# Patient Record
Sex: Female | Born: 1945
Health system: Southern US, Community
[De-identification: ages and names within clinical notes are randomized; demographics above are authoritative.]

## PROBLEM LIST (undated history)

## (undated) DIAGNOSIS — I4819 Other persistent atrial fibrillation: Secondary | ICD-10-CM

## (undated) DIAGNOSIS — E785 Hyperlipidemia, unspecified: Secondary | ICD-10-CM

## (undated) DIAGNOSIS — R51 Headache: Secondary | ICD-10-CM

## (undated) DIAGNOSIS — K5900 Constipation, unspecified: Secondary | ICD-10-CM

## (undated) DIAGNOSIS — J301 Allergic rhinitis due to pollen: Secondary | ICD-10-CM

## (undated) DIAGNOSIS — R519 Headache, unspecified: Secondary | ICD-10-CM

## (undated) DIAGNOSIS — M1990 Unspecified osteoarthritis, unspecified site: Secondary | ICD-10-CM

## (undated) DIAGNOSIS — M7730 Calcaneal spur, unspecified foot: Secondary | ICD-10-CM

## (undated) DIAGNOSIS — I10 Essential (primary) hypertension: Secondary | ICD-10-CM

## (undated) HISTORY — PX: ABDOMINAL HYSTERECTOMY: SHX81

## (undated) HISTORY — DX: Headache: R51

## (undated) HISTORY — PX: COLONOSCOPY: SHX174

## (undated) HISTORY — DX: Essential (primary) hypertension: I10

## (undated) HISTORY — DX: Allergic rhinitis due to pollen: J30.1

## (undated) HISTORY — DX: Other persistent atrial fibrillation: I48.19

## (undated) HISTORY — PX: HEMORRHOID SURGERY: SHX153

## (undated) HISTORY — DX: Headache, unspecified: R51.9

## (undated) HISTORY — DX: Hyperlipidemia, unspecified: E78.5

## (undated) HISTORY — PX: TOOTH EXTRACTION: SUR596

---

## 1981-02-02 HISTORY — PX: BACK SURGERY: SHX140

## 2005-05-06 ENCOUNTER — Ambulatory Visit: Payer: Self-pay | Admitting: Nurse Practitioner

## 2007-11-03 ENCOUNTER — Ambulatory Visit: Payer: Self-pay | Admitting: *Deleted

## 2009-05-21 LAB — HM COLONOSCOPY: HM Colonoscopy: NEGATIVE

## 2009-07-10 ENCOUNTER — Ambulatory Visit: Payer: Self-pay | Admitting: Gastroenterology

## 2009-07-10 LAB — HM COLONOSCOPY: HM COLON: NEGATIVE

## 2009-07-17 ENCOUNTER — Ambulatory Visit: Payer: Self-pay | Admitting: *Deleted

## 2010-05-21 ENCOUNTER — Encounter: Payer: Self-pay | Admitting: *Deleted

## 2010-06-03 ENCOUNTER — Encounter: Payer: Self-pay | Admitting: *Deleted

## 2010-07-30 ENCOUNTER — Ambulatory Visit: Payer: Self-pay | Admitting: *Deleted

## 2011-10-05 ENCOUNTER — Emergency Department: Payer: Self-pay | Admitting: Emergency Medicine

## 2011-12-22 ENCOUNTER — Ambulatory Visit: Payer: Self-pay | Admitting: *Deleted

## 2012-02-24 ENCOUNTER — Encounter: Payer: Self-pay | Admitting: *Deleted

## 2012-03-05 ENCOUNTER — Encounter: Payer: Self-pay | Admitting: *Deleted

## 2012-04-02 ENCOUNTER — Encounter: Payer: Self-pay | Admitting: *Deleted

## 2013-03-23 ENCOUNTER — Encounter: Payer: Self-pay | Admitting: Family Medicine

## 2013-03-23 ENCOUNTER — Ambulatory Visit (INDEPENDENT_AMBULATORY_CARE_PROVIDER_SITE_OTHER): Payer: Medicare HMO | Admitting: Family Medicine

## 2013-03-23 VITALS — BP 124/78 | HR 75 | Temp 97.7°F | Ht 63.0 in | Wt 159.0 lb

## 2013-03-23 DIAGNOSIS — Z1239 Encounter for other screening for malignant neoplasm of breast: Secondary | ICD-10-CM

## 2013-03-23 DIAGNOSIS — G5600 Carpal tunnel syndrome, unspecified upper limb: Secondary | ICD-10-CM | POA: Insufficient documentation

## 2013-03-23 DIAGNOSIS — I10 Essential (primary) hypertension: Secondary | ICD-10-CM | POA: Insufficient documentation

## 2013-03-23 DIAGNOSIS — E785 Hyperlipidemia, unspecified: Secondary | ICD-10-CM | POA: Insufficient documentation

## 2013-03-23 DIAGNOSIS — M1990 Unspecified osteoarthritis, unspecified site: Secondary | ICD-10-CM

## 2013-03-23 LAB — COMPREHENSIVE METABOLIC PANEL
ALT: 24 U/L (ref 0–35)
AST: 25 U/L (ref 0–37)
Albumin: 4.5 g/dL (ref 3.5–5.2)
Alkaline Phosphatase: 63 U/L (ref 39–117)
BUN: 31 mg/dL — ABNORMAL HIGH (ref 6–23)
CALCIUM: 10.1 mg/dL (ref 8.4–10.5)
CHLORIDE: 99 meq/L (ref 96–112)
CO2: 28 meq/L (ref 19–32)
CREATININE: 1.1 mg/dL (ref 0.4–1.2)
GFR: 53.99 mL/min — AB (ref >60.00)
Glucose, Bld: 95 mg/dL (ref 70–99)
Potassium: 3.7 mEq/L (ref 3.5–5.1)
Sodium: 137 mEq/L (ref 135–145)
TOTAL PROTEIN: 7.7 g/dL (ref 6.0–8.3)
Total Bilirubin: 0.5 mg/dL (ref 0.3–1.2)

## 2013-03-23 LAB — LIPID PANEL
Cholesterol: 205 mg/dL — ABNORMAL HIGH (ref 0–200)
HDL: 55.8 mg/dL (ref >39.00)
Total CHOL/HDL Ratio: 4
Triglycerides: 177 mg/dL — ABNORMAL HIGH (ref 0.0–149.0)
VLDL: 35.4 mg/dL (ref 0.0–40.0)

## 2013-03-23 NOTE — Assessment & Plan Note (Signed)
Followed by Dr. Sabra Heck.

## 2013-03-23 NOTE — Progress Notes (Signed)
   Subjective:   Patient ID: Monica Lindsey, female    DOB: [birth year], 68 y.o.   MRN: [id number]  Monica Lindsey is a pleasant 68 y.o. year old female who presentsyear old female who presents to clinic today with [place]  on 03/23/2013  HPI: HTN- on HCTZ 25 mg and amlodipine 5 mg daily- not taking any potassium supplementation. Eats a lot of bananas.  HLD- on pravastatin 40 mg daily.  Denies myalgias.  Tries to eat right- eats a lot of fruits and vegetables.  Widowed.  Still working 3 days per week.  Very close to her children and grand children.  Patient Active Problem List   Diagnosis Date Noted  . HTN (hypertension) 03/23/2013  . HLD (hyperlipidemia) 03/23/2013  . OA (osteoarthritis) 03/23/2013  . Carpal tunnel syndrome 03/23/2013   Past Medical History  Diagnosis Date  . Frequent headaches   . Hay fever   . Hypertension   . Hyperlipidemia    Past Surgical History  Procedure Laterality Date  . Back surgery  1983  . Hemorrhoid surgery    . Abdominal hysterectomy    . Tooth extraction     History  Substance Use Topics  . Smoking status: Never Smoker   . Smokeless tobacco: Never Used  . Alcohol Use: No   Family History  Problem Relation Age of Onset  . Heart disease Mother   . Alcohol abuse Father   . Arthritis Father   . Heart disease Father   . Stroke Father   . Hypertension Father    Allergies  Allergen Reactions  . Demerol [Meperidine] Other (See Comments)    Makes her "looney"   No current outpatient prescriptions on file prior to visit.   No current facility-administered medications on file prior to visit.   The PMH, PSH, Social History, Family History, Medications, and allergies have been reviewed in [facility], and have been updated if relevant.    Review of Systems See HPI No CP or SOB No LE edema No HA    Objective:    BP 124/78  Pulse 75  Temp(Src) 97.7 F (36.5 C) (Oral)  Ht 5\' 3"  (1.6 m)  Wt 159 lb (72.122 kg)  BMI 28.17 kg/m2  SpO2 97%   Physical  Exam  General:  Well-developed,well-nourished,in no acute distress; alert,appropriate and cooperative throughout examination Head:  normocephalic and atraumatic.   Eyes:  vision grossly intact, pupils equal, pupils round, and pupils reactive to light.   Ears:  R ear normal and L ear normal.   Lungs:  Normal respiratory effort, chest expands symmetrically. Lungs are clear to auscultation, no crackles or wheezes. Heart:  Normal rate and regular rhythm. S1 and S2 normal without gallop, murmur, click, rub or other extra sounds. Abdomen:  Bowel sounds positive,abdomen soft and non-tender without masses, organomegaly or hernias noted. Msk:  No deformity or scoliosis noted of thoracic or lumbar spine.   Extremities:  No clubbing, cyanosis, edema, or deformity noted with normal full range of motion of all joints.   Neurologic:  alert & oriented X3 and gait normal.   Skin:  Intact without suspicious lesions or rashes Psych:  Cognition and judgment appear intact. Alert and cooperative with normal attention span and concentration. No apparent delusions, illusions, hallucinations        Assessment & Plan:   HTN (hypertension)  HLD (hyperlipidemia) No Follow-up on file.

## 2013-03-23 NOTE — Progress Notes (Signed)
Pre visit review using our clinic review tool, if applicable. No additional management support is needed unless otherwise documented below in the visit note. 

## 2013-03-23 NOTE — Assessment & Plan Note (Signed)
On pravastatin 40 mg daily. Check lipid panel today. If controlled, will decrease pravastatin to 20 mg daily. The patient indicates understanding of these issues and agrees with the plan.

## 2013-03-23 NOTE — Patient Instructions (Addendum)
It was nice to meet you. We will call you with your lab results- we can likely back down on your cholesterol medication.   You can also view your results online.  Please call [facility] to set up your mammogram.  Please schedule your annual wellness visit at your convenience.

## 2013-03-23 NOTE — Assessment & Plan Note (Signed)
Well controlled on current rx. No changes. 

## 2013-03-24 ENCOUNTER — Telehealth: Payer: Self-pay | Admitting: Family Medicine

## 2013-03-24 ENCOUNTER — Encounter: Payer: Self-pay | Admitting: *Deleted

## 2013-03-24 LAB — LDL CHOLESTEROL, DIRECT: LDL DIRECT: 127.3 mg/dL

## 2013-03-24 NOTE — Telephone Encounter (Signed)
Relevant patient education assigned to patient using Emmi. ° °

## 2013-03-31 ENCOUNTER — Encounter: Payer: Self-pay | Admitting: Family Medicine

## 2013-04-10 ENCOUNTER — Telehealth: Payer: Self-pay

## 2013-04-10 MED ORDER — PRAVASTATIN SODIUM 40 MG PO TABS: 40.0000 mg | ORAL_TABLET | Freq: Every evening | ORAL | Status: DC

## 2013-04-10 MED ORDER — AMLODIPINE BESYLATE 5 MG PO TABS: 5.0000 mg | ORAL_TABLET | Freq: Every day | ORAL | Status: DC

## 2013-04-10 MED ORDER — HYDROCHLOROTHIAZIDE 25 MG PO TABS: 25.0000 mg | ORAL_TABLET | Freq: Every day | ORAL | Status: DC

## 2013-04-10 NOTE — Telephone Encounter (Addendum)
Pt left v/m requesting refill; left v/m for pt to cb and leave name of med and pharmacy.pt called back and request refill all meds until seen 05/17/13 for CPX/

## 2013-05-10 NOTE — Telephone Encounter (Signed)
Pt request status of refill on pts 3 meds; advised pt she should have refill at [facility]; pt looked at med and she does have available refill; pt will contact pharmacy.

## 2013-05-17 ENCOUNTER — Encounter: Payer: Self-pay | Admitting: Family Medicine

## 2013-05-17 ENCOUNTER — Ambulatory Visit (INDEPENDENT_AMBULATORY_CARE_PROVIDER_SITE_OTHER): Payer: Commercial Managed Care - HMO | Admitting: Family Medicine

## 2013-05-17 VITALS — BP 122/70 | HR 64 | Temp 97.7°F | Ht 63.0 in | Wt 197.0 lb

## 2013-05-17 DIAGNOSIS — Z0000 Encounter for general adult medical examination without abnormal findings: Secondary | ICD-10-CM

## 2013-05-17 DIAGNOSIS — E669 Obesity, unspecified: Secondary | ICD-10-CM

## 2013-05-17 DIAGNOSIS — R635 Abnormal weight gain: Secondary | ICD-10-CM

## 2013-05-17 DIAGNOSIS — I10 Essential (primary) hypertension: Secondary | ICD-10-CM

## 2013-05-17 DIAGNOSIS — Z23 Encounter for immunization: Secondary | ICD-10-CM

## 2013-05-17 DIAGNOSIS — E785 Hyperlipidemia, unspecified: Secondary | ICD-10-CM

## 2013-05-17 DIAGNOSIS — M1990 Unspecified osteoarthritis, unspecified site: Secondary | ICD-10-CM

## 2013-05-17 NOTE — Assessment & Plan Note (Signed)
No changes

## 2013-05-17 NOTE — Assessment & Plan Note (Signed)
Discussed options.  She feels she eat rights.  Discussed ways she can increase physical activity.

## 2013-05-17 NOTE — Addendum Note (Signed)
Addended by: Modena Nunnery on: 05/17/2013 12:21 PM   Modules accepted: Orders

## 2013-05-17 NOTE — Progress Notes (Signed)
Pre visit review using our clinic review tool, if applicable. No additional management support is needed unless otherwise documented below in the visit note. 

## 2013-05-17 NOTE — Patient Instructions (Signed)
Good to see you. Please tell [name] to make an appointment with me.

## 2013-05-17 NOTE — Assessment & Plan Note (Signed)
The patients weight, height, BMI and visual acuity have been recorded in the chart I have made referrals, counseling and provided education to the patient based review of the above and I have provided the pt with a written personalized care plan for preventive services.  Prevnar today.

## 2013-05-17 NOTE — Assessment & Plan Note (Signed)
Well controlled. No changes. 

## 2013-05-17 NOTE — Progress Notes (Signed)
Subjective:   Patient ID: Monica Lindsey, female    DOB: [birth year], 68 y.o.   MRN: [id number]  Tiasha Helvie is a pleasant 68 y.o. year old female who presents to clinic today with Annual Exam  on 05/17/2013  HPI: Established care with me in 03/2013.  I have personally reviewed the Medicare Annual Wellness questionnaire and have noted 1. The patient's medical and social history 2. Their use of alcohol, tobacco or illicit drugs 3. Their current medications and supplements 4. The patient's functional ability including ADL's, fall risks, home safety risks and hearing or visual             impairment. 5. Diet and physical activities 6. Evidence for depression or mood disorders  End of life wishes discussed and updated in Social History.  Remote h/o hysterectomy Colonoscopy 07/10/09- Dr. Candace Cruise- 10 year recall Zostavax 10/27/07 Pneumovax 03/06/09 mammogram 07/30/10- has appt for mammogram on 05/30/2013.  HTN- on HCTZ 25 mg and amlodipine 5 mg daily- not taking any potassium supplementation. Eats a lot of bananas. Lab Results  Component Value Date   NA 137 03/23/2013   K 3.7 03/23/2013   CL 99 03/23/2013   CO2 28 03/23/2013   Lab Results  Component Value Date   CREATININE 1.1 03/23/2013     HLD- on pravastatin 40 mg daily.  Denies myalgias. Lab Results  Component Value Date   CHOL 205* 03/23/2013   HDL 55.80 03/23/2013   LDLDIRECT 127.3 03/23/2013   TRIG 177.0* 03/23/2013   CHOLHDL 4 03/23/2013     Tries to eat right- eats a lot of fruits and vegetables.    Patient Active Problem List   Diagnosis Date Noted  . Medicare annual wellness visit, subsequent 05/17/2013  . HTN (hypertension) 03/23/2013  . HLD (hyperlipidemia) 03/23/2013  . OA (osteoarthritis) 03/23/2013  . Carpal tunnel syndrome 03/23/2013   Past Medical History  Diagnosis Date  . Frequent headaches   . Hay fever   . Hypertension   . Hyperlipidemia    Past Surgical History  Procedure Laterality Date  . Back  surgery  1983  . Hemorrhoid surgery    . Abdominal hysterectomy    . Tooth extraction     History  Substance Use Topics  . Smoking status: Never Smoker   . Smokeless tobacco: Never Used  . Alcohol Use: No   Family History  Problem Relation Age of Onset  . Heart disease Mother   . Alcohol abuse Father   . Arthritis Father   . Heart disease Father   . Stroke Father   . Hypertension Father    Allergies  Allergen Reactions  . Demerol [Meperidine] Other (See Comments)    Makes her "looney"   Current Outpatient Prescriptions on File Prior to Visit  Medication Sig Dispense Refill  . amLODipine (NORVASC) 5 MG tablet Take 1 tablet (5 mg total) by mouth daily.  30 tablet  1  . hydrochlorothiazide (HYDRODIURIL) 25 MG tablet Take 1 tablet (25 mg total) by mouth daily.  30 tablet  1  . pravastatin (PRAVACHOL) 40 MG tablet Take 1 tablet (40 mg total) by mouth every evening.  30 tablet  1   No current facility-administered medications on file prior to visit.   The PMH, PSH, Social History, Family History, Medications, and allergies have been reviewed in [facility], and have been updated if relevant.    Review of Systems See HPI No CP or SOB No LE edema No HA No blood in  stool No changes in her bowel habits Denies anxiety or depression    She is concerned about her weight. Wt Readings from Last 3 Encounters:  05/17/13 197 lb (89.359 kg)  03/23/13 159 lb (72.122 kg)   Not exercising enough and tends to stress eat. Objective:    BP 122/70  Pulse 64  Temp(Src) 97.7 F (36.5 C) (Oral)  Ht 5\' 3"  (1.6 m)  Wt 197 lb (89.359 kg)  BMI 34.91 kg/m2  SpO2 98%   Physical Exam  General:  Well-developed,well-nourished,in no acute distress; alert,appropriate and cooperative throughout examination Head:  normocephalic and atraumatic.   Eyes:  vision grossly intact, pupils equal, pupils round, and pupils reactive to light.   Ears:  R ear normal and L ear normal.   Lungs:  Normal  respiratory effort, chest expands symmetrically. Lungs are clear to auscultation, no crackles or wheezes. Heart:  Normal rate and regular rhythm. S1 and S2 normal without gallop, murmur, click, rub or other extra sounds. Abdomen:  Bowel sounds positive,abdomen soft and non-tender without masses, organomegaly or hernias noted. Msk:  No deformity or scoliosis noted of thoracic or lumbar spine.   Extremities:  No clubbing, cyanosis, edema, or deformity noted with normal full range of motion of all joints.   Neurologic:  alert & oriented X3 and gait normal.   Skin:  Intact without suspicious lesions or rashes Psych:  Cognition and judgment appear intact. Alert and cooperative with normal attention span and concentration. No apparent delusions, illusions, hallucinations        Assessment & Plan:   Medicare annual wellness visit, subsequent  OA (osteoarthritis)  HTN (hypertension)  HLD (hyperlipidemia) No Follow-up on file.

## 2013-05-29 ENCOUNTER — Telehealth: Payer: Self-pay

## 2013-05-29 MED ORDER — HYDROCHLOROTHIAZIDE 25 MG PO TABS
25.0000 mg | ORAL_TABLET | Freq: Every day | ORAL | Status: DC
Start: 2013-05-29 — End: 2013-12-04

## 2013-05-29 MED ORDER — AMLODIPINE BESYLATE 5 MG PO TABS: 5.0000 mg | ORAL_TABLET | Freq: Every day | ORAL | Status: DC

## 2013-05-29 MED ORDER — PRAVASTATIN SODIUM 40 MG PO TABS: 40.0000 mg | ORAL_TABLET | Freq: Every evening | ORAL | Status: DC

## 2013-05-29 NOTE — Telephone Encounter (Signed)
Spoke to pt and advised per Dr Aron. Rx sent to requested pharmacy 

## 2013-05-29 NOTE — Telephone Encounter (Signed)
Yes she should continue taking pravastatin.  Ok to refill.

## 2013-05-29 NOTE — Telephone Encounter (Signed)
Pt request 90 day refills for amlodipine and HCTZ to [facility]; advised pt done. Pt has not taken pravastatin for 2 weeks since ran out of medication; pt wants to know if she should continue taking pravastatin; if pt is to continue pravastatin pt request refill to [facility].pt request cb.

## 2013-05-30 ENCOUNTER — Ambulatory Visit: Payer: Self-pay | Admitting: Family Medicine

## 2013-05-31 ENCOUNTER — Encounter: Payer: Self-pay | Admitting: Family Medicine

## 2013-06-23 ENCOUNTER — Ambulatory Visit (INDEPENDENT_AMBULATORY_CARE_PROVIDER_SITE_OTHER): Payer: Commercial Managed Care - HMO | Admitting: Family Medicine

## 2013-06-23 ENCOUNTER — Ambulatory Visit (INDEPENDENT_AMBULATORY_CARE_PROVIDER_SITE_OTHER): Payer: Medicare HMO | Admitting: Family Medicine

## 2013-06-23 ENCOUNTER — Encounter: Payer: Self-pay | Admitting: Family Medicine

## 2013-06-23 VITALS — BP 126/80 | HR 86 | Temp 98.1°F | Wt 193.0 lb

## 2013-06-23 DIAGNOSIS — H6980 Other specified disorders of Eustachian tube, unspecified ear: Secondary | ICD-10-CM

## 2013-06-23 DIAGNOSIS — M79646 Pain in unspecified finger(s): Secondary | ICD-10-CM

## 2013-06-23 DIAGNOSIS — M79609 Pain in unspecified limb: Secondary | ICD-10-CM

## 2013-06-23 DIAGNOSIS — H6990 Unspecified Eustachian tube disorder, unspecified ear: Secondary | ICD-10-CM

## 2013-06-23 MED ORDER — FLUTICASONE PROPIONATE 50 MCG/ACT NA SUSP: 2.0000 | Freq: Every day | NASAL | Status: DC

## 2013-06-23 NOTE — Patient Instructions (Signed)
Use the flonase for now.  Drink plenty of fluids, take tylenol as needed, and gargle with warm salt water for your throat.  This should gradually improve.  Take care.  Let us know if you have other concerns.

## 2013-06-23 NOTE — Progress Notes (Signed)
Pre visit review using our clinic review tool, if applicable. No additional management support is needed unless otherwise documented below in the visit note.   Worked in the yard Tuesday.  Wednesday sx started.  Cough.  Out of work today.  No FCNAV. Cough and sneeze.  Rhinorrhea.  Throat clearing.  Some discolored sputum.  Tried nasal saline, with a little relief.   [place] on L thumb Tuesday.  Braced in the meantime (also prev injected by ortho) and some relief now.  No bruising or swelling.    Meds, vitals, and allergies reviewed.   ROS: See HPI.  Otherwise, noncontributory.  GEN: nad, alert and oriented HEENT: mucous membranes moist, tm w/o erythema, nasal exam w/o erythema but B SOM noted, clear discharge noted,  OP with cobblestoning, B ETD on exam noted.  Sinuses not ttp x4.   NECK: supple w/o LA CV: rrr.   PULM: ctab, no inc wob EXT: no edema SKIN: no acute rash L thumb not ttp but mild pain on extensor tendon testing w/o failure.  Snuffbox not ttp. Not ttp at the wrist or bony prominences.  NV intact

## 2013-06-27 DIAGNOSIS — H6980 Other specified disorders of Eustachian tube, unspecified ear: Secondary | ICD-10-CM | POA: Insufficient documentation

## 2013-06-27 DIAGNOSIS — M79646 Pain in unspecified finger(s): Secondary | ICD-10-CM | POA: Insufficient documentation

## 2013-06-27 NOTE — Assessment & Plan Note (Signed)
Already improved with splinting, this was a mild flare of a prev problem that doesn't need additional intervention, other than continued splinting.

## 2013-06-27 NOTE — Assessment & Plan Note (Signed)
D/w pt. Nontoxic.  Use flonase and f/u prn.

## 2013-12-04 ENCOUNTER — Other Ambulatory Visit: Payer: Self-pay | Admitting: *Deleted

## 2013-12-04 MED ORDER — HYDROCHLOROTHIAZIDE 25 MG PO TABS: 25.0000 mg | ORAL_TABLET | Freq: Every day | ORAL | Status: DC

## 2013-12-05 ENCOUNTER — Other Ambulatory Visit: Payer: Self-pay | Admitting: *Deleted

## 2013-12-05 NOTE — Telephone Encounter (Signed)
Fax received requesting medication refill for amlodipine and pravastatin. Lm on pts vm informing her an OV is required with labs for additional refills.

## 2013-12-06 ENCOUNTER — Ambulatory Visit (INDEPENDENT_AMBULATORY_CARE_PROVIDER_SITE_OTHER): Payer: Commercial Managed Care - HMO

## 2013-12-06 ENCOUNTER — Telehealth: Payer: Self-pay | Admitting: Family Medicine

## 2013-12-06 DIAGNOSIS — Z23 Encounter for immunization: Secondary | ICD-10-CM

## 2013-12-06 MED ORDER — HYDROCHLOROTHIAZIDE 25 MG PO TABS: 25.0000 mg | ORAL_TABLET | Freq: Every day | ORAL | Status: DC

## 2013-12-06 NOTE — Telephone Encounter (Signed)
Pt requesting #10 sent to pharmacy. Pt advised she will have to pay out of pocket. Pt advised to confirm that the mail order Rx is on its way before picking up at local pharmacy to ensure insurance will cover larger order.

## 2013-12-06 NOTE — Telephone Encounter (Signed)
Pt came in and needs a 10 day supply of hydrodiuril to a local pharmacy [facility] [address]   Pt is out of her meds

## 2013-12-18 ENCOUNTER — Other Ambulatory Visit: Payer: Self-pay | Admitting: *Deleted

## 2013-12-18 MED ORDER — AMLODIPINE BESYLATE 5 MG PO TABS: 5.0000 mg | ORAL_TABLET | Freq: Every day | ORAL | Status: DC

## 2013-12-18 MED ORDER — PRAVASTATIN SODIUM 40 MG PO TABS: 40.0000 mg | ORAL_TABLET | Freq: Every evening | ORAL | Status: DC

## 2013-12-19 ENCOUNTER — Encounter: Payer: Self-pay | Admitting: Family Medicine

## 2013-12-19 ENCOUNTER — Ambulatory Visit (INDEPENDENT_AMBULATORY_CARE_PROVIDER_SITE_OTHER): Payer: Medicare HMO | Admitting: Family Medicine

## 2013-12-19 VITALS — BP 116/78 | HR 65 | Temp 97.6°F | Wt 188.8 lb

## 2013-12-19 DIAGNOSIS — E785 Hyperlipidemia, unspecified: Secondary | ICD-10-CM

## 2013-12-19 DIAGNOSIS — I10 Essential (primary) hypertension: Secondary | ICD-10-CM

## 2013-12-19 NOTE — Progress Notes (Signed)
Subjective:   Patient ID: Monica Lindsey, female    DOB: 1945-08-11, 68 y.o.   MRN: [id number]  Monica Lindsey is a pleasant 68 y.o. year old female who presents to clinic today with Follow-up  on 12/19/2013  HPI: HTN- on HCTZ 25 mg and amlodipine 5 mg daily- not taking any potassium supplementation. Eats a lot of bananas.  Lab Results  Component Value Date   CREATININE 1.1 03/23/2013   Lab Results  Component Value Date   NA 137 03/23/2013   K 3.7 03/23/2013   CL 99 03/23/2013   CO2 28 03/23/2013     HLD- on pravastatin 40 mg daily.  Denies myalgias.  Lab Results  Component Value Date   CHOL 205* 03/23/2013   HDL 55.80 03/23/2013   LDLDIRECT 127.3 03/23/2013   TRIG 177.0* 03/23/2013   CHOLHDL 4 03/23/2013      Patient Active Problem List   Diagnosis Date Noted  . Thumb pain 06/27/2013  . ETD (eustachian tube dysfunction) 06/27/2013  . Medicare annual wellness visit, subsequent 05/17/2013  . Obesity, unspecified 05/17/2013  . HTN (hypertension) 03/23/2013  . HLD (hyperlipidemia) 03/23/2013  . OA (osteoarthritis) 03/23/2013  . Carpal tunnel syndrome 03/23/2013   Past Medical History  Diagnosis Date  . Frequent headaches   . Hay fever   . Hypertension   . Hyperlipidemia    Past Surgical History  Procedure Laterality Date  . Back surgery  1983  . Hemorrhoid surgery    . Abdominal hysterectomy    . Tooth extraction     History  Substance Use Topics  . Smoking status: Never Smoker   . Smokeless tobacco: Never Used  . Alcohol Use: No   Family History  Problem Relation Age of Onset  . Heart disease Mother   . Alcohol abuse Father   . Arthritis Father   . Heart disease Father   . Stroke Father   . Hypertension Father    Allergies  Allergen Reactions  . Demerol [Meperidine] Other (See Comments)    Makes her "looney"   Current Outpatient Prescriptions on File Prior to Visit  Medication Sig Dispense Refill  . amLODipine (NORVASC) 5 MG tablet  Take 1 tablet (5 mg total) by mouth daily. 90 tablet 1  . fluticasone (FLONASE) 50 MCG/ACT nasal spray Place 2 sprays into both nostrils daily. 16 g 1  . hydrochlorothiazide (HYDRODIURIL) 25 MG tablet Take 1 tablet (25 mg total) by mouth daily. 10 tablet 0  . pravastatin (PRAVACHOL) 40 MG tablet Take 1 tablet (40 mg total) by mouth every evening. 90 tablet 1   No current facility-administered medications on file prior to visit.   The PMH, PSH, Social History, Family History, Medications, and allergies have been reviewed in [facility], and have been updated if relevant.    Review of Systems See HPI No CP or SOB No LE edema No HA    Objective:    BP 116/78 mmHg  Pulse 65  Temp(Src) 97.6 F (36.4 C) (Oral)  Wt 188 lb 12 oz (85.616 kg)  SpO2 98%   Physical Exam  General:  Well-developed,well-nourished,in no acute distress; alert,appropriate and cooperative throughout examination Head:  normocephalic and atraumatic.   Eyes:  vision grossly intact, pupils equal, pupils round, and pupils reactive to light.   Ears:  R ear normal and L ear normal.   Lungs:  Normal respiratory effort, chest expands symmetrically. Lungs are clear to auscultation, no crackles or wheezes. Heart:  Normal  rate and regular rhythm. S1 and S2 normal without gallop, murmur, click, rub or other extra sounds. Abdomen:  Bowel sounds positive,abdomen soft and non-tender without masses, organomegaly or hernias noted. Msk:  No deformity or scoliosis noted of thoracic or lumbar spine.   Extremities:  No clubbing, cyanosis, edema, or deformity noted with normal full range of motion of all joints.   Neurologic:  alert & oriented X3 and gait normal.   Skin:  Intact without suspicious lesions or rashes Psych:  Cognition and judgment appear intact. Alert and cooperative with normal attention span and concentration. No apparent delusions, illusions, hallucinations        Assessment & Plan:   Essential hypertension  HLD  (hyperlipidemia) No Follow-up on file.

## 2013-12-19 NOTE — Patient Instructions (Signed)
Great to see you. We will call you with your lab results and you can view them online.  

## 2013-12-19 NOTE — Assessment & Plan Note (Signed)
Check labs today. Orders Placed This Encounter  Procedures  . Lipid panel  . Comprehensive metabolic panel

## 2013-12-19 NOTE — Assessment & Plan Note (Signed)
Well controlled on current rx. No changes made today. 

## 2013-12-19 NOTE — Progress Notes (Signed)
Pre visit review using our clinic review tool, if applicable. No additional management support is needed unless otherwise documented below in the visit note. 

## 2013-12-20 ENCOUNTER — Encounter: Payer: Self-pay | Admitting: *Deleted

## 2013-12-20 LAB — COMPREHENSIVE METABOLIC PANEL
ALT: 22 U/L (ref 0–35)
AST: 24 U/L (ref 0–37)
Albumin: 4.2 g/dL (ref 3.5–5.2)
Alkaline Phosphatase: 61 U/L (ref 39–117)
BILIRUBIN TOTAL: 0.7 mg/dL (ref 0.2–1.2)
BUN: 23 mg/dL (ref 6–23)
CO2: 26 mEq/L (ref 19–32)
Calcium: 9.4 mg/dL (ref 8.4–10.5)
Chloride: 104 mEq/L (ref 96–112)
Creatinine, Ser: 0.8 mg/dL (ref 0.4–1.2)
GFR: 74.28 mL/min (ref >60.00)
Glucose, Bld: 88 mg/dL (ref 70–99)
Potassium: 3.7 mEq/L (ref 3.5–5.1)
SODIUM: 138 meq/L (ref 135–145)
TOTAL PROTEIN: 6.9 g/dL (ref 6.0–8.3)

## 2013-12-20 LAB — LIPID PANEL
CHOL/HDL RATIO: 3
Cholesterol: 155 mg/dL (ref 0–200)
HDL: 53 mg/dL (ref >39.00)
LDL Cholesterol: 87 mg/dL (ref 0–99)
NONHDL: 102
Triglycerides: 74 mg/dL (ref 0.0–149.0)
VLDL: 14.8 mg/dL (ref 0.0–40.0)

## 2013-12-25 MED ORDER — PRAVASTATIN SODIUM 40 MG PO TABS: 40.0000 mg | ORAL_TABLET | Freq: Every evening | ORAL | Status: DC

## 2013-12-25 MED ORDER — AMLODIPINE BESYLATE 5 MG PO TABS: 5.0000 mg | ORAL_TABLET | Freq: Every day | ORAL | Status: DC

## 2013-12-25 NOTE — Addendum Note (Signed)
Addended by: Helene Shoe on: 12/25/2013 04:19 PM   Modules accepted: Orders

## 2013-12-25 NOTE — Telephone Encounter (Signed)
Pt said [employer] did not get response to refill request for amlodipine and pravastatin; I apologized to pt refills were sent to [facility] [address].I left v/m at [address] to d/c rx for amlodipine and pravastatin and sent meds to [facility] electronically. Pt voiced understanding.

## 2014-05-24 ENCOUNTER — Other Ambulatory Visit: Payer: Self-pay

## 2014-05-24 MED ORDER — PRAVASTATIN SODIUM 40 MG PO TABS: 40.0000 mg | ORAL_TABLET | Freq: Every evening | ORAL | Status: DC

## 2014-05-24 MED ORDER — AMLODIPINE BESYLATE 5 MG PO TABS: 5.0000 mg | ORAL_TABLET | Freq: Every day | ORAL | Status: DC

## 2014-05-24 MED ORDER — HYDROCHLOROTHIAZIDE 25 MG PO TABS: 25.0000 mg | ORAL_TABLET | Freq: Every day | ORAL | Status: DC

## 2014-05-24 NOTE — Telephone Encounter (Signed)
Pt request refill amlodipine, HCTZ and pravastatin to [facility]. Advised pt done.

## 2014-06-04 ENCOUNTER — Encounter: Payer: Self-pay | Admitting: Family Medicine

## 2014-06-04 ENCOUNTER — Ambulatory Visit (INDEPENDENT_AMBULATORY_CARE_PROVIDER_SITE_OTHER): Payer: Commercial Managed Care - HMO | Admitting: Family Medicine

## 2014-06-04 VITALS — BP 120/80 | HR 83 | Temp 98.0°F | Ht 63.0 in | Wt 189.8 lb

## 2014-06-04 DIAGNOSIS — M2240 Chondromalacia patellae, unspecified knee: Secondary | ICD-10-CM | POA: Diagnosis not present

## 2014-06-04 DIAGNOSIS — M19049 Primary osteoarthritis, unspecified hand: Secondary | ICD-10-CM

## 2014-06-04 DIAGNOSIS — M1810 Unilateral primary osteoarthritis of first carpometacarpal joint, unspecified hand: Secondary | ICD-10-CM

## 2014-06-04 NOTE — Progress Notes (Signed)
Dr. Frederico Hamman T. Erice Ahles, MD, [facility] [place], [address] Phone: [phone] Fax: [phone]  06/04/2014  Patient: Monica Lindsey, MRN: [id number], DOB: 1945-06-11, 69 y.o.  Primary Physician:  Arnette Norris, MD  Chief Complaint: Knee Pain and Thumb Pain  Subjective:   Monica Lindsey is a 69 y.o. very pleasant female patient who presents with the following:  Mostly thumbs. Has seen Earnestine Leys. 2-3 year ago had had a CMC OA in the both hands. And after discussion with her, she actually has more arthritic change in the greatest pain on her MCP joints rather than her CMC. She has arthritic changes that virtually all joints including her DIP joints in her PIP joints.  Knees B. she is not having extensive pain, but she is having some crepitus that she has noticed and was concerned and somewhat anxious about this. She has not had any swelling. No prior knee operations or trauma.  Past Medical History, Surgical History, Social History, Family History, Problem List, Medications, and Allergies have been reviewed and updated if relevant.  GEN: No fevers, chills. Nontoxic. Primarily MSK c/o today. MSK: Detailed in the HPI GI: tolerating PO intake without difficulty Neuro: No numbness, parasthesias, or tingling associated. Otherwise the pertinent positives of the ROS are noted above.   Objective:   BP 120/80 mmHg  Pulse 83  Temp(Src) 98 F (36.7 C) (Oral)  Ht 5\' 3"  (1.6 m)  Wt 189 lb 12 oz (86.07 kg)  BMI 33.62 kg/m2   GEN: WDWN, NAD, Non-toxic, Alert & Oriented x 3 HEENT: Atraumatic, Normocephalic.  Ears and Nose: No external deformity. EXTR: No clubbing/cyanosis/edema NEURO: Normal gait.  PSYCH: Normally interactive. Conversant. Not depressed or anxious appearing.  Calm demeanor.    Bilateral hands with extensive arthritic changes that virtually all joint, DIP, PIP, MCP as well as CMC joints of the first digits.  Most symptomatic at the right first digit MCP joint. Relatively preserved motion at the wrist.  Bilateral knees with full extension. Flexion to 130. The medial joint lines are mildly tender bilaterally. Nontender on the lateral joint lines. Good patellar motion with mild crepitus area.   Anterior cruciate ligament, PCL, MCL, and LCL are all stable. McMurray's causes no pain. Flexion pinch causes no pain.  Radiology: No results found.  Assessment and Plan:   Degenerative arthritis of thumb, unspecified laterality  Chondromalacia of patella, unspecified laterality [M22.40]  >25 minutes spent in face to face time with patient, >50% spent in counselling or coordination of care: She was having more of a flare last week, but her symptoms in her hands and knees: Down. She definitely has some significant hand arthritis. This will intermittently flare up and bother her off and on likely the rest of her life. She is having some crepitus at the knee, but overall her knees move like a person much younger than she is. I reassured her about all this.  Her greatest need is related to lose weight and get an the best shape that she can possibly get an.  Follow-up: Return if symptoms worsen or fail to improve.  Signed,  Maud Deed. Uchechi Denison, MD   Patient's Medications  New Prescriptions   No medications on file  Previous Medications   AMLODIPINE (NORVASC) 5 MG TABLET    Take 1 tablet (5 mg total) by mouth daily.   HYDROCHLOROTHIAZIDE (HYDRODIURIL) 25 MG TABLET    Take 1 tablet (25 mg total)  by mouth daily.   PRAVASTATIN (PRAVACHOL) 40 MG TABLET    Take 1 tablet (40 mg total) by mouth every evening.  Modified Medications   No medications on file  Discontinued Medications   FLUTICASONE (FLONASE) 50 MCG/ACT NASAL SPRAY    Place 2 sprays into both nostrils daily.

## 2014-06-04 NOTE — Progress Notes (Signed)
Pre visit review using our clinic review tool, if applicable. No additional management support is needed unless otherwise documented below in the visit note. 

## 2014-06-05 ENCOUNTER — Other Ambulatory Visit: Payer: Self-pay | Admitting: Family Medicine

## 2014-06-05 ENCOUNTER — Other Ambulatory Visit (INDEPENDENT_AMBULATORY_CARE_PROVIDER_SITE_OTHER): Payer: Commercial Managed Care - HMO

## 2014-06-05 DIAGNOSIS — Z0000 Encounter for general adult medical examination without abnormal findings: Secondary | ICD-10-CM | POA: Diagnosis not present

## 2014-06-05 DIAGNOSIS — E785 Hyperlipidemia, unspecified: Secondary | ICD-10-CM

## 2014-06-05 LAB — LIPID PANEL
Cholesterol: 163 mg/dL (ref 0–200)
HDL: 47.1 mg/dL (ref >39.00)
LDL Cholesterol: 101 mg/dL — ABNORMAL HIGH (ref 0–99)
NONHDL: 115.9
Total CHOL/HDL Ratio: 3
Triglycerides: 76 mg/dL (ref 0.0–149.0)
VLDL: 15.2 mg/dL (ref 0.0–40.0)

## 2014-06-05 LAB — CBC WITH DIFFERENTIAL/PLATELET
Basophils Absolute: 0 10*3/uL (ref 0.0–0.1)
Basophils Relative: 0.7 % (ref 0.0–3.0)
EOS ABS: 0.3 10*3/uL (ref 0.0–0.7)
Eosinophils Relative: 5.1 % — ABNORMAL HIGH (ref 0.0–5.0)
HCT: 42.7 % (ref 36.0–46.0)
HEMOGLOBIN: 14.6 g/dL (ref 12.0–15.0)
LYMPHS PCT: 39.9 % (ref 12.0–46.0)
Lymphs Abs: 2.1 10*3/uL (ref 0.7–4.0)
MCHC: 34.2 g/dL (ref 30.0–36.0)
MCV: 86.5 fl (ref 78.0–100.0)
Monocytes Absolute: 0.5 10*3/uL (ref 0.1–1.0)
Monocytes Relative: 8.7 % (ref 3.0–12.0)
NEUTROS ABS: 2.4 10*3/uL (ref 1.4–7.7)
Neutrophils Relative %: 45.6 % (ref 43.0–77.0)
PLATELETS: 222 10*3/uL (ref 150.0–400.0)
RBC: 4.93 Mil/uL (ref 3.87–5.11)
RDW: 13.2 % (ref 11.5–15.5)
WBC: 5.3 10*3/uL (ref 4.0–10.5)

## 2014-06-05 LAB — TSH: TSH: 1.52 u[IU]/mL (ref 0.35–4.50)

## 2014-06-05 LAB — COMPREHENSIVE METABOLIC PANEL
ALT: 18 U/L (ref 0–35)
AST: 19 U/L (ref 0–37)
Albumin: 4 g/dL (ref 3.5–5.2)
Alkaline Phosphatase: 62 U/L (ref 39–117)
BUN: 20 mg/dL (ref 6–23)
CALCIUM: 9.5 mg/dL (ref 8.4–10.5)
CHLORIDE: 105 meq/L (ref 96–112)
CO2: 29 meq/L (ref 19–32)
CREATININE: 1.01 mg/dL (ref 0.40–1.20)
GFR: 57.5 mL/min — AB (ref >60.00)
Glucose, Bld: 91 mg/dL (ref 70–99)
Potassium: 4.3 mEq/L (ref 3.5–5.1)
Sodium: 138 mEq/L (ref 135–145)
Total Bilirubin: 0.4 mg/dL (ref 0.2–1.2)
Total Protein: 6.9 g/dL (ref 6.0–8.3)

## 2014-06-11 ENCOUNTER — Encounter: Payer: Self-pay | Admitting: Family Medicine

## 2014-06-11 ENCOUNTER — Ambulatory Visit (INDEPENDENT_AMBULATORY_CARE_PROVIDER_SITE_OTHER): Payer: Commercial Managed Care - HMO | Admitting: Family Medicine

## 2014-06-11 VITALS — BP 126/76 | HR 64 | Temp 97.7°F | Ht 63.25 in | Wt 187.8 lb

## 2014-06-11 DIAGNOSIS — Z0000 Encounter for general adult medical examination without abnormal findings: Secondary | ICD-10-CM | POA: Diagnosis not present

## 2014-06-11 DIAGNOSIS — M1990 Unspecified osteoarthritis, unspecified site: Secondary | ICD-10-CM

## 2014-06-11 DIAGNOSIS — E785 Hyperlipidemia, unspecified: Secondary | ICD-10-CM

## 2014-06-11 DIAGNOSIS — I10 Essential (primary) hypertension: Secondary | ICD-10-CM

## 2014-06-11 NOTE — Progress Notes (Signed)
Subjective:   Patient ID: Monica Lindsey, female    DOB: 1945-03-21, 69 y.o.   MRN: [id number]  [name] is a pleasant 69 y.o. year old female who presents to clinic today with Annual Exam  on 06/11/2014  HPI:  I have personally reviewed the Medicare Annual Wellness questionnaire and have noted 1. The patient's medical and social history 2. Their use of alcohol, tobacco or illicit drugs 3. Their current medications and supplements 4. The patient's functional ability including ADL's, fall risks, home safety risks and hearing or visual             impairment. 5. Diet and physical activities 6. Evidence for depression or mood disorders  End of life wishes discussed and updated in Social History.  The roster of all physicians providing medical care to patient - is listed in the CareTeams section of the chart.    Remote h/o hysterectomy Colonoscopy 07/10/09- Dr. Candace Cruise- 10 year recall Zostavax 10/27/07 Pneumovax 03/06/09 Prevnar 13 05/17/13 Mammogram on 05/10/2013.  Very active.  Still working almost every day.  Enjoying her grandchildren as well.  Saw Dr. Lorelei Pont last week for arthritis in her thumb.  HTN- on HCTZ 25 mg and amlodipine 5 mg daily- not taking any potassium supplementation. Eats a lot of bananas. Lab Results  Component Value Date   NA 138 06/05/2014   K 4.3 06/05/2014   CL 105 06/05/2014   CO2 29 06/05/2014   Lab Results  Component Value Date   CREATININE 1.01 06/05/2014     HLD- on pravastatin 40 mg daily.  Denies myalgias. Lab Results  Component Value Date   CHOL 163 06/05/2014   HDL 47.10 06/05/2014   LDLCALC 101* 06/05/2014   LDLDIRECT 127.3 03/23/2013   TRIG 76.0 06/05/2014   CHOLHDL 3 06/05/2014     Lab Results  Component Value Date   CREATININE 1.01 06/05/2014   Lab Results  Component Value Date   ALT 18 06/05/2014   AST 19 06/05/2014   ALKPHOS 62 06/05/2014   BILITOT 0.4 06/05/2014   Lab Results  Component Value Date   NA 138  06/05/2014   K 4.3 06/05/2014   CL 105 06/05/2014   CO2 29 06/05/2014        Patient Active Problem List   Diagnosis Date Noted  . Thumb pain 06/27/2013  . ETD (eustachian tube dysfunction) 06/27/2013  . Medicare annual wellness visit, subsequent 05/17/2013  . Obesity, unspecified 05/17/2013  . HTN (hypertension) 03/23/2013  . HLD (hyperlipidemia) 03/23/2013  . OA (osteoarthritis) 03/23/2013  . Carpal tunnel syndrome 03/23/2013   Past Medical History  Diagnosis Date  . Frequent headaches   . Hay fever   . Hypertension   . Hyperlipidemia    Past Surgical History  Procedure Laterality Date  . Back surgery  1983  . Hemorrhoid surgery    . Abdominal hysterectomy    . Tooth extraction     History  Substance Use Topics  . Smoking status: Never Smoker   . Smokeless tobacco: Never Used  . Alcohol Use: No   Family History  Problem Relation Age of Onset  . Heart disease Mother   . Alcohol abuse Father   . Arthritis Father   . Heart disease Father   . Stroke Father   . Hypertension Father    Allergies  Allergen Reactions  . Demerol [Meperidine] Other (See Comments)    Makes her "looney"   Current Outpatient Prescriptions on File Prior  to Visit  Medication Sig Dispense Refill  . amLODipine (NORVASC) 5 MG tablet Take 1 tablet (5 mg total) by mouth daily. 90 tablet 1  . hydrochlorothiazide (HYDRODIURIL) 25 MG tablet Take 1 tablet (25 mg total) by mouth daily. 90 tablet 1  . pravastatin (PRAVACHOL) 40 MG tablet Take 1 tablet (40 mg total) by mouth every evening. 90 tablet 1   No current facility-administered medications on file prior to visit.   The PMH, PSH, Social History, Family History, Medications, and allergies have been reviewed in [facility], and have been updated if relevant.    Review of Systems  Constitutional: Negative.   HENT: Negative.   Eyes: Negative.   Respiratory: Negative.   Cardiovascular: Negative.   Gastrointestinal: Negative.   Endocrine:  Negative.   Genitourinary: Negative.   Musculoskeletal: Positive for arthralgias.  Allergic/Immunologic: Negative.   Neurological: Negative.   Hematological: Negative.   Psychiatric/Behavioral: Negative.   All other systems reviewed and are negative.     Objective:    BP 126/76 mmHg  Pulse 64  Temp(Src) 97.7 F (36.5 C) (Oral)  Ht 5' 3.25" (1.607 m)  Wt 187 lb 12 oz (85.163 kg)  BMI 32.98 kg/m2  SpO2 98%   Physical Exam  General:  Well-developed,well-nourished,in no acute distress; alert,appropriate and cooperative throughout examination Head:  normocephalic and atraumatic.   Eyes:  vision grossly intact, pupils equal, pupils round, and pupils reactive to light.   Ears:  R ear normal and L ear normal.   Lungs:  Normal respiratory effort, chest expands symmetrically. Lungs are clear to auscultation, no crackles or wheezes. Heart:  Normal rate and regular rhythm. S1 and S2 normal without gallop, murmur, click, rub or other extra sounds. Abdomen:  Bowel sounds positive,abdomen soft and non-tender without masses, organomegaly or hernias noted. Msk:  No deformity or scoliosis noted of thoracic or lumbar spine.   Extremities:  No clubbing, cyanosis, edema, or deformity noted with normal full range of motion of all joints.   Neurologic:  alert & oriented X3 and gait normal.   Skin:  Intact without suspicious lesions or rashes Psych:  Cognition and judgment appear intact. Alert and cooperative with normal attention span and concentration. No apparent delusions, illusions, hallucinations        Assessment & Plan:   Medicare annual wellness visit, subsequent  Essential hypertension  HLD (hyperlipidemia)  Osteoarthritis, unspecified osteoarthritis type, unspecified site No Follow-up on file.

## 2014-06-11 NOTE — Patient Instructions (Signed)
Great to see you. Happy Mother's Day!

## 2014-06-11 NOTE — Progress Notes (Signed)
Pre visit review using our clinic review tool, if applicable. No additional management support is needed unless otherwise documented below in the visit note. 

## 2014-06-19 ENCOUNTER — Other Ambulatory Visit: Payer: Self-pay | Admitting: Family Medicine

## 2014-06-19 DIAGNOSIS — Z1231 Encounter for screening mammogram for malignant neoplasm of breast: Secondary | ICD-10-CM

## 2014-07-03 ENCOUNTER — Ambulatory Visit: Payer: Commercial Managed Care - HMO

## 2014-07-05 ENCOUNTER — Ambulatory Visit
Admission: RE | Admit: 2014-07-05 | Discharge: 2014-07-05 | Disposition: A | Discharge status: Home / Self Care | Payer: Commercial Managed Care - HMO | Source: Ambulatory Visit | Attending: Family Medicine | Admitting: Family Medicine

## 2014-07-05 ENCOUNTER — Other Ambulatory Visit: Payer: Self-pay | Admitting: Family Medicine

## 2014-07-05 DIAGNOSIS — R922 Inconclusive mammogram: Secondary | ICD-10-CM | POA: Diagnosis not present

## 2014-07-05 DIAGNOSIS — Z1231 Encounter for screening mammogram for malignant neoplasm of breast: Secondary | ICD-10-CM | POA: Diagnosis not present

## 2014-10-02 ENCOUNTER — Other Ambulatory Visit: Payer: Self-pay

## 2014-10-02 MED ORDER — AMLODIPINE BESYLATE 5 MG PO TABS: 5.0000 mg | ORAL_TABLET | Freq: Every day | ORAL | Status: DC

## 2014-10-02 MED ORDER — HYDROCHLOROTHIAZIDE 25 MG PO TABS: 25.0000 mg | ORAL_TABLET | Freq: Every day | ORAL | Status: DC

## 2014-10-02 MED ORDER — PRAVASTATIN SODIUM 40 MG PO TABS: 40.0000 mg | ORAL_TABLET | Freq: Every evening | ORAL | Status: DC

## 2014-10-02 NOTE — Telephone Encounter (Signed)
Pt request refill # 15 each for amlodipine,HCTZ and pravastatin to [facility] [address] while waiting on mail order delivery. Advised pt done.

## 2014-10-10 ENCOUNTER — Ambulatory Visit (INDEPENDENT_AMBULATORY_CARE_PROVIDER_SITE_OTHER): Payer: Commercial Managed Care - HMO | Admitting: Family Medicine

## 2014-10-10 ENCOUNTER — Encounter: Payer: Self-pay | Admitting: Family Medicine

## 2014-10-10 VITALS — BP 120/80 | HR 68 | Temp 98.2°F | Wt 188.0 lb

## 2014-10-10 DIAGNOSIS — Z23 Encounter for immunization: Secondary | ICD-10-CM | POA: Diagnosis not present

## 2014-10-10 DIAGNOSIS — R300 Dysuria: Secondary | ICD-10-CM

## 2014-10-10 LAB — POCT URINALYSIS DIPSTICK
Bilirubin, UA: NEGATIVE
Blood, UA: NEGATIVE
Glucose, UA: NEGATIVE
Ketones, UA: NEGATIVE
Nitrite, UA: NEGATIVE
PH UA: 6
PROTEIN UA: NEGATIVE
SPEC GRAV UA: 1.025
UROBILINOGEN UA: NEGATIVE

## 2014-10-10 NOTE — Progress Notes (Signed)
Subjective:   Patient ID: Monica Lindsey, female    DOB: 1945/03/23, 69 y.o.   MRN: [id number]  Monica Lindsey is a pleasant 69 y.o. year old female who presents to clinic today with urinary issue  on 10/10/2014  HPI:  Dysuria, increased urinary frequency, fatigue for 3 weeks. No hematuria.  In past was told that she possibly has IC.  Had thorough urological work up years ago. No fevers.  No nausea or vomiting.  Saw Dr. Jacqlyn Larsen last 13 years ago.  Remote h/o hysterectomy.  Current Outpatient Prescriptions on File Prior to Visit  Medication Sig Dispense Refill  . amLODipine (NORVASC) 5 MG tablet Take 1 tablet (5 mg total) by mouth daily. 15 tablet 0  . hydrochlorothiazide (HYDRODIURIL) 25 MG tablet Take 1 tablet (25 mg total) by mouth daily. 15 tablet 0  . pravastatin (PRAVACHOL) 40 MG tablet Take 1 tablet (40 mg total) by mouth every evening. 15 tablet 0   No current facility-administered medications on file prior to visit.    Allergies  Allergen Reactions  . Demerol [Meperidine] Other (See Comments)    Makes her "looney"    Past Medical History  Diagnosis Date  . Frequent headaches   . Hay fever   . Hypertension   . Hyperlipidemia     Past Surgical History  Procedure Laterality Date  . Back surgery  1983  . Hemorrhoid surgery    . Abdominal hysterectomy    . Tooth extraction      Family History  Problem Relation Age of Onset  . Heart disease Mother   . Alcohol abuse Father   . Arthritis Father   . Heart disease Father   . Stroke Father   . Hypertension Father   . Breast cancer Paternal Aunt 18    Social History   Social History  . Marital Status: Widowed    Spouse Name: N/A  . Number of Children: N/A  . Years of Education: N/A   Occupational History  . Not on file.   Social History Main Topics  . Smoking status: Never Smoker   . Smokeless tobacco: Never Used  . Alcohol Use: No  . Drug Use: No  . Sexual Activity: No   Other Topics Concern    . 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   Widowed.      Still working 3 days per week.  Very close to her children and grand children.      Daughter, Monica Lindsey, is HPOA.  Has a living will.   Would desire CPR, would not desire prolonged life support if futile.            The PMH, PSH, Social History, Family History, Medications, and allergies have been reviewed in [facility], and have been updated if relevant.   Review of Systems  Constitutional: Negative.   Gastrointestinal: Negative.   Genitourinary: Positive for dysuria, frequency and difficulty urinating. Negative for hematuria, flank pain, enuresis, genital sores, menstrual problem and pelvic pain.  Hematological: Negative.   Psychiatric/Behavioral: Negative.        Objective:    BP 120/80 mmHg  Pulse 68  Wt 188 lb (85.276 kg)  SpO2 98%   Physical Exam  Constitutional: She is oriented to person, place, and time. She appears well-developed and well-nourished. No distress.  HENT:  Head: Normocephalic.  Eyes: Conjunctivae are normal.  Cardiovascular: Normal rate.  Pulmonary/Chest: Effort normal.  Abdominal: Soft. She exhibits no distension. There is no tenderness. There is no rebound.  Musculoskeletal: She exhibits no edema.  Neurological: She is alert and oriented to person, place, and time. No cranial nerve deficit.  Skin: Skin is warm and dry.  Psychiatric: She has a normal mood and affect. Her behavior is normal. Judgment and thought content normal.          Assessment & Plan:   No diagnosis found. No Follow-up on file.

## 2014-10-10 NOTE — Addendum Note (Signed)
Addended by: Despina Hidden on: 10/10/2014 07:50 AM   Modules accepted: Orders

## 2014-10-10 NOTE — Assessment & Plan Note (Signed)
New- UA pos for LE only.  Since symptoms improving and will send urine for cx and await results prior to starting abx..  The patient indicates understanding of these issues and agrees with the plan.

## 2014-10-10 NOTE — Addendum Note (Signed)
Addended by: Modena Nunnery on: 10/10/2014 08:45 AM   Modules accepted: Orders

## 2014-10-10 NOTE — Progress Notes (Signed)
Pre visit review using our clinic review tool, if applicable. No additional management support is needed unless otherwise documented below in the visit note. 

## 2014-10-11 ENCOUNTER — Telehealth: Payer: Self-pay | Admitting: Family Medicine

## 2014-10-11 MED ORDER — CEPHALEXIN 500 MG PO CAPS: 500.0000 mg | ORAL_CAPSULE | Freq: Two times a day (BID) | ORAL | Status: DC

## 2014-10-11 NOTE — Telephone Encounter (Signed)
PLEASE NOTE: All timestamps contained within this report are represented as [place] Standard Time. CONFIDENTIALTY NOTICE: This fax transmission is intended only for the addressee. It contains information that is legally privileged, confidential or otherwise protected from use or disclosure. If you are not the intended recipient, you are strictly prohibited from reviewing, disclosing, copying using or disseminating any of this information or taking any action in reliance on or regarding this information. If you have received this fax in error, please notify us immediately by telephone so that we can arrange for its return to [place]. Phone: [phone], Toll-Free: [phone], Fax: [phone] Page: 1 of 1 Call Id: [id number] [place] Patient Name: Monica Lindsey Gender: Female DOB: [birth year] Age: 69 Y 10 M 18 D Return Phone Number: [phone] (Primary) Address: City/State/Zip: [place] Client [place] Night - Client Client Site [facility] Deborra Medina, [place] Contact Type Call Call Type Triage / Clinical Caller Name Jeannetta Relationship To Patient Self Return Phone Number [phone] (Primary) Chief Complaint Urination Pain Initial Comment Caller states she was seen for a bladder prescription. She wants something called in. Nurse Assessment Nurse: Raynelle Dick, RN, Jonita Albee Date/Time (Eastern Time): 10/11/2014 8:49:55 AM Confirm and document reason for call. If symptomatic, describe symptoms. ---Caller states that she was seen by Dr. Deborra Medina yesterday for a possible bladder infection. Caller states that she needs a prescription called in today, as her symptoms are getting worse. Has the patient traveled out of the country within the last 30 days? ---No Does the patient require triage? ---No Please document clinical information provided and list  any resource used. ---Transferred to office for medication request as seen in office yesterday and patient requesting medication to be called in. Guidelines Guideline Title Affirmed Question Affirmed Notes Nurse Date/Time (Effort Time) Disp. Time Eilene Ghazi Time) Disposition Final User 10/11/2014 9:00:12 AM Clinical Call Yes Humphrey, RN, Jonita Albee After Care Instructions Given Call Event Type User Date / Time Description

## 2014-10-11 NOTE — Telephone Encounter (Signed)
Pt did not sleep well last night and is in pain and would like an antibiotic for dysuria.  States she cannot wait until culture comes back, she has to work Friday and Saturday and would like to feel better.  Pt requests a callback when / if antibiotic called in to [facility] on [address] .  [phone]

## 2014-10-11 NOTE — Telephone Encounter (Signed)
This issue from [facility] has already been addressed per 10/11/14 phone note.

## 2014-10-11 NOTE — Telephone Encounter (Signed)
eRx sent to [facility].  I hope she feels better.

## 2014-10-11 NOTE — Telephone Encounter (Signed)
Pt notified Rx sent to pharmacy

## 2014-10-12 LAB — URINE CULTURE

## 2014-11-01 ENCOUNTER — Ambulatory Visit (INDEPENDENT_AMBULATORY_CARE_PROVIDER_SITE_OTHER): Payer: Commercial Managed Care - HMO | Admitting: Family Medicine

## 2014-11-01 ENCOUNTER — Encounter: Payer: Self-pay | Admitting: Family Medicine

## 2014-11-01 VITALS — BP 146/82 | HR 71 | Temp 98.2°F | Ht 66.0 in | Wt 191.2 lb

## 2014-11-01 DIAGNOSIS — M25551 Pain in right hip: Secondary | ICD-10-CM | POA: Diagnosis not present

## 2014-11-01 DIAGNOSIS — M7061 Trochanteric bursitis, right hip: Secondary | ICD-10-CM | POA: Diagnosis not present

## 2014-11-01 MED ORDER — METHYLPREDNISOLONE ACETATE 40 MG/ML IJ SUSP
80.0000 mg | Freq: Once | INTRAMUSCULAR | Status: AC
  Administered 2014-11-01: 80 mg via INTRA_ARTICULAR

## 2014-11-01 NOTE — Patient Instructions (Signed)
Hip Rehab:  Hip Flexion: Toe up to ceiling, laying on your back. Lift your whole leg, 3 sets. Work up to being able to do #30 with each set.  Hip elevations, Toe and leg turned out to side.  Lift whole leg, 3 sets. Work up to being able to do #30 with each set.  Hip Abductions: Lying on side, straight out to side. 3 sets, work up to being able to do #30 with each set.  At the beginning you may only be able to do a lot less, try to do #10.  

## 2014-11-01 NOTE — Progress Notes (Signed)
Dr. Frederico Hamman T. Amman Bartel, MD, [facility] [place], [address] Phone: [phone] Fax: [phone]  11/01/2014  Patient: Monica Lindsey, MRN: [id number], DOB: [birth year], 69 y.o.  Primary Physician:  Arnette Norris, MD  Chief Complaint: Hip Pain  Subjective:   Monica Lindsey is a 69 y.o. very pleasant female patient who presents with the following:  Patient known 39 or 30 years who presents with right-sided lateral hip pain. She denies groin pain. She denies significant back pain. She has had some occasional intermittent knee pain. A lot of problems going up stairs.   2 years ago, has had a couple of bursitis injections.  R sided. R troch bursitis.   Past Medical History, Surgical History, Social History, Family History, Problem List, Medications, and Allergies have been reviewed and updated if relevant.  Patient Active Problem List   Diagnosis Date Noted  . Dysuria 10/10/2014  . Thumb pain 06/27/2013  . ETD (eustachian tube dysfunction) 06/27/2013  . Medicare annual wellness visit, subsequent 05/17/2013  . Obesity, unspecified 05/17/2013  . HTN (hypertension) 03/23/2013  . HLD (hyperlipidemia) 03/23/2013  . OA (osteoarthritis) 03/23/2013  . Carpal tunnel syndrome 03/23/2013    Past Medical History  Diagnosis Date  . Frequent headaches   . Hay fever   . Hypertension   . Hyperlipidemia     Past Surgical History  Procedure Laterality Date  . Back surgery  1983  . Hemorrhoid surgery    . Abdominal hysterectomy    . Tooth extraction      Social History   Social History  . Marital Status: Widowed    Spouse Name: N/A  . Number of Children: N/A  . Years of Education: N/A   Occupational History  . Not on file.   Social History Main Topics  . Smoking status: Never Smoker   . Smokeless tobacco: Never Used  . Alcohol Use: No  . Drug Use: No  . Sexual Activity: No   Other Topics Concern  . 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   Widowed.      Still working 3 days per week.  Very close to her children and grand children.      Daughter, [name], is HPOA.  Has a living will.   Would desire CPR, would not desire prolonged life support if futile.             Family History  Problem Relation Age of Onset  . Heart disease Mother   . Alcohol abuse Father   . Arthritis Father   . Heart disease Father   . Stroke Father   . Hypertension Father   . Breast cancer Paternal Aunt 60    Allergies  Allergen Reactions  . Demerol [Meperidine] Other (See Comments)    Makes her "looney"    Medication list reviewed and updated in full in [place].  GEN: No fevers, chills. Nontoxic. Primarily MSK c/o today. MSK: Detailed in the HPI GI: tolerating PO intake without difficulty Neuro: No numbness, parasthesias, or tingling associated. Otherwise the pertinent positives of the ROS are noted above.   Objective:   BP 146/82 mmHg  Pulse 71  Temp(Src) 98.2 F (36.8 C) (Oral)  Ht 5\' 6"  (1.676 m)  Wt 191 lb 4 oz (86.75 kg)  BMI 30.88 kg/m2   GEN: WDWN, NAD, Non-toxic, Alert &  Oriented x 3 HEENT: Atraumatic, Normocephalic.  Ears and Nose: No external deformity. EXTR: No clubbing/cyanosis/edema NEURO: Normal gait.  PSYCH: Normally interactive. Conversant. Not depressed or anxious appearing.  Calm demeanor.   HIP EXAM: SIDE: R ROM: Abduction, Flexion, Internal and External range of motion: full Pain with terminal IROM and EROM: none GTB: R GTB SLR: NEG Knees: No effusion FABER: NT REVERSE FABER: NT, neg Piriformis: NT at direct palpation Str: flexion: 4+/5 abduction: 4/5 adduction: 4+ /5 Strength testing non-tender     Radiology: No results found.  Assessment and Plan:   Trochanteric bursitis of right hip  Right hip pain - Plan: methylPREDNISolone acetate (DEPO-MEDROL) injection 80 mg  I reviewed  with the patient the structures involved and how they related to diagnosis.   Patient was given a rehabilitation protocol emphasizing core rehab, partcularly addressing abductor weakness, and stretching of the pelvic musculature, hips, and ITB.  Trochanteric bursa injections have clearly been shown to help with acute bursitis, and the patient would benefit.   Trochanteric Bursitis Injection, R Verbal consent obtained. Risks (including infection, potential atrophy), benefits, and alternatives reviewed. Greater trochanter sterilely prepped with Chloraprep. Ethyl Chloride used for anesthesia. 8 cc of Lidocaine 1% injected with Depo-Medrol 80 mg into trochanteric bursa at area of maximal tenderness at greater trochanter. Needle taken to bone to troch bursa, flows easily. Bursa massaged. No bleeding and no complications. Decreased pain after injection. Needle: 22 gauge, 2 inch  Patient Instructions  Hip Rehab:  Hip Flexion: Toe up to ceiling, laying on your back. Lift your whole leg, 3 sets. Work up to being able to do #30 with each set.  Hip elevations, Toe and leg turned out to side.  Lift whole leg, 3 sets. Work up to being able to do #30 with each set.  Hip Abductions: Lying on side, straight out to side. 3 sets, work up to being able to do #30 with each set.  At the beginning you may only be able to do a lot less, try to do #10.      Signed,  Maud Deed. Jerry Haugen, MD   Patient's Medications  New Prescriptions   No medications on file  Previous Medications   AMLODIPINE (NORVASC) 5 MG TABLET    Take 1 tablet (5 mg total) by mouth daily.   HYDROCHLOROTHIAZIDE (HYDRODIURIL) 25 MG TABLET    Take 1 tablet (25 mg total) by mouth daily.   PRAVASTATIN (PRAVACHOL) 40 MG TABLET    Take 1 tablet (40 mg total) by mouth every evening.  Modified Medications   No medications on file  Discontinued Medications   CEPHALEXIN (KEFLEX) 500 MG CAPSULE    Take 1 capsule (500 mg total) by mouth 2 (two)  times daily.

## 2014-11-01 NOTE — Progress Notes (Signed)
Pre visit review using our clinic review tool, if applicable. No additional management support is needed unless otherwise documented below in the visit note. 

## 2014-11-28 ENCOUNTER — Other Ambulatory Visit: Payer: Self-pay | Admitting: Family Medicine

## 2015-01-30 ENCOUNTER — Ambulatory Visit: Payer: Commercial Managed Care - HMO | Admitting: Family Medicine

## 2015-04-01 ENCOUNTER — Other Ambulatory Visit: Payer: Self-pay | Admitting: Family Medicine

## 2015-06-04 ENCOUNTER — Other Ambulatory Visit: Payer: Self-pay | Admitting: Family Medicine

## 2015-06-04 DIAGNOSIS — E785 Hyperlipidemia, unspecified: Secondary | ICD-10-CM

## 2015-06-04 DIAGNOSIS — Z0000 Encounter for general adult medical examination without abnormal findings: Secondary | ICD-10-CM

## 2015-06-05 ENCOUNTER — Other Ambulatory Visit: Payer: Self-pay | Admitting: Family Medicine

## 2015-06-05 DIAGNOSIS — Z1231 Encounter for screening mammogram for malignant neoplasm of breast: Secondary | ICD-10-CM

## 2015-06-06 ENCOUNTER — Other Ambulatory Visit: Payer: Self-pay | Admitting: Family Medicine

## 2015-06-11 ENCOUNTER — Other Ambulatory Visit (INDEPENDENT_AMBULATORY_CARE_PROVIDER_SITE_OTHER): Payer: Commercial Managed Care - HMO

## 2015-06-11 DIAGNOSIS — Z0000 Encounter for general adult medical examination without abnormal findings: Secondary | ICD-10-CM

## 2015-06-11 LAB — COMPREHENSIVE METABOLIC PANEL
ALT: 34 U/L (ref 0–35)
AST: 22 U/L (ref 0–37)
Albumin: 4.3 g/dL (ref 3.5–5.2)
Alkaline Phosphatase: 59 U/L (ref 39–117)
BUN: 25 mg/dL — AB (ref 6–23)
CALCIUM: 9.7 mg/dL (ref 8.4–10.5)
CHLORIDE: 102 meq/L (ref 96–112)
CO2: 30 meq/L (ref 19–32)
CREATININE: 0.79 mg/dL (ref 0.40–1.20)
GFR: 76.13 mL/min (ref >60.00)
Glucose, Bld: 98 mg/dL (ref 70–99)
POTASSIUM: 3.7 meq/L (ref 3.5–5.1)
SODIUM: 141 meq/L (ref 135–145)
Total Bilirubin: 0.5 mg/dL (ref 0.2–1.2)
Total Protein: 7 g/dL (ref 6.0–8.3)

## 2015-06-11 LAB — CBC WITH DIFFERENTIAL/PLATELET
BASOS PCT: 0.6 % (ref 0.0–3.0)
Basophils Absolute: 0 10*3/uL (ref 0.0–0.1)
EOS ABS: 0.2 10*3/uL (ref 0.0–0.7)
Eosinophils Relative: 3.4 % (ref 0.0–5.0)
HCT: 43.4 % (ref 36.0–46.0)
Hemoglobin: 14.6 g/dL (ref 12.0–15.0)
LYMPHS ABS: 2.3 10*3/uL (ref 0.7–4.0)
Lymphocytes Relative: 40.7 % (ref 12.0–46.0)
MCHC: 33.8 g/dL (ref 30.0–36.0)
MCV: 87.6 fl (ref 78.0–100.0)
MONO ABS: 0.4 10*3/uL (ref 0.1–1.0)
Monocytes Relative: 7.7 % (ref 3.0–12.0)
NEUTROS ABS: 2.7 10*3/uL (ref 1.4–7.7)
Neutrophils Relative %: 47.6 % (ref 43.0–77.0)
PLATELETS: 218 10*3/uL (ref 150.0–400.0)
RBC: 4.96 Mil/uL (ref 3.87–5.11)
RDW: 13.1 % (ref 11.5–15.5)
WBC: 5.7 10*3/uL (ref 4.0–10.5)

## 2015-06-11 LAB — LIPID PANEL
Cholesterol: 194 mg/dL (ref 0–200)
HDL: 52.5 mg/dL (ref >39.00)
LDL CALC: 121 mg/dL — AB (ref 0–99)
NonHDL: 141.73
TRIGLYCERIDES: 105 mg/dL (ref 0.0–149.0)
Total CHOL/HDL Ratio: 4
VLDL: 21 mg/dL (ref 0.0–40.0)

## 2015-06-11 LAB — TSH: TSH: 1.74 u[IU]/mL (ref 0.35–4.50)

## 2015-06-18 ENCOUNTER — Ambulatory Visit (INDEPENDENT_AMBULATORY_CARE_PROVIDER_SITE_OTHER): Payer: Commercial Managed Care - HMO | Admitting: Family Medicine

## 2015-06-18 ENCOUNTER — Encounter: Payer: Self-pay | Admitting: Family Medicine

## 2015-06-18 VITALS — BP 112/66 | HR 65 | Temp 97.7°F | Ht 63.25 in | Wt 198.0 lb

## 2015-06-18 DIAGNOSIS — I10 Essential (primary) hypertension: Secondary | ICD-10-CM

## 2015-06-18 DIAGNOSIS — E669 Obesity, unspecified: Secondary | ICD-10-CM | POA: Diagnosis not present

## 2015-06-18 DIAGNOSIS — Z0000 Encounter for general adult medical examination without abnormal findings: Secondary | ICD-10-CM

## 2015-06-18 DIAGNOSIS — M1990 Unspecified osteoarthritis, unspecified site: Secondary | ICD-10-CM | POA: Diagnosis not present

## 2015-06-18 DIAGNOSIS — E785 Hyperlipidemia, unspecified: Secondary | ICD-10-CM | POA: Diagnosis not present

## 2015-06-18 NOTE — Assessment & Plan Note (Signed)
The patients weight, height, BMI and visual acuity have been recorded in the chart.  Cognitive function assessed.   I have made referrals, counseling and provided education to the patient based review of the above and I have provided the pt with a written personalized care plan for preventive services.  

## 2015-06-18 NOTE — Assessment & Plan Note (Signed)
Well controlled on current rx. No changes made today. 

## 2015-06-18 NOTE — Assessment & Plan Note (Signed)
At goal.  No changes made to rxs today. 

## 2015-06-18 NOTE — Patient Instructions (Signed)
Great to see you.  Please keep your appointment for your mammogram. 

## 2015-06-18 NOTE — Progress Notes (Signed)
Pre visit review using our clinic review tool, if applicable. No additional management support is needed unless otherwise documented below in the visit note. 

## 2015-06-18 NOTE — Assessment & Plan Note (Signed)
Followed by ortho. Reasonable pain control.

## 2015-06-18 NOTE — Progress Notes (Signed)
Subjective:   Patient ID: Monica Lindsey, female    DOB: 1945/05/31, 70 y.o.   MRN: [id number]  Monica Lindsey is a pleasant 70 y.o. year old female who presents to clinic today with Annual Exam  and follow up of chronic medical conditions on 06/18/2015  HPI:  I have personally reviewed the Medicare Annual Wellness questionnaire and have noted 1. The patient's medical and social history 2. Their use of alcohol, tobacco or illicit drugs 3. Their current medications and supplements 4. The patient's functional ability including ADL's, fall risks, home safety risks and hearing or visual             impairment. 5. Diet and physical activities 6. Evidence for depression or mood disorders  End of life wishes discussed and updated in Social History.  The roster of all physicians providing medical care to patient - is listed in the CareTeams section of the chart.    Remote h/o hysterectomy Colonoscopy 07/10/09- Dr. Candace Cruise- 10 year recall Zostavax 10/27/07 Pneumovax 03/06/09 Prevnar 13 05/17/13 Mammogram scheduled for 07/10/15  Very active.  Still working almost every day.  Enjoying her grandchildren as well.  She does feel her short term memory is worse but not severe.  HTN- on HCTZ 25 mg and amlodipine 5 mg daily- not taking any potassium supplementation. Eats a lot of bananas. Lab Results  Component Value Date   NA 141 06/11/2015   K 3.7 06/11/2015   CL 102 06/11/2015   CO2 30 06/11/2015   Lab Results  Component Value Date   CREATININE 0.79 06/11/2015     HLD- on pravastatin 40 mg daily.  Denies myalgias. Lab Results  Component Value Date   CHOL 194 06/11/2015   HDL 52.50 06/11/2015   LDLCALC 121* 06/11/2015   LDLDIRECT 127.3 03/23/2013   TRIG 105.0 06/11/2015   CHOLHDL 4 06/11/2015     Lab Results  Component Value Date   CREATININE 0.79 06/11/2015   Lab Results  Component Value Date   ALT 34 06/11/2015   AST 22 06/11/2015   ALKPHOS 59 06/11/2015   BILITOT 0.5  06/11/2015   Lab Results  Component Value Date   NA 141 06/11/2015   K 3.7 06/11/2015   CL 102 06/11/2015   CO2 30 06/11/2015        Patient Active Problem List   Diagnosis Date Noted  . Medicare annual wellness visit, subsequent 05/17/2013  . Obesity, unspecified 05/17/2013  . HTN (hypertension) 03/23/2013  . HLD (hyperlipidemia) 03/23/2013  . OA (osteoarthritis) 03/23/2013   Past Medical History  Diagnosis Date  . Frequent headaches   . Hay fever   . Hypertension   . Hyperlipidemia    Past Surgical History  Procedure Laterality Date  . Back surgery  1983  . Hemorrhoid surgery    . Abdominal hysterectomy    . Tooth extraction     Social History  Substance Use Topics  . Smoking status: Never Smoker   . Smokeless tobacco: Never Used  . Alcohol Use: No   Family History  Problem Relation Age of Onset  . Heart disease Mother   . Alcohol abuse Father   . Arthritis Father   . Heart disease Father   . Stroke Father   . Hypertension Father   . Breast cancer Paternal Aunt 60   Allergies  Allergen Reactions  . Demerol [Meperidine] Other (See Comments)    Makes her "looney"   Current Outpatient Prescriptions on File Prior  to Visit  Medication Sig Dispense Refill  . amLODipine (NORVASC) 5 MG tablet TAKE 1 TABLET (5 MG TOTAL) BY MOUTH DAILY. OFFICE VISIT REQUIRED FOR ADDITIONAL REFILLS 30 tablet 0  . hydrochlorothiazide (HYDRODIURIL) 25 MG tablet TAKE 1 TABLET EVERY DAY 30 tablet 0  . pravastatin (PRAVACHOL) 40 MG tablet TAKE 1 TABLET EVERY EVENING (OFFICE VISIT WITH LABS REQUIRED FOR ADDITIONAL REFILLS) 30 tablet 0   No current facility-administered medications on file prior to visit.   The PMH, PSH, Social History, Family History, Medications, and allergies have been reviewed in [facility], and have been updated if relevant.    Review of Systems  Constitutional: Negative.   HENT: Negative.   Eyes: Negative.   Respiratory: Negative.   Cardiovascular: Negative.     Gastrointestinal: Negative.   Endocrine: Negative.   Genitourinary: Negative.   Musculoskeletal: Positive for myalgias. Negative for arthralgias.  Allergic/Immunologic: Negative.   Neurological: Negative.   Hematological: Negative.   Psychiatric/Behavioral: Negative.   All other systems reviewed and are negative.     Objective:    BP 112/66 mmHg  Pulse 65  Temp(Src) 97.7 F (36.5 C) (Oral)  Ht 5' 3.25" (1.607 m)  Wt 198 lb (89.812 kg)  BMI 34.78 kg/m2  SpO2 97%  Wt Readings from Last 3 Encounters:  06/18/15 198 lb (89.812 kg)  11/01/14 191 lb 4 oz (86.75 kg)  10/10/14 188 lb (85.276 kg)    Physical Exam  General:  Well-developed,well-nourished,in no acute distress; alert,appropriate and cooperative throughout examination Head:  normocephalic and atraumatic.   Eyes:  vision grossly intact, pupils equal, pupils round, and pupils reactive to light.   Ears:  R ear normal and L ear normal.   Lungs:  Normal respiratory effort, chest expands symmetrically. Lungs are clear to auscultation, no crackles or wheezes. Heart:  Normal rate and regular rhythm. S1 and S2 normal without gallop, murmur, click, rub or other extra sounds. Abdomen:  Bowel sounds positive,abdomen soft and non-tender without masses, organomegaly or hernias noted. Msk:  No deformity or scoliosis noted of thoracic or lumbar spine.   Extremities:  No clubbing, cyanosis, edema, or deformity noted with normal full range of motion of all joints.   Neurologic:  alert & oriented X3 and gait normal.   Skin:  Intact without suspicious lesions or rashes Psych:  Cognition and judgment appear intact. Alert and cooperative with normal attention span and concentration. No apparent delusions, illusions, hallucinations        Assessment & Plan:   Essential hypertension  HLD (hyperlipidemia)  Osteoarthritis, unspecified osteoarthritis type, unspecified site  Medicare annual wellness visit, subsequent  Obesity,  unspecified No Follow-up on file.

## 2015-07-04 ENCOUNTER — Other Ambulatory Visit: Payer: Self-pay | Admitting: Family Medicine

## 2015-07-10 ENCOUNTER — Ambulatory Visit: Payer: Commercial Managed Care - HMO | Attending: Family Medicine

## 2015-08-05 ENCOUNTER — Ambulatory Visit (INDEPENDENT_AMBULATORY_CARE_PROVIDER_SITE_OTHER): Payer: Commercial Managed Care - HMO | Admitting: Family Medicine

## 2015-08-05 ENCOUNTER — Encounter: Payer: Self-pay | Admitting: Family Medicine

## 2015-08-05 ENCOUNTER — Ambulatory Visit (INDEPENDENT_AMBULATORY_CARE_PROVIDER_SITE_OTHER)
Admission: RE | Admit: 2015-08-05 | Discharge: 2015-08-05 | Disposition: A | Discharge status: Home / Self Care | Payer: Commercial Managed Care - HMO | Source: Ambulatory Visit | Attending: Family Medicine | Admitting: Family Medicine

## 2015-08-05 VITALS — BP 130/80 | HR 72 | Temp 98.0°F | Ht 66.0 in | Wt 196.2 lb

## 2015-08-05 DIAGNOSIS — M25511 Pain in right shoulder: Secondary | ICD-10-CM

## 2015-08-05 DIAGNOSIS — M7581 Other shoulder lesions, right shoulder: Secondary | ICD-10-CM

## 2015-08-05 DIAGNOSIS — M7551 Bursitis of right shoulder: Secondary | ICD-10-CM

## 2015-08-05 MED ORDER — METHYLPREDNISOLONE ACETATE 40 MG/ML IJ SUSP
80.0000 mg | Freq: Once | INTRAMUSCULAR | Status: AC
  Administered 2015-08-05: 80 mg via INTRA_ARTICULAR

## 2015-08-05 NOTE — Progress Notes (Signed)
Dr. Frederico Hamman T. Paw Karstens, MD, [facility] [place], [address] Phone: [phone] Fax: [phone]  08/05/2015  Patient: Monica Lindsey, MRN: [id number], DOB: [birth year], 70 y.o.  Primary Physician:  Arnette Norris, MD   Chief Complaint  Patient presents with  . Shoulder Pain    Right   Subjective:   This 70 y.o. female patient noted above presents with shoulder pain that has been ongoing for 3 weeks there is no history of trauma or accident recently The patient denies neck pain or radicular symptoms. Denies dislocation, subluxation, separation of the shoulder. The patient does complain of pain in the overhead plane with significant painful arc of motion.  Right shoulder - years ago, had trouble aducting her shoulder.   The other weak, over did it. Took some nsaids.  Cleaning out the gararage, then got some pain. Str is preserved.  Medications Tried: Tylenol, NSAIDS Ice or Heat: minimally helpful Tried PT: No  Prior shoulder Injury: No Prior surgery: No Prior fracture: No  The PMH, PSH, Social History, Family History, Medications, and allergies have been reviewed in [facility], and have been updated if relevant.  Patient Active Problem List   Diagnosis Date Noted  . Medicare annual wellness visit, subsequent 05/17/2013  . Obesity, unspecified 05/17/2013  . HTN (hypertension) 03/23/2013  . HLD (hyperlipidemia) 03/23/2013  . OA (osteoarthritis) 03/23/2013    Past Medical History  Diagnosis Date  . Frequent headaches   . Hay fever   . Hypertension   . Hyperlipidemia     Past Surgical History  Procedure Laterality Date  . Back surgery  1983  . Hemorrhoid surgery    . Abdominal hysterectomy    . Tooth extraction      Social History   Social History  . Marital Status: Widowed    Spouse Name: N/A  . Number of Children: N/A  . Years of Education: N/A   Occupational History  . Not on file.   Social  History Main Topics  . Smoking status: Never Smoker   . Smokeless tobacco: Never Used  . Alcohol Use: No  . Drug Use: No  . Sexual Activity: No   Other Topics Concern  . 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   Widowed.      Still working 3 days per week.  Very close to her children and grand children.      Daughter, [name], is HPOA.  Has a living will.   Would desire CPR, would not desire prolonged life support if futile.             Family History  Problem Relation Age of Onset  . Heart disease Mother   . Alcohol abuse Father   . Arthritis Father   . Heart disease Father   . Stroke Father   . Hypertension Father   . Breast cancer Paternal Aunt 60    Allergies  Allergen Reactions  . Demerol [Meperidine] Other (See Comments)    Makes her "looney"    Medication list reviewed and updated in full in [place].  GEN: No fevers, chills. Nontoxic. Primarily MSK c/o today. MSK: Detailed in the HPI GI: tolerating PO intake without difficulty Neuro: No numbness, parasthesias, or tingling associated. Otherwise the pertinent positives of the ROS are noted above.   Objective:   Blood pressure 130/80, pulse 72, temperature  98 F (36.7 C), temperature source Oral, height 5\' 6"  (1.676 m), weight 196 lb 4 oz (89.018 kg).  GEN: Well-developed,well-nourished,in no acute distress; alert,appropriate and cooperative throughout examination HEENT: Normocephalic and atraumatic without obvious abnormalities. Ears, externally no deformities PULM: Breathing comfortably in no respiratory distress EXT: No clubbing, cyanosis, or edema PSYCH: Normally interactive. Cooperative during the interview. Pleasant. Friendly and conversant. Not anxious or depressed appearing. Normal, full affect.  Shoulder: R Inspection: No muscle wasting or winging Ecchymosis/edema: neg  AC joint, scapula, clavicle: NT Cervical spine: NT, full  ROM Spurling's: neg Abduction: full, 5/5 Flexion: full, 5/5 IR, full, lift-off: 5/5 ER at neutral: full, 5/5 AC crossover: neg Neer: neg Hawkins: mild Drop Test: neg Empty Can: pos Supraspinatus insertion: mild-mod T Bicipital groove: NT Speed's: neg Yergason's: neg Sulcus sign: neg Scapular dyskinesis: none C5-T1 intact  Neuro: Sensation intact Grip 5/5   Radiology: Dg Shoulder Right  08/05/2015  CLINICAL DATA:  Right shoulder pain, no known injury, initial encounter EXAM: RIGHT SHOULDER - 2+ VIEW COMPARISON:  None. FINDINGS: Mild degenerative changes of the acromioclavicular joint are seen. No acute fracture or dislocation is noted. The underlying bony thorax is within normal limits. IMPRESSION: No acute abnormality noted. Electronically Signed   By: Inez Catalina M.D.   On: 08/05/2015 14:52    Assessment and Plan:    Subacromial bursitis, right  Right shoulder pain - Plan: DG Shoulder Right, methylPREDNISolone acetate (DEPO-MEDROL) injection 80 mg  Rotator cuff tendonitis, right  Rotator cuff strengthening and scapular stabilization exercises were reviewed with the patient.   I think she primarily has subacromial bursitis.  Hopefully if we do an injection, she continues her NSAIDs, and does relative rest this will calm down.  SubAC Injection, R Verbal consent was obtained from the patient. Risks (including rare infection), benefits, and alternatives were explained. Patient prepped with Chloraprep and Ethyl Chloride used for anesthesia. The subacromial space was injected using the posterior approach. The patient tolerated the procedure well and had decreased pain post injection. No complications. Injection: 8 cc of Lidocaine 1% and 2 mL of Depo-Medrol 40 mg. Needle: 22 gauge    Follow-up: prn  Orders Placed This Encounter  Procedures  . DG Shoulder Right    Signed,  Frederico Hamman T. Chitara Clonch, MD   Patient's Medications  New Prescriptions   No medications on file    Previous Medications   AMLODIPINE (NORVASC) 5 MG TABLET    Take 1 tablet (5 mg total) by mouth daily.   HYDROCHLOROTHIAZIDE (HYDRODIURIL) 25 MG TABLET    TAKE 1 TABLET EVERY DAY   PRAVASTATIN (PRAVACHOL) 40 MG TABLET    Take 1 tablet (40 mg total) by mouth daily.  Modified Medications   No medications on file  Discontinued Medications   No medications on file

## 2015-08-05 NOTE — Progress Notes (Signed)
Pre visit review using our clinic review tool, if applicable. No additional management support is needed unless otherwise documented below in the visit note. 

## 2015-08-05 NOTE — Patient Instructions (Signed)
Thumb spica splint

## 2015-08-13 ENCOUNTER — Ambulatory Visit
Admission: RE | Admit: 2015-08-13 | Discharge: 2015-08-13 | Disposition: A | Discharge status: Home / Self Care | Payer: Commercial Managed Care - HMO | Source: Ambulatory Visit | Attending: Family Medicine | Admitting: Family Medicine

## 2015-08-13 ENCOUNTER — Other Ambulatory Visit: Payer: Self-pay | Admitting: Family Medicine

## 2015-08-13 DIAGNOSIS — Z1231 Encounter for screening mammogram for malignant neoplasm of breast: Secondary | ICD-10-CM

## 2015-09-05 ENCOUNTER — Ambulatory Visit: Payer: Commercial Managed Care - HMO | Admitting: Family Medicine

## 2015-09-05 ENCOUNTER — Ambulatory Visit (INDEPENDENT_AMBULATORY_CARE_PROVIDER_SITE_OTHER): Payer: Commercial Managed Care - HMO | Admitting: Family Medicine

## 2015-09-05 ENCOUNTER — Encounter: Payer: Self-pay | Admitting: Family Medicine

## 2015-09-05 DIAGNOSIS — R102 Pelvic and perineal pain: Secondary | ICD-10-CM | POA: Insufficient documentation

## 2015-09-05 DIAGNOSIS — R1030 Lower abdominal pain, unspecified: Secondary | ICD-10-CM | POA: Diagnosis not present

## 2015-09-05 LAB — POC URINALSYSI DIPSTICK (AUTOMATED)
Bilirubin, UA: NEGATIVE
GLUCOSE UA: NEGATIVE
Ketones, UA: NEGATIVE
LEUKOCYTES UA: NEGATIVE
NITRITE UA: NEGATIVE
Protein, UA: NEGATIVE
RBC UA: NEGATIVE
Spec Grav, UA: 1.02
UROBILINOGEN UA: 0.2
pH, UA: 6

## 2015-09-05 MED ORDER — AMOXICILLIN-POT CLAVULANATE 875-125 MG PO TABS: 1.0000 | ORAL_TABLET | Freq: Two times a day (BID) | ORAL | 0 refills | Status: DC

## 2015-09-05 NOTE — Progress Notes (Signed)
Subjective:   Patient ID: Monica Lindsey, female    DOB: [birth year], 70 y.o.   MRN: [id number]  Monica Lindsey is a pleasant 70 y.o. year old female who presents to clinic today with Hemorrhoids  on 09/05/2015  HPI:  Here for numerous complaints.  Initially, her concern was hemorrhoids.  Has a long h/o external hemorrhoids.  Remote history of hemorrhoidectomy in the 1980s.  A week ago, painful rectal bleeding but this has stopped.  Now having suprapubic and back pain.  No dysuria. Pain seems to be a little relieved when she has a BM.  BMs have been loose, she is gasy.  No nausea or vomiting.  No fevers.  Colonoscopy 07/10/09- reviewed in Epic- normal.    Current Outpatient Prescriptions on File Prior to Visit  Medication Sig Dispense Refill  . amLODipine (NORVASC) 5 MG tablet Take 1 tablet (5 mg total) by mouth daily. 90 tablet 2  . hydrochlorothiazide (HYDRODIURIL) 25 MG tablet TAKE 1 TABLET EVERY DAY 90 tablet 2  . pravastatin (PRAVACHOL) 40 MG tablet Take 1 tablet (40 mg total) by mouth daily. 90 tablet 2   No current facility-administered medications on file prior to visit.     Allergies  Allergen Reactions  . Demerol [Meperidine] Other (See Comments)    Makes her "looney"    Past Medical History:  Diagnosis Date  . Frequent headaches   . Hay fever   . Hyperlipidemia   . Hypertension     Past Surgical History:  Procedure Laterality Date  . ABDOMINAL HYSTERECTOMY    . BACK SURGERY  1983  . HEMORRHOID SURGERY    . TOOTH EXTRACTION      Family History  Problem Relation Age of Onset  . Heart disease Mother   . Alcohol abuse Father   . Arthritis Father   . Heart disease Father   . Stroke Father   . Hypertension Father   . Breast cancer Paternal Aunt 16    Social History   Social History  . Marital status: Widowed    Spouse name: N/A  . Number of children: N/A  . Years of education: N/A   Occupational History  . Not on file.   Social History  Main Topics  . Smoking status: Never Smoker  . Smokeless tobacco: Never Used  . Alcohol use No  . Drug use: No  . Sexual activity: No   Other Topics Concern  . 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   Widowed.      Still working 3 days per week.  Very close to her children and grand children.      Daughter, [name], is HPOA.  Has a living will.   Would desire CPR, would not desire prolonged life support if futile.            The PMH, PSH, Social History, Family History, Medications, and allergies have been reviewed in [facility], and have been updated if relevant.  Review of Systems  Constitutional: Negative.   HENT: Negative.   Respiratory: Negative.   Cardiovascular: Negative.   Gastrointestinal: Positive for abdominal pain, anal bleeding and diarrhea. Negative for abdominal distention, blood in stool, constipation, nausea, rectal pain and vomiting.  Genitourinary: Negative for difficulty urinating, dyspareunia, dysuria and pelvic pain.  Musculoskeletal: Positive for back pain.  Allergic/Immunologic: Negative.   Neurological: Negative.   Hematological: Negative.   Psychiatric/Behavioral: Negative.  All other systems reviewed and are negative.      Objective:    BP 126/82   Pulse 76   Temp 97.8 F (36.6 C) (Oral)   Wt 193 lb 8 oz (87.8 kg)   SpO2 96%   BMI 31.23 kg/m    Physical Exam  Constitutional: She is oriented to person, place, and time. She appears well-developed and well-nourished. No distress.  HENT:  Head: Normocephalic.  Eyes: Conjunctivae are normal.  Cardiovascular: Normal rate.   Pulmonary/Chest: Effort normal.  Abdominal: Soft. Bowel sounds are normal. She exhibits no distension. There is no tenderness. There is no rebound and no guarding.  Genitourinary: Rectum normal. Rectal exam shows no external hemorrhoid, no fissure and no mass.  Musculoskeletal: Normal range of motion.    Neurological: She is alert and oriented to person, place, and time. No cranial nerve deficit.  Skin: Skin is warm and dry. She is not diaphoretic.  Psychiatric: She has a normal mood and affect. Her behavior is normal. Judgment and thought content normal.  Nursing note and vitals reviewed.         Assessment & Plan:   Suprapubic pain, unspecified laterality No Follow-up on file.

## 2015-09-05 NOTE — Assessment & Plan Note (Signed)
With associated loose stools, resolving blood in stool, with benign exam. No diverticulosis on 2011 colonoscopy but I question if she has now developed diverticulitis. UA neg. Treat with Augmentin twice daily x 10 days for presumptive diverticulitis, add probiotic. Call or return to clinic prn if these symptoms worsen or fail to improve as anticipated and will proceed with imaging. The patient indicates understanding of these issues and agrees with the plan.

## 2015-09-05 NOTE — Addendum Note (Signed)
Addended by: Modena Nunnery on: 09/05/2015 08:45 AM   Modules accepted: Orders

## 2015-09-05 NOTE — Patient Instructions (Signed)
Good to see you. We are starting Augmentin - 1 tablet twice daily for 10 days. Please add an over the counter probiotic- ask your pharmacist about this.

## 2015-09-05 NOTE — Progress Notes (Signed)
Pre visit review using our clinic review tool, if applicable. No additional management support is needed unless otherwise documented below in the visit note. 

## 2015-09-16 ENCOUNTER — Encounter: Payer: Self-pay | Admitting: Family Medicine

## 2015-09-16 ENCOUNTER — Ambulatory Visit (INDEPENDENT_AMBULATORY_CARE_PROVIDER_SITE_OTHER): Payer: Commercial Managed Care - HMO | Admitting: Family Medicine

## 2015-09-16 DIAGNOSIS — R109 Unspecified abdominal pain: Secondary | ICD-10-CM | POA: Insufficient documentation

## 2015-09-16 DIAGNOSIS — R1030 Lower abdominal pain, unspecified: Secondary | ICD-10-CM

## 2015-09-16 NOTE — Patient Instructions (Signed)
Great to see you. Please stop by to see Monica Lindsey on your way out after you go to the lab.   

## 2015-09-16 NOTE — Progress Notes (Signed)
Subjective:   Patient ID: Monica Lindsey, female    DOB: [birth year], 70 y.o.   MRN: [id number]  Monica Lindsey is a pleasant 70 y.o. year old female who presents to clinic today with Follow-up  on 09/16/2015  HPI:  Saw her on 09/05/2015 for abdominal pain and loose stools.  Note reviewed.  Normal colonoscopy in 2011.  UA neg.  Placed on Augmentin twice daily x 10 days and probiotic for presumed diverticulitis.  She is here today because she is not feeling better.  Now having constipation.   No nausea or vomiting.  No fever.  Current Outpatient Prescriptions on File Prior to Visit  Medication Sig Dispense Refill  . amLODipine (NORVASC) 5 MG tablet Take 1 tablet (5 mg total) by mouth daily. 90 tablet 2  . hydrochlorothiazide (HYDRODIURIL) 25 MG tablet TAKE 1 TABLET EVERY DAY 90 tablet 2  . pravastatin (PRAVACHOL) 40 MG tablet Take 1 tablet (40 mg total) by mouth daily. 90 tablet 2   No current facility-administered medications on file prior to visit.     Allergies  Allergen Reactions  . Demerol [Meperidine] Other (See Comments)    Makes her "looney"    Past Medical History:  Diagnosis Date  . Frequent headaches   . Hay fever   . Hyperlipidemia   . Hypertension     Past Surgical History:  Procedure Laterality Date  . ABDOMINAL HYSTERECTOMY    . BACK SURGERY  1983  . HEMORRHOID SURGERY    . TOOTH EXTRACTION      Family History  Problem Relation Age of Onset  . Heart disease Mother   . Alcohol abuse Father   . Arthritis Father   . Heart disease Father   . Stroke Father   . Hypertension Father   . Breast cancer Paternal Aunt 45    Social History   Social History  . Marital status: Widowed    Spouse name: N/A  . Number of children: N/A  . Years of education: N/A   Occupational History  . Not on file.   Social History Main Topics  . Smoking status: Never Smoker  . Smokeless tobacco: Never Used  . Alcohol use No  . Drug use: No  . Sexual  activity: No   Other Topics Concern  . 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      Still working 3 days per week.  Very close to her children and grand children.      Daughter, Monica Lindsey, is HPOA.  Has a living will.   Would desire CPR, would not desire prolonged life support if futile.            The PMH, PSH, Social History, Family History, Medications, and allergies have been reviewed in [facility], and have been updated if relevant.   Review of Systems  Constitutional: Negative.   Gastrointestinal: Positive for abdominal distention, abdominal pain and constipation. Negative for anal bleeding, blood in stool, diarrhea, nausea, rectal pain and vomiting.  Musculoskeletal: Positive for back pain.  All other systems reviewed and are negative.      Objective:    BP 116/70   Pulse 68   Temp 97.7 F (36.5 C) (Oral)   Wt 195 lb (88.5 kg)   SpO2 96%   BMI 31.47 kg/m    Physical Exam  Constitutional: She is oriented to person, place, and time.  She appears well-developed and well-nourished. No distress.  HENT:  Head: Normocephalic.  Eyes: Conjunctivae are normal.  Cardiovascular: Normal rate and regular rhythm.   Pulmonary/Chest: Effort normal.  Abdominal: Soft. Bowel sounds are normal. She exhibits no distension and no mass. There is no tenderness. There is no rebound and no guarding.  Musculoskeletal: Normal range of motion.  Neurological: She is alert and oriented to person, place, and time. No cranial nerve deficit.  Skin: Skin is dry. She is not diaphoretic.  Psychiatric: She has a normal mood and affect. Her behavior is normal. Judgment and thought content normal.  Nursing note and vitals reviewed.         Assessment & Plan:   Lower abdominal pain - Plan: H. pylori antibody, IgG, Comprehensive metabolic panel, CBC, CT Abdomen Pelvis Wo Contrast No Follow-up on file.

## 2015-09-16 NOTE — Progress Notes (Signed)
Pre visit review using our clinic review tool, if applicable. No additional management support is needed unless otherwise documented below in the visit note. 

## 2015-09-16 NOTE — Assessment & Plan Note (Signed)
Persistent symptoms. Labs and CT of abdomen and pelvis for further work up. The patient indicates understanding of these issues and agrees with the plan. Orders Placed This Encounter  Procedures  . CT Abdomen Pelvis Wo Contrast  . H. pylori antibody, IgG  . Comprehensive metabolic panel  . CBC

## 2015-09-17 ENCOUNTER — Telehealth: Payer: Self-pay

## 2015-09-17 DIAGNOSIS — R1084 Generalized abdominal pain: Secondary | ICD-10-CM

## 2015-09-17 NOTE — Telephone Encounter (Signed)
Monica Lindsey with [facility] CT dept left v/m requesting different order for pt changed to CT of abdomen and pelvis with contrast since pt is having lower abd pain.Please advise.

## 2015-09-17 NOTE — Telephone Encounter (Signed)
Order changed.

## 2015-09-18 NOTE — Addendum Note (Signed)
Addended by: Ellamae Sia on: 09/18/2015 05:47 PM   Modules accepted: Orders

## 2015-09-19 ENCOUNTER — Ambulatory Visit
Admission: RE | Admit: 2015-09-19 | Discharge: 2015-09-19 | Disposition: A | Discharge status: Home / Self Care | Payer: Commercial Managed Care - HMO | Source: Ambulatory Visit | Attending: Family Medicine | Admitting: Family Medicine

## 2015-09-19 ENCOUNTER — Ambulatory Visit: Admission: RE | Admit: 2015-09-19 | Payer: Commercial Managed Care - HMO | Source: Ambulatory Visit

## 2015-09-19 DIAGNOSIS — I700 Atherosclerosis of aorta: Secondary | ICD-10-CM | POA: Diagnosis not present

## 2015-09-19 DIAGNOSIS — R1084 Generalized abdominal pain: Secondary | ICD-10-CM | POA: Insufficient documentation

## 2015-09-19 LAB — POCT I-STAT CREATININE: CREATININE: 0.9 mg/dL (ref 0.44–1.00)

## 2015-09-19 MED ORDER — IOPAMIDOL (ISOVUE-300) INJECTION 61%
100.0000 mL | Freq: Once | INTRAVENOUS | Status: AC | PRN
  Administered 2015-09-19: 100 mL via INTRAVENOUS

## 2015-09-25 ENCOUNTER — Telehealth: Payer: Self-pay

## 2015-09-25 DIAGNOSIS — M5440 Lumbago with sciatica, unspecified side: Secondary | ICD-10-CM

## 2015-09-25 NOTE — Telephone Encounter (Signed)
Referral placed. I am very sorry she is such bad pain.

## 2015-09-25 NOTE — Telephone Encounter (Signed)
Pt having back pain with sciatic nerve; pain level now is 9.5. Pt has tens unit at home and is going to use that now for back pain. From 1997 - 2000 pt saw Dr Primus Bravo for injections in back. Pt thinks she has reached the point she needs referral to Dr Primus Bravo for pain mgt. Pt last seen 09/16/15. Pt request cb.

## 2015-09-27 ENCOUNTER — Other Ambulatory Visit (INDEPENDENT_AMBULATORY_CARE_PROVIDER_SITE_OTHER): Payer: Commercial Managed Care - HMO

## 2015-09-27 DIAGNOSIS — R1030 Lower abdominal pain, unspecified: Secondary | ICD-10-CM

## 2015-09-27 LAB — COMPREHENSIVE METABOLIC PANEL
ALBUMIN: 4.1 g/dL (ref 3.5–5.2)
ALK PHOS: 56 U/L (ref 39–117)
ALT: 17 U/L (ref 0–35)
AST: 17 U/L (ref 0–37)
BUN: 21 mg/dL (ref 6–23)
CALCIUM: 9.4 mg/dL (ref 8.4–10.5)
CHLORIDE: 103 meq/L (ref 96–112)
CO2: 29 mEq/L (ref 19–32)
CREATININE: 1 mg/dL (ref 0.40–1.20)
GFR: 57.95 mL/min — ABNORMAL LOW (ref >60.00)
Glucose, Bld: 93 mg/dL (ref 70–99)
POTASSIUM: 3.5 meq/L (ref 3.5–5.1)
Sodium: 139 mEq/L (ref 135–145)
TOTAL PROTEIN: 6.8 g/dL (ref 6.0–8.3)
Total Bilirubin: 0.4 mg/dL (ref 0.2–1.2)

## 2015-09-27 LAB — H. PYLORI ANTIBODY, IGG: H PYLORI IGG: NEGATIVE

## 2015-09-27 LAB — CBC
HEMATOCRIT: 43.5 % (ref 36.0–46.0)
HEMOGLOBIN: 14.8 g/dL (ref 12.0–15.0)
MCHC: 34.1 g/dL (ref 30.0–36.0)
MCV: 88.9 fl (ref 78.0–100.0)
Platelets: 206 10*3/uL (ref 150.0–400.0)
RBC: 4.89 Mil/uL (ref 3.87–5.11)
RDW: 13.2 % (ref 11.5–15.5)
WBC: 5 10*3/uL (ref 4.0–10.5)

## 2015-10-03 ENCOUNTER — Encounter: Payer: Self-pay | Admitting: *Deleted

## 2015-10-31 ENCOUNTER — Ambulatory Visit (INDEPENDENT_AMBULATORY_CARE_PROVIDER_SITE_OTHER): Payer: Commercial Managed Care - HMO | Admitting: Family Medicine

## 2015-10-31 ENCOUNTER — Encounter: Payer: Self-pay | Admitting: Family Medicine

## 2015-10-31 VITALS — BP 130/80 | HR 77 | Temp 98.2°F | Ht 63.25 in | Wt 190.0 lb

## 2015-10-31 DIAGNOSIS — M7551 Bursitis of right shoulder: Secondary | ICD-10-CM

## 2015-10-31 DIAGNOSIS — M7581 Other shoulder lesions, right shoulder: Secondary | ICD-10-CM

## 2015-10-31 NOTE — Progress Notes (Signed)
Pre visit review using our clinic review tool, if applicable. No additional management support is needed unless otherwise documented below in the visit note. 

## 2015-10-31 NOTE — Progress Notes (Signed)
Dr. Frederico Hamman T. Temprance Wyre, MD, [facility] [place], [address] Phone: [phone] Fax: [phone]  10/31/2015  Patient: Monica Lindsey, MRN: [id number], DOB: 09-May-1945, 70 y.o.  Primary Physician:  Arnette Norris, MD   Chief Complaint  Patient presents with  . Shoulder Pain   Subjective:   R shoulder is doing worse. Driving with the left arm - RHD and now not using the R much all that much.   No injury to her shoulder at all.  3-4 years ago went to a seminar about shoulder health.   He injected her shoulder with a subacromial injection approximately 2 and half months ago.  She now is been having shoulder pain for 3-3-1/2 months.  She is altered the function of her right arm is now using her left arm for most activities.   She continues to have pain with abduction and she is also having some pain at night.  She has not done any rehabilitation for physical therapy.  08/05/2015 Last OV with Owens Loffler, MD  This 70 y.o. female patient noted above presents with shoulder pain that has been ongoing for 3 weeks there is no history of trauma or accident recently The patient denies neck pain or radicular symptoms. Denies dislocation, subluxation, separation of the shoulder. The patient does complain of pain in the overhead plane with significant painful arc of motion.  Right shoulder - years ago, had trouble aducting her shoulder.   The other weak, over did it. Took some nsaids.  Cleaning out the gararage, then got some pain. Str is preserved.  Medications Tried: Tylenol, NSAIDS Ice or Heat: minimally helpful Tried PT: No  Prior shoulder Injury: No Prior surgery: No Prior fracture: No  The PMH, PSH, Social History, Family History, Medications, and allergies have been reviewed in [facility], and have been updated if relevant.  Patient Active Problem List   Diagnosis Date Noted  . Abdominal pain 09/16/2015  . Suprapubic pain  09/05/2015  . Medicare annual wellness visit, subsequent 05/17/2013  . Obesity, unspecified 05/17/2013  . HTN (hypertension) 03/23/2013  . HLD (hyperlipidemia) 03/23/2013  . OA (osteoarthritis) 03/23/2013    Past Medical History:  Diagnosis Date  . Frequent headaches   . Hay fever   . Hyperlipidemia   . Hypertension     Past Surgical History:  Procedure Laterality Date  . ABDOMINAL HYSTERECTOMY    . BACK SURGERY  1983  . HEMORRHOID SURGERY    . TOOTH EXTRACTION      Social History   Social History  . Marital status: Widowed    Spouse name: N/A  . Number of children: N/A  . Years of education: N/A   Occupational History  . Not on file.   Social History Main Topics  . Smoking status: Never Smoker  . Smokeless tobacco: Never Used  . Alcohol use No  . Drug use: No  . Sexual activity: No   Other Topics Concern  . 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   Widowed.      Still working 3 days per week.  Very close to her children and grand children.      Daughter, [name], is HPOA.  Has a living will.   Would desire CPR, would not desire prolonged life support if futile.             Family  History  Problem Relation Age of Onset  . Heart disease Mother   . Alcohol abuse Father   . Arthritis Father   . Heart disease Father   . Stroke Father   . Hypertension Father   . Breast cancer Paternal Aunt 60    Allergies  Allergen Reactions  . Demerol [Meperidine] Other (See Comments)    Makes her "looney"    Medication list reviewed and updated in full in [place].  GEN: No fevers, chills. Nontoxic. Primarily MSK c/o today. MSK: Detailed in the HPI GI: tolerating PO intake without difficulty Neuro: No numbness, parasthesias, or tingling associated. Otherwise the pertinent positives of the ROS are noted above.   Objective:   Blood pressure 130/80, pulse 77, temperature 98.2 F (36.8 C),  temperature source Oral, height 5' 3.25" (1.607 m), weight 190 lb (86.2 kg).  GEN: Well-developed,well-nourished,in no acute distress; alert,appropriate and cooperative throughout examination HEENT: Normocephalic and atraumatic without obvious abnormalities. Ears, externally no deformities PULM: Breathing comfortably in no respiratory distress EXT: No clubbing, cyanosis, or edema PSYCH: Normally interactive. Cooperative during the interview. Pleasant. Friendly and conversant. Not anxious or depressed appearing. Normal, full affect.  Shoulder: R Inspection: No muscle wasting or winging Ecchymosis/edema: neg  AC joint, scapula, clavicle: NT Cervical spine: NT, full ROM Spurling's: neg Abduction: full, 5/5 Flexion: full, 5/5 IR, full, lift-off: 5/5 ER at neutral: full, 5/5 AC crossover: neg Neer: neg Hawkins: mild Drop Test: neg Empty Can: pos Supraspinatus insertion: mild-mod T Bicipital groove: NT Speed's: neg Yergason's: neg Sulcus sign: neg Scapular dyskinesis: none C5-T1 intact  Neuro: Sensation intact Grip 5/5   Radiology: CLINICAL DATA:  Right shoulder pain, no known injury, initial encounter  EXAM: RIGHT SHOULDER - 2+ VIEW  COMPARISON:  None.  FINDINGS: Mild degenerative changes of the acromioclavicular joint are seen. No acute fracture or dislocation is noted. The underlying bony thorax is within normal limits.  IMPRESSION: No acute abnormality noted.   Electronically Signed   By: Inez Catalina M.D.   On: 08/05/2015 14:52  Assessment and Plan:    Rotator cuff tendonitis, right - Plan: Ambulatory referral to Physical Therapy  Subacromial bursitis, right - Plan: Ambulatory referral to Physical Therapy  Right-sided shoulder pain, impingement.  Certainly not improved and appears to be somewhat worse.  Suspect mechanical impingement, consult physical therapy and continue with home exercise program with good follow-up in 2 months.  Follow-up:  Return in about 8 weeks (around 12/26/2015).  Orders Placed This Encounter  Procedures  . Ambulatory referral to Physical Therapy    Signed,  Frederico Hamman T. Kelleen Stolze, MD   Patient's Medications  New Prescriptions   No medications on file  Previous Medications   AMLODIPINE (NORVASC) 5 MG TABLET    Take 1 tablet (5 mg total) by mouth daily.   HYDROCHLOROTHIAZIDE (HYDRODIURIL) 25 MG TABLET    TAKE 1 TABLET EVERY DAY   PRAVASTATIN (PRAVACHOL) 40 MG TABLET    Take 1 tablet (40 mg total) by mouth daily.  Modified Medications   No medications on file  Discontinued Medications   No medications on file

## 2015-11-04 ENCOUNTER — Telehealth: Payer: Self-pay | Admitting: Family Medicine

## 2015-11-04 ENCOUNTER — Other Ambulatory Visit: Payer: Self-pay | Admitting: Family Medicine

## 2015-11-04 DIAGNOSIS — M7581 Other shoulder lesions, right shoulder: Secondary | ICD-10-CM

## 2015-11-04 NOTE — Telephone Encounter (Signed)
Pt called. [facility] cannot see her until 11/2015 for shoulder rehab and she is going out of the state for 2 weeks at the beginning of November.  Pt is requesting to go somewhere else. Please put in another referral for shoulder rehab with recommendations on where you would like her to go.  Thanks

## 2015-11-04 NOTE — Telephone Encounter (Signed)
This was done.

## 2015-11-11 DIAGNOSIS — M7541 Impingement syndrome of right shoulder: Secondary | ICD-10-CM | POA: Diagnosis not present

## 2015-11-11 DIAGNOSIS — M25511 Pain in right shoulder: Secondary | ICD-10-CM | POA: Diagnosis not present

## 2015-11-11 DIAGNOSIS — M6281 Muscle weakness (generalized): Secondary | ICD-10-CM | POA: Diagnosis not present

## 2015-11-14 DIAGNOSIS — M6281 Muscle weakness (generalized): Secondary | ICD-10-CM | POA: Diagnosis not present

## 2015-11-14 DIAGNOSIS — M25511 Pain in right shoulder: Secondary | ICD-10-CM | POA: Diagnosis not present

## 2015-11-14 DIAGNOSIS — M7541 Impingement syndrome of right shoulder: Secondary | ICD-10-CM | POA: Diagnosis not present

## 2015-11-18 ENCOUNTER — Telehealth: Payer: Self-pay

## 2015-11-18 DIAGNOSIS — M25511 Pain in right shoulder: Secondary | ICD-10-CM | POA: Diagnosis not present

## 2015-11-18 DIAGNOSIS — M7541 Impingement syndrome of right shoulder: Secondary | ICD-10-CM | POA: Diagnosis not present

## 2015-11-18 DIAGNOSIS — M6281 Muscle weakness (generalized): Secondary | ICD-10-CM | POA: Diagnosis not present

## 2015-11-18 NOTE — Telephone Encounter (Signed)
Pt request refills amlodipine,HCTZ and pravastatin to [facility].I spoke with Apolonio Schneiders at [facility] and rx will be sent to pt in 5-7 days. Pt voiced understanding.

## 2015-11-20 DIAGNOSIS — M25511 Pain in right shoulder: Secondary | ICD-10-CM | POA: Diagnosis not present

## 2015-11-20 DIAGNOSIS — M6281 Muscle weakness (generalized): Secondary | ICD-10-CM | POA: Diagnosis not present

## 2015-11-20 DIAGNOSIS — M7541 Impingement syndrome of right shoulder: Secondary | ICD-10-CM | POA: Diagnosis not present

## 2015-11-25 DIAGNOSIS — M25511 Pain in right shoulder: Secondary | ICD-10-CM | POA: Diagnosis not present

## 2015-11-25 DIAGNOSIS — M6281 Muscle weakness (generalized): Secondary | ICD-10-CM | POA: Diagnosis not present

## 2015-11-25 DIAGNOSIS — M7541 Impingement syndrome of right shoulder: Secondary | ICD-10-CM | POA: Diagnosis not present

## 2015-11-27 ENCOUNTER — Encounter: Payer: Self-pay | Admitting: Family Medicine

## 2015-11-27 ENCOUNTER — Ambulatory Visit (INDEPENDENT_AMBULATORY_CARE_PROVIDER_SITE_OTHER): Payer: Commercial Managed Care - HMO | Admitting: Family Medicine

## 2015-11-27 VITALS — BP 130/86 | HR 72 | Temp 98.2°F | Ht 63.25 in | Wt 190.5 lb

## 2015-11-27 DIAGNOSIS — Z23 Encounter for immunization: Secondary | ICD-10-CM | POA: Diagnosis not present

## 2015-11-27 DIAGNOSIS — M25511 Pain in right shoulder: Secondary | ICD-10-CM

## 2015-11-27 DIAGNOSIS — M7581 Other shoulder lesions, right shoulder: Secondary | ICD-10-CM

## 2015-11-27 DIAGNOSIS — M6281 Muscle weakness (generalized): Secondary | ICD-10-CM | POA: Diagnosis not present

## 2015-11-27 DIAGNOSIS — M7541 Impingement syndrome of right shoulder: Secondary | ICD-10-CM | POA: Diagnosis not present

## 2015-11-27 MED ORDER — METHYLPREDNISOLONE ACETATE 40 MG/ML IJ SUSP
80.0000 mg | Freq: Once | INTRAMUSCULAR | Status: AC
  Administered 2015-11-27: 80 mg via INTRA_ARTICULAR

## 2015-11-27 NOTE — Progress Notes (Signed)
Dr. Frederico Hamman T. Tonna Palazzi, MD, [facility] [place], [address] Phone: [phone] Fax: [phone]  11/27/2015  Patient: Monica Lindsey, MRN: [id number], DOB: [birth year], 70 y.o.  Primary Physician:  Arnette Norris, MD   Chief Complaint  Patient presents with  . Shoulder Pain    Right-Wants injected   Subjective:   R shoulder is doing worse. Driving with the left arm - RHD and now not using the R much all that much.   Going to [place] - driving from [place] to [place].  Brother is doing some of the driving.   Still with ROM and Str.  Shoulder has seemed to stiffen somewhat, and she also is still little bit weak in the right shoulder. She thinks she has gotten dramatically improved after she has been doing her physical therapy and working on this at home.  R combined shoulder inj  11/04/2015 Last OV with Owens Loffler, MD  No injury to her shoulder at all.  3-4 years ago went to a seminar about shoulder health.   He injected her shoulder with a subacromial injection approximately 2 and half months ago.  She now is been having shoulder pain for 3-3-1/2 months.  She is altered the function of her right arm is now using her left arm for most activities.   She continues to have pain with abduction and she is also having some pain at night.  She has not done any rehabilitation for physical therapy.  08/05/2015 Last OV with Owens Loffler, MD  This 70 y.o. female patient noted above presents with shoulder pain that has been ongoing for 3 weeks there is no history of trauma or accident recently The patient denies neck pain or radicular symptoms. Denies dislocation, subluxation, separation of the shoulder. The patient does complain of pain in the overhead plane with significant painful arc of motion.  Right shoulder - years ago, had trouble aducting her shoulder.   The other weak, over did it. Took some nsaids.    Cleaning out the gararage, then got some pain. Str is preserved.  Medications Tried: Tylenol, NSAIDS Ice or Heat: minimally helpful Tried PT: No  Prior shoulder Injury: No Prior surgery: No Prior fracture: No  The PMH, PSH, Social History, Family History, Medications, and allergies have been reviewed in [facility], and have been updated if relevant.  Patient Active Problem List   Diagnosis Date Noted  . Abdominal pain 09/16/2015  . Suprapubic pain 09/05/2015  . Medicare annual wellness visit, subsequent 05/17/2013  . Obesity, unspecified 05/17/2013  . HTN (hypertension) 03/23/2013  . HLD (hyperlipidemia) 03/23/2013  . OA (osteoarthritis) 03/23/2013    Past Medical History:  Diagnosis Date  . Frequent headaches   . Hay fever   . Hyperlipidemia   . Hypertension     Past Surgical History:  Procedure Laterality Date  . ABDOMINAL HYSTERECTOMY    . BACK SURGERY  1983  . HEMORRHOID SURGERY    . TOOTH EXTRACTION      Social History   Social History  . Marital status: Widowed    Spouse name: N/A  . Number of children: N/A  . Years of education: N/A   Occupational History  . Not on file.   Social History Main Topics  . Smoking status: Never Smoker  . Smokeless tobacco: Never Used  . Alcohol use No  . Drug use: No  . Sexual activity: No   Other  Topics Concern  . 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   Widowed.      Still working 3 days per week.  Very close to her children and grand children.      Daughter, [name], is HPOA.  Has a living will.   Would desire CPR, would not desire prolonged life support if futile.             Family History  Problem Relation Age of Onset  . Heart disease Mother   . Alcohol abuse Father   . Arthritis Father   . Heart disease Father   . Stroke Father   . Hypertension Father   . Breast cancer Paternal Aunt 60    Allergies  Allergen Reactions  . Demerol  [Meperidine] Other (See Comments)    Makes her "looney"    Medication list reviewed and updated in full in [place].  GEN: No fevers, chills. Nontoxic. Primarily MSK c/o today. MSK: Detailed in the HPI GI: tolerating PO intake without difficulty Neuro: No numbness, parasthesias, or tingling associated. Otherwise the pertinent positives of the ROS are noted above.   Objective:   Blood pressure 130/86, pulse 72, temperature 98.2 F (36.8 C), temperature source Oral, height 5' 3.25" (1.607 m), weight 190 lb 8 oz (86.4 kg).  GEN: Well-developed,well-nourished,in no acute distress; alert,appropriate and cooperative throughout examination HEENT: Normocephalic and atraumatic without obvious abnormalities. Ears, externally no deformities PULM: Breathing comfortably in no respiratory distress EXT: No clubbing, cyanosis, or edema PSYCH: Normally interactive. Cooperative during the interview. Pleasant. Friendly and conversant. Not anxious or depressed appearing. Normal, full affect.  Shoulder: R Inspection: No muscle wasting or winging Ecchymosis/edema: neg  AC joint, scapula, clavicle: NT Cervical spine: NT, full ROM Spurling's: neg On exam, she does appear to have more stiffness compared to her prior examination. This particular noted on flexion and abduction. Abduction: full, 4+/5 Flexion: full, 4+/5 IR, full, lift-off: 5/5 ER at neutral: full, 5/5 AC crossover: neg Neer: neg Hawkins: mild pos Drop Test: neg Empty Can: pos Supraspinatus insertion: mild-mod T Bicipital groove: NT Speed's: neg Yergason's: neg Sulcus sign: neg Scapular dyskinesis: none C5-T1 intact  Neuro: Sensation intact Grip 5/5   Radiology: CLINICAL DATA:  Right shoulder pain, no known injury, initial encounter  EXAM: RIGHT SHOULDER - 2+ VIEW  COMPARISON:  None.  FINDINGS: Mild degenerative changes of the acromioclavicular joint are seen. No acute fracture or dislocation is noted. The  underlying bony thorax is within normal limits.  IMPRESSION: No acute abnormality noted.   Electronically Signed   By: Inez Catalina M.D.   On: 08/05/2015 14:52  Assessment and Plan:    Right shoulder pain, unspecified chronicity - Plan: methylPREDNISolone acetate (DEPO-MEDROL) injection 80 mg  Need for prophylactic vaccination and inoculation against influenza - Plan: Flu Vaccine QUAD 36+ mos IM  Rotator cuff tendonitis, right  Rotator cuff tendinopathy, subacromial bursitis. Cannot fully exclude rotator cuff tear. She also has been increased stiffness, cannot exclude early for his shoulder, but after intra-articular injection, the patient had improved range of motion that approached full making this less likely.  Intrarticular Shoulder Injection, R Verbal consent was obtained from the patient. Risks including infection explained and contrasted with benefits and alternatives. Patient prepped with Chloraprep and Ethyl Chloride used for anesthesia. An intraarticular shoulder injection was performed using the posterior approach. The patient tolerated the procedure well and had decreased pain  post injection. No complications. Injection: 4 cc of Lidocaine 1% and 1 mL Depo-Medrol 40 mg. Needle: 22 gauge   SubAC Injection, R Verbal consent was obtained from the patient. Risks (including rare infection), benefits, and alternatives were explained. Patient prepped with Chloraprep and Ethyl Chloride used for anesthesia. The subacromial space was injected using the posterior approach. The patient tolerated the procedure well and had decreased pain post injection. No complications. Injection: 4 cc of Lidocaine 1% and 1 mL of Depo-Medrol 40 mg. Needle: 22 gauge     Follow-up: 6 weeks  Orders Placed This Encounter  Procedures  . Flu Vaccine QUAD 36+ mos IM    Signed,  Rommie Dunn T. Cayci Mcnabb, MD   Patient's Medications  New Prescriptions   No medications on file  Previous Medications    AMLODIPINE (NORVASC) 5 MG TABLET    Take 1 tablet (5 mg total) by mouth daily.   HYDROCHLOROTHIAZIDE (HYDRODIURIL) 25 MG TABLET    TAKE 1 TABLET EVERY DAY   PRAVASTATIN (PRAVACHOL) 40 MG TABLET    Take 1 tablet (40 mg total) by mouth daily.  Modified Medications   No medications on file  Discontinued Medications   No medications on file

## 2015-11-27 NOTE — Progress Notes (Signed)
Pre visit review using our clinic review tool, if applicable. No additional management support is needed unless otherwise documented below in the visit note. 

## 2016-01-08 ENCOUNTER — Ambulatory Visit: Payer: Commercial Managed Care - HMO | Admitting: Family Medicine

## 2016-01-15 ENCOUNTER — Ambulatory Visit: Payer: Commercial Managed Care - HMO | Admitting: Family Medicine

## 2016-07-02 ENCOUNTER — Ambulatory Visit: Payer: Commercial Managed Care - HMO

## 2016-07-07 ENCOUNTER — Encounter: Payer: Commercial Managed Care - HMO | Admitting: Family Medicine

## 2016-07-09 ENCOUNTER — Ambulatory Visit (INDEPENDENT_AMBULATORY_CARE_PROVIDER_SITE_OTHER): Payer: Medicare HMO

## 2016-07-09 VITALS — BP 122/80 | HR 70 | Temp 97.6°F | Ht 63.25 in | Wt 189.8 lb

## 2016-07-09 DIAGNOSIS — I10 Essential (primary) hypertension: Secondary | ICD-10-CM | POA: Diagnosis not present

## 2016-07-09 DIAGNOSIS — Z0000 Encounter for general adult medical examination without abnormal findings: Secondary | ICD-10-CM

## 2016-07-09 DIAGNOSIS — E784 Other hyperlipidemia: Secondary | ICD-10-CM

## 2016-07-09 DIAGNOSIS — E7849 Other hyperlipidemia: Secondary | ICD-10-CM

## 2016-07-09 DIAGNOSIS — Z1159 Encounter for screening for other viral diseases: Secondary | ICD-10-CM | POA: Diagnosis not present

## 2016-07-09 LAB — CBC WITH DIFFERENTIAL/PLATELET
BASOS PCT: 0.6 % (ref 0.0–3.0)
Basophils Absolute: 0 10*3/uL (ref 0.0–0.1)
EOS ABS: 0.1 10*3/uL (ref 0.0–0.7)
Eosinophils Relative: 1.9 % (ref 0.0–5.0)
HCT: 44 % (ref 36.0–46.0)
HEMOGLOBIN: 14.8 g/dL (ref 12.0–15.0)
Lymphocytes Relative: 31.8 % (ref 12.0–46.0)
Lymphs Abs: 2.1 10*3/uL (ref 0.7–4.0)
MCHC: 33.6 g/dL (ref 30.0–36.0)
MCV: 90.9 fl (ref 78.0–100.0)
MONO ABS: 0.5 10*3/uL (ref 0.1–1.0)
Monocytes Relative: 7.6 % (ref 3.0–12.0)
NEUTROS ABS: 3.8 10*3/uL (ref 1.4–7.7)
Neutrophils Relative %: 58.1 % (ref 43.0–77.0)
PLATELETS: 263 10*3/uL (ref 150.0–400.0)
RBC: 4.84 Mil/uL (ref 3.87–5.11)
RDW: 13.6 % (ref 11.5–15.5)
WBC: 6.5 10*3/uL (ref 4.0–10.5)

## 2016-07-09 LAB — COMPREHENSIVE METABOLIC PANEL
ALT: 24 U/L (ref 0–35)
AST: 23 U/L (ref 0–37)
Albumin: 4.7 g/dL (ref 3.5–5.2)
Alkaline Phosphatase: 56 U/L (ref 39–117)
BUN: 29 mg/dL — ABNORMAL HIGH (ref 6–23)
CHLORIDE: 102 meq/L (ref 96–112)
CO2: 30 meq/L (ref 19–32)
CREATININE: 1.03 mg/dL (ref 0.40–1.20)
Calcium: 10.4 mg/dL (ref 8.4–10.5)
GFR: 55.89 mL/min — ABNORMAL LOW (ref >60.00)
GLUCOSE: 95 mg/dL (ref 70–99)
Potassium: 4.2 mEq/L (ref 3.5–5.1)
SODIUM: 139 meq/L (ref 135–145)
Total Bilirubin: 0.4 mg/dL (ref 0.2–1.2)
Total Protein: 7.8 g/dL (ref 6.0–8.3)

## 2016-07-09 LAB — LIPID PANEL
CHOLESTEROL: 197 mg/dL (ref 0–200)
HDL: 56.5 mg/dL (ref >39.00)
LDL CALC: 114 mg/dL — AB (ref 0–99)
NonHDL: 140.09
Total CHOL/HDL Ratio: 3
Triglycerides: 131 mg/dL (ref 0.0–149.0)
VLDL: 26.2 mg/dL (ref 0.0–40.0)

## 2016-07-09 LAB — TSH: TSH: 0.63 u[IU]/mL (ref 0.35–4.50)

## 2016-07-09 NOTE — Progress Notes (Signed)
PCP notes:   Health maintenance:  Bone density - PCP please place referral at [facility]PE Tetanus - postponed/insurance Hep C screening - completed  Abnormal screenings:   Hearing - failed Fall risk - hx of multiple falls with injury but no medical treatment  Patient concerns:   None  Nurse concerns:  None  Next PCP appt:   07/16/16 @ 0830

## 2016-07-09 NOTE — Progress Notes (Signed)
Subjective:   Monica Lindsey is a 71 y.o. female who presents for Medicare Annual (Subsequent) preventive examination.  Review of Systems:  N/A Cardiac Risk Factors include: advanced age (>58men, >47 women);dyslipidemia;hypertension     Objective:     Vitals: BP 122/80 (BP Location: Right Arm, Patient Position: Sitting, Cuff Size: Normal)   Pulse 70   Temp 97.6 F (36.4 C) (Oral)   Ht 5' 3.25" (1.607 m) Comment: no shoes  Wt 189 lb 12 oz (86.1 kg)   SpO2 97%   BMI 33.35 kg/m   Body mass index is 33.35 kg/m.   Tobacco History  Smoking Status  . Never Smoker  Smokeless Tobacco  . Never Used     Counseling given: No   Past Medical History:  Diagnosis Date  . Frequent headaches   . Hay fever   . Hyperlipidemia   . Hypertension    Past Surgical History:  Procedure Laterality Date  . ABDOMINAL HYSTERECTOMY    . BACK SURGERY  1983  . HEMORRHOID SURGERY    . TOOTH EXTRACTION     Family History  Problem Relation Age of Onset  . Heart disease Mother   . Alcohol abuse Father   . Arthritis Father   . Heart disease Father   . Stroke Father   . Hypertension Father   . Breast cancer Paternal Aunt 72   History  Sexual Activity  . Sexual activity: No    Outpatient Encounter Prescriptions as of 07/09/2016  Medication Sig  . amLODipine (NORVASC) 5 MG tablet Take 1 tablet (5 mg total) by mouth daily.  . hydrochlorothiazide (HYDRODIURIL) 25 MG tablet TAKE 1 TABLET EVERY DAY  . pravastatin (PRAVACHOL) 40 MG tablet Take 1 tablet (40 mg total) by mouth daily.   No facility-administered encounter medications on file as of 07/09/2016.     Activities of Daily Living In your present state of health, do you have any difficulty performing the following activities: 07/09/2016  Hearing? N  Vision? N  Difficulty concentrating or making decisions? N  Walking or climbing stairs? N  Dressing or bathing? N  Doing errands, shopping? N  Preparing Food and eating ? N  Using the  Toilet? N  In the past six months, have you accidently leaked urine? N  Do you have problems with loss of bowel control? N  Managing your Medications? N  Managing your Finances? N  Housekeeping or managing your Housekeeping? N  Some recent data might be hidden    Patient Care Team: Lucille Passy, MD as PCP - General (Family Medicine) Oh, Lupita Dawn, MD (Inactive) as Physician Assistant (Internal Medicine) Barrie Dunker, MD as Counselor (Dermatology)    Assessment:     Hearing Screening   125Hz  250Hz  500Hz  1000Hz  2000Hz  3000Hz  4000Hz  6000Hz  8000Hz   Right ear:   40 40 40  40    Left ear:   40 0 40  40      Visual Acuity Screening   Right eye Left eye Both eyes  Without correction: 20/25 20/15-1 20/15-1  With correction:       Exercise Activities and Dietary recommendations Current Exercise Habits: The patient has a physically strenous job, but has no regular exercise apart from work., Exercise limited by: None identified  Goals    . Increase physical activity          Starting 07/09/2016, I will continue to do physical therapy exercises to improve ROM for right shoulder daily and  to continue to weight loss efforts.       Fall Risk Fall Risk  07/09/2016 06/18/2015 06/11/2014 05/17/2013  Falls in the past year? Yes Yes Yes Yes  Number falls in past yr: 2 or more 1 2 or more 2 or more  Injury with Fall? No - No -   Depression Screen PHQ 2/9 Scores 07/09/2016 06/18/2015 06/11/2014 05/17/2013  PHQ - 2 Score 0 0 1 0     Cognitive Function MMSE - Mini Mental State Exam 07/09/2016  Orientation to time 5  Orientation to Place 5  Registration 3  Attention/ Calculation 0  Recall 3  Language- name 2 objects 0  Language- repeat 1  Language- follow 3 step command 3  Language- read & follow direction 0  Write a sentence 0  Copy design 0  Total score 20       PLEASE NOTE: A Mini-Cog screen was completed. Maximum score is 20. A value of 0 denotes this part of [place] MMSE was not  completed or the patient failed this part of the Mini-Cog screening.   Mini-Cog Screening Orientation to Time - Max 5 pts Orientation to Place - Max 5 pts Registration - Max 3 pts Recall - Max 3 pts Language Repeat - Max 1 pts Language Follow 3 Step Command - Max 3 pts   Immunization History  Administered Date(s) Administered  . DTaP 10/27/2007  . Influenza,inj,Quad PF,36+ Mos 12/06/2013, 10/10/2014, 11/27/2015  . Pneumococcal Conjugate-13 05/17/2013  . Pneumococcal-Unspecified 03/06/2009  . Zoster 10/27/2007   Screening Tests Health Maintenance  Topic Date Due  . DEXA SCAN  07/09/2017 (Originally 11/22/2008)  . TETANUS/TDAP  07/10/2026 (Originally 11/23/1962)  . INFLUENZA VACCINE  09/02/2016  . MAMMOGRAM  08/12/2017  . COLONOSCOPY  07/11/2019  . Hepatitis C Screening  Completed  . PNA vac Low Risk Adult  Completed      Plan:     I have personally reviewed and addressed the Medicare Annual Wellness questionnaire and have noted the following in the patient's chart:  A. Medical and social history B. Use of alcohol, tobacco or illicit drugs  C. Current medications and supplements D. Functional ability and status E.  Nutritional status F.  Physical activity G. Advance directives H. List of other physicians I.  Hospitalizations, surgeries, and ER visits in previous 12 months J.  [place] to include hearing, vision, cognitive, depression L. Referrals and appointments - none  In addition, I have reviewed and discussed with patient certain preventive protocols, quality metrics, and best practice recommendations. A written personalized care plan for preventive services as well as general preventive health recommendations were provided to patient.  See attached scanned questionnaire for additional information.   Signed,   Lindell Noe, MHA, BS, LPN Health Coach

## 2016-07-09 NOTE — Progress Notes (Signed)
Pre visit review using our clinic review tool, if applicable. No additional management support is needed unless otherwise documented below in the visit note. 

## 2016-07-09 NOTE — Patient Instructions (Signed)
Ms. Zeisler , Thank you for taking time to come for your Medicare Wellness Visit. I appreciate your ongoing commitment to your health goals. Please review the following plan we discussed and let me know if I can assist you in the future.   These are the goals we discussed: Goals    . Increase physical activity          Starting 07/09/2016, I will continue to do physical therapy exercises to improve ROM for right shoulder daily and to continue to weight loss efforts.        This is a list of the screening recommended for you and due dates:  Health Maintenance  Topic Date Due  . DEXA scan (bone density measurement)  07/09/2017*  . Tetanus Vaccine  07/10/2026*  . Flu Shot  09/02/2016  . Mammogram  08/12/2017  . Colon Cancer Screening  07/11/2019  .  Hepatitis C: One time screening is recommended by Center for Disease Control  (CDC) for  adults born from [birth year] through 1967.   Completed  . Pneumonia vaccines  Completed  *Topic was postponed. The date shown is not the original due date.   Preventive Care for Adults  A healthy lifestyle and preventive care can promote health and wellness. Preventive health guidelines for adults include the following key practices.  . A routine yearly physical is a good way to check with your health care provider about your health and preventive screening. It is a chance to share any concerns and updates on your health and to receive a thorough exam.  . Visit your dentist for a routine exam and preventive care every 6 months. Brush your teeth twice a day and floss once a day. Good oral hygiene prevents tooth decay and gum disease.  . The frequency of eye exams is based on your age, health, family medical history, use  of contact lenses, and other factors. Follow your health care provider's ecommendations for frequency of eye exams.  . Eat a healthy diet. Foods like vegetables, fruits, whole grains, low-fat dairy products, and lean protein foods contain the  nutrients you need without too many calories. Decrease your intake of foods high in solid fats, added sugars, and salt. Eat the right amount of calories for you. Get information about a proper diet from your health care provider, if necessary.  . Regular physical exercise is one of the most important things you can do for your health. Most adults should get at least 150 minutes of moderate-intensity exercise (any activity that increases your heart rate and causes you to sweat) each week. In addition, most adults need muscle-strengthening exercises on 2 or more days a week.  Silver Sneakers may be a benefit available to you. To determine eligibility, you may visit the website: www.silversneakers.com or contact program at 559-538-3114 Mon-Fri between 8AM-8PM.   . Maintain a healthy weight. The body mass index (BMI) is a screening tool to identify possible weight problems. It provides an estimate of body fat based on height and weight. Your health care provider can find your BMI and can help you achieve or maintain a healthy weight.   For adults 20 years and older: ? A BMI below 18.5 is considered underweight. ? A BMI of 18.5 to 24.9 is normal. ? A BMI of 25 to 29.9 is considered overweight. ? A BMI of 30 and above is considered obese.   . Maintain normal blood lipids and cholesterol levels by exercising and minimizing your intake of saturated  fat. Eat a balanced diet with plenty of fruit and vegetables. Blood tests for lipids and cholesterol should begin at age 58 and be repeated every 5 years. If your lipid or cholesterol levels are high, you are over 50, or you are at high risk for heart disease, you may need your cholesterol levels checked more frequently. Ongoing high lipid and cholesterol levels should be treated with medicines if diet and exercise are not working.  . If you smoke, find out from your health care provider how to quit. If you do not use tobacco, please do not start.  . If you  choose to drink alcohol, please do not consume more than 2 drinks per day. One drink is considered to be 12 ounces (355 mL) of beer, 5 ounces (148 mL) of wine, or 1.5 ounces (44 mL) of liquor.  . If you are 75-48 years old, ask your health care provider if you should take aspirin to prevent strokes.  . Use sunscreen. Apply sunscreen liberally and repeatedly throughout the day. You should seek shade when your shadow is shorter than you. Protect yourself by wearing long sleeves, pants, a wide-brimmed hat, and sunglasses year round, whenever you are outdoors.  . Once a month, do a whole body skin exam, using a mirror to look at the skin on your back. Tell your health care provider of new moles, moles that have irregular borders, moles that are larger than a pencil eraser, or moles that have changed in shape or color.

## 2016-07-10 LAB — HEPATITIS C ANTIBODY: HCV Ab: NEGATIVE

## 2016-07-16 ENCOUNTER — Encounter: Payer: Self-pay | Admitting: Family Medicine

## 2016-07-16 ENCOUNTER — Ambulatory Visit (INDEPENDENT_AMBULATORY_CARE_PROVIDER_SITE_OTHER): Payer: Medicare HMO | Admitting: Family Medicine

## 2016-07-16 ENCOUNTER — Other Ambulatory Visit: Payer: Self-pay | Admitting: Family Medicine

## 2016-07-16 VITALS — BP 102/72 | HR 79 | Ht 63.0 in | Wt 186.0 lb

## 2016-07-16 DIAGNOSIS — Z01419 Encounter for gynecological examination (general) (routine) without abnormal findings: Secondary | ICD-10-CM

## 2016-07-16 DIAGNOSIS — I10 Essential (primary) hypertension: Secondary | ICD-10-CM

## 2016-07-16 DIAGNOSIS — M1990 Unspecified osteoarthritis, unspecified site: Secondary | ICD-10-CM

## 2016-07-16 DIAGNOSIS — E785 Hyperlipidemia, unspecified: Secondary | ICD-10-CM

## 2016-07-16 NOTE — Patient Instructions (Signed)
Great to see you!   

## 2016-07-16 NOTE — Assessment & Plan Note (Signed)
Reviewed preventive care protocols, scheduled due services, and updated immunizations Discussed nutrition, exercise, diet, and healthy lifestyle.  

## 2016-07-16 NOTE — Progress Notes (Signed)
Pre visit review using our clinic review tool, if applicable. No additional management support is needed unless otherwise documented below in the visit note. 

## 2016-07-16 NOTE — Assessment & Plan Note (Signed)
Well controlled. No changes made today. 

## 2016-07-16 NOTE — Assessment & Plan Note (Signed)
Improved this year. No changes made today.

## 2016-07-16 NOTE — Progress Notes (Signed)
Subjective:   Patient ID: Monica Lindsey, female    DOB: [birth year], 71 y.o.   MRN: [id number]  Monica Lindsey is a pleasant 71 y.o. year old female who presents to clinic today with Annual Exam  and follow up of chronic medical conditions on 07/16/2016  HPI:  Annual medicare wellness visit with Candis Musa, RN on 07/09/16. Note reviewed.   Remote h/o hysterectomy Colonoscopy 07/10/09- Dr. Candace Cruise- 10 year recall Zostavax 10/27/07 Pneumovax 03/06/09 Prevnar 13 05/17/13 Influenza vaccine 11/27/15 Mammogram 08/13/15   HTN- on HCTZ 25 mg and amlodipine 5 mg daily- not taking any potassium supplementation. Eats a lot of bananas. Lab Results  Component Value Date   NA 139 07/09/2016   K 4.2 07/09/2016   CL 102 07/09/2016   CO2 30 07/09/2016   Lab Results  Component Value Date   CREATININE 1.03 07/09/2016     HLD- on pravastatin 40 mg daily.  Denies myalgias. Lab Results  Component Value Date   CHOL 197 07/09/2016   HDL 56.50 07/09/2016   LDLCALC 114 (H) 07/09/2016   LDLDIRECT 127.3 03/23/2013   TRIG 131.0 07/09/2016   CHOLHDL 3 07/09/2016     Lab Results  Component Value Date   CREATININE 1.03 07/09/2016   Lab Results  Component Value Date   ALT 24 07/09/2016   AST 23 07/09/2016   ALKPHOS 56 07/09/2016   BILITOT 0.4 07/09/2016   Lab Results  Component Value Date   NA 139 07/09/2016   K 4.2 07/09/2016   CL 102 07/09/2016   CO2 30 07/09/2016        Patient Active Problem List   Diagnosis Date Noted  . Well woman exam 07/16/2016  . Obesity, unspecified 05/17/2013  . HTN (hypertension) 03/23/2013  . HLD (hyperlipidemia) 03/23/2013  . OA (osteoarthritis) 03/23/2013   Past Medical History:  Diagnosis Date  . Frequent headaches   . Hay fever   . Hyperlipidemia   . Hypertension    Past Surgical History:  Procedure Laterality Date  . ABDOMINAL HYSTERECTOMY    . BACK SURGERY  1983  . HEMORRHOID SURGERY    . TOOTH EXTRACTION     Social History    Substance Use Topics  . Smoking status: Never Smoker  . Smokeless tobacco: Never Used  . Alcohol use No   Family History  Problem Relation Age of Onset  . Heart disease Mother   . Alcohol abuse Father   . Arthritis Father   . Heart disease Father   . Stroke Father   . Hypertension Father   . Breast cancer Paternal Aunt 60   Allergies  Allergen Reactions  . Demerol [Meperidine] Other (See Comments)    Makes her "looney"   No current outpatient prescriptions on file prior to visit.   No current facility-administered medications on file prior to visit.    The PMH, PSH, Social History, Family History, Medications, and allergies have been reviewed in [facility], and have been updated if relevant.    Review of Systems  Constitutional: Negative.   HENT: Negative.   Eyes: Negative.   Respiratory: Negative.   Cardiovascular: Negative.   Gastrointestinal: Negative.   Endocrine: Negative.   Genitourinary: Negative.   Musculoskeletal: Negative for arthralgias and myalgias.  Allergic/Immunologic: Negative.   Neurological: Negative.   Hematological: Negative.   Psychiatric/Behavioral: Negative.   All other systems reviewed and are negative.     Objective:    BP 102/72   Pulse 79  Ht 5\' 3"  (1.6 m)   Wt 186 lb (84.4 kg)   SpO2 97%   BMI 32.95 kg/m   Wt Readings from Last 3 Encounters:  07/16/16 186 lb (84.4 kg)  07/09/16 189 lb 12 oz (86.1 kg)  11/27/15 190 lb 8 oz (86.4 kg)    Physical Exam   General:  Well-developed,well-nourished,in no acute distress; alert,appropriate and cooperative throughout examination Head:  normocephalic and atraumatic.   Eyes:  vision grossly intact, PERRL Ears:  R ear normal and L ear normal externally, TMs clear bilaterally Nose:  no external deformity.   Mouth:  good dentition.   Neck:  No deformities, masses, or tenderness noted. Breasts:  No mass, nodules, thickening, tenderness, bulging, retraction, inflamation, nipple discharge  or skin changes noted.   Lungs:  Normal respiratory effort, chest expands symmetrically. Lungs are clear to auscultation, no crackles or wheezes. Heart:  Normal rate and regular rhythm. S1 and S2 normal without gallop, murmur, click, rub or other extra sounds. Abdomen:  Bowel sounds positive,abdomen soft and non-tender without masses, organomegaly or hernias noted. Msk:  No deformity or scoliosis noted of thoracic or lumbar spine.   Extremities:  No clubbing, cyanosis, edema, or deformity noted with normal full range of motion of all joints.   Neurologic:  alert & oriented X3 and gait normal.   Skin:  Intact without suspicious lesions or rashes Cervical Nodes:  No lymphadenopathy noted Axillary Nodes:  No palpable lymphadenopathy Psych:  Cognition and judgment appear intact. Alert and cooperative with normal attention span and concentration. No apparent delusions, illusions, hallucinations       Assessment & Plan:   Hyperlipidemia, unspecified hyperlipidemia type  Essential hypertension  Osteoarthritis, unspecified osteoarthritis type, unspecified site  Well woman exam No Follow-up on file.

## 2016-07-18 NOTE — Progress Notes (Signed)
I reviewed health advisor's note, was available for consultation, and agree with documentation and plan.  

## 2016-08-06 DIAGNOSIS — D2271 Melanocytic nevi of right lower limb, including hip: Secondary | ICD-10-CM | POA: Diagnosis not present

## 2016-08-06 DIAGNOSIS — D2272 Melanocytic nevi of left lower limb, including hip: Secondary | ICD-10-CM | POA: Diagnosis not present

## 2016-08-06 DIAGNOSIS — L820 Inflamed seborrheic keratosis: Secondary | ICD-10-CM | POA: Diagnosis not present

## 2016-08-06 DIAGNOSIS — D225 Melanocytic nevi of trunk: Secondary | ICD-10-CM | POA: Diagnosis not present

## 2016-08-06 DIAGNOSIS — D2262 Melanocytic nevi of left upper limb, including shoulder: Secondary | ICD-10-CM | POA: Diagnosis not present

## 2016-08-06 DIAGNOSIS — L538 Other specified erythematous conditions: Secondary | ICD-10-CM | POA: Diagnosis not present

## 2016-08-10 ENCOUNTER — Ambulatory Visit (INDEPENDENT_AMBULATORY_CARE_PROVIDER_SITE_OTHER)
Admission: RE | Admit: 2016-08-10 | Discharge: 2016-08-10 | Disposition: A | Discharge status: Home / Self Care | Payer: Medicare HMO | Source: Ambulatory Visit | Attending: Family Medicine | Admitting: Family Medicine

## 2016-08-10 ENCOUNTER — Ambulatory Visit (INDEPENDENT_AMBULATORY_CARE_PROVIDER_SITE_OTHER): Payer: Medicare HMO | Admitting: Family Medicine

## 2016-08-10 ENCOUNTER — Encounter: Payer: Self-pay | Admitting: Family Medicine

## 2016-08-10 VITALS — BP 154/84 | HR 74 | Temp 97.9°F | Ht 63.0 in | Wt 189.5 lb

## 2016-08-10 DIAGNOSIS — M25552 Pain in left hip: Secondary | ICD-10-CM | POA: Diagnosis not present

## 2016-08-10 DIAGNOSIS — S3993XA Unspecified injury of pelvis, initial encounter: Secondary | ICD-10-CM | POA: Diagnosis not present
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
## 2016-09-30 ENCOUNTER — Other Ambulatory Visit: Payer: Self-pay | Admitting: Family Medicine

## 2016-09-30 DIAGNOSIS — Z1231 Encounter for screening mammogram for malignant neoplasm of breast: Secondary | ICD-10-CM

## 2016-10-13 ENCOUNTER — Ambulatory Visit
Admission: RE | Admit: 2016-10-13 | Discharge: 2016-10-13 | Disposition: A | Discharge status: Home / Self Care | Payer: Medicare HMO | Source: Ambulatory Visit | Attending: Family Medicine | Admitting: Family Medicine

## 2016-10-13 DIAGNOSIS — Z1231 Encounter for screening mammogram for malignant neoplasm of breast: Secondary | ICD-10-CM

## 2016-11-26 ENCOUNTER — Ambulatory Visit: Payer: Medicare HMO | Admitting: Family Medicine

## 2016-12-01 ENCOUNTER — Encounter: Payer: Self-pay | Admitting: Family Medicine

## 2016-12-01 ENCOUNTER — Ambulatory Visit (INDEPENDENT_AMBULATORY_CARE_PROVIDER_SITE_OTHER): Payer: Medicare HMO | Admitting: Family Medicine

## 2016-12-01 VITALS — BP 148/94 | HR 97 | Temp 97.7°F | Ht 63.0 in | Wt 189.0 lb

## 2016-12-01 DIAGNOSIS — S79912S Unspecified injury of left hip, sequela: Secondary | ICD-10-CM | POA: Diagnosis not present

## 2016-12-01 DIAGNOSIS — R42 Dizziness and giddiness: Secondary | ICD-10-CM | POA: Diagnosis not present

## 2016-12-01 DIAGNOSIS — Z23 Encounter for immunization: Secondary | ICD-10-CM

## 2016-12-01 DIAGNOSIS — M1990 Unspecified osteoarthritis, unspecified site: Secondary | ICD-10-CM

## 2016-12-01 DIAGNOSIS — M25552 Pain in left hip: Secondary | ICD-10-CM | POA: Diagnosis not present

## 2016-12-01 NOTE — Assessment & Plan Note (Signed)
Discussed scheduling tylenol.

## 2016-12-01 NOTE — Patient Instructions (Signed)

## 2016-12-01 NOTE — Assessment & Plan Note (Signed)
Discussed home eply maneuver- see AVS. Call or return to clinic prn if these symptoms worsen or fail to improve as anticipated. The patient indicates understanding of these issues and agrees with the plan.
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
## 2016-12-01 NOTE — Assessment & Plan Note (Signed)
Discussed with pt. Since it is getting better, she wants to hold off on contacting Dr. Lorelei Pont about an MRI. She will do so if symptoms do not continue to improve.

## 2016-12-09 ENCOUNTER — Ambulatory Visit: Payer: Medicare HMO | Admitting: Family Medicine

## 2016-12-09 ENCOUNTER — Encounter: Payer: Self-pay | Admitting: Family Medicine

## 2016-12-09 VITALS — BP 120/88 | HR 85 | Temp 97.8°F | Ht 63.0 in | Wt 188.8 lb

## 2016-12-09 DIAGNOSIS — G5603 Carpal tunnel syndrome, bilateral upper limbs: Secondary | ICD-10-CM

## 2016-12-09 DIAGNOSIS — M1811 Unilateral primary osteoarthritis of first carpometacarpal joint, right hand: Secondary | ICD-10-CM

## 2016-12-09 MED ORDER — METHYLPREDNISOLONE ACETATE 40 MG/ML IJ SUSP
20.0000 mg | Freq: Once | INTRAMUSCULAR | Status: AC
  Administered 2016-12-09: 20 mg via INTRA_ARTICULAR

## 2016-12-09 NOTE — Progress Notes (Signed)
Dr. Frederico Hamman T. Latasha Puskas, MD, [facility] [place], [address] Phone: [phone] Fax: [phone]  12/09/2016  Patient: Monica Lindsey, MRN: [id number], DOB: May 29, 1945, 71 y.o.  Primary Physician:  Lucille Passy, MD   Chief Complaint  Patient presents with  . Thumb Pain    Bilateral   Subjective:   Monica Lindsey is a 71 y.o. very pleasant female patient who presents with the following:  Thumb joints - cannot open a car door. Trouble with basic function and movement and pain in the thumb. She has had problems with her thumbs and hands ongoing for at least 10-20 years. Dr. Sabra Heck did a thumb injection about 5 years ago that brought significant relief for several years. She also has had some numbness and tingling.  Arthritis - mcp OA b.   Past Medical History, Surgical History, Social History, Family History, Problem List, Medications, and Allergies have been reviewed and updated if relevant.  Patient Active Problem List   Diagnosis Date Noted  . Hip injury, left, sequela 12/01/2016  . Vertigo 12/01/2016  . Obesity, unspecified 05/17/2013  . HTN (hypertension) 03/23/2013  . HLD (hyperlipidemia) 03/23/2013  . OA (osteoarthritis) 03/23/2013    Past Medical History:  Diagnosis Date  . Frequent headaches   . Hay fever   . Hyperlipidemia   . Hypertension     Past Surgical History:  Procedure Laterality Date  . ABDOMINAL HYSTERECTOMY    . BACK SURGERY  1983  . HEMORRHOID SURGERY    . TOOTH EXTRACTION      Social History   Socioeconomic History  . Marital status: Widowed    Spouse name: Not on file  . Number of children: Not on file  . Years of education: Not on file  . Highest education level: Not on file  Social Needs  . Financial resource strain: Not on file  . Food insecurity - worry: Not on file  . Food insecurity - inability: Not on file  . Transportation needs - medical: Not on file  .  Transportation needs - non-medical: Not on file  Occupational History  . Not on file  Tobacco Use  . Smoking status: Never Smoker  . Smokeless tobacco: Never Used  Substance and Sexual Activity  . Alcohol use: No  . Drug use: No  . Sexual activity: No  Other Topics Concern  . 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   Widowed.      Still working 3 days per week.  Very close to her children and grand children.      Daughter, [name], is HPOA.  Has a living will.   Would desire CPR, would not desire prolonged life support if futile.             Family History  Problem Relation Age of Onset  . Heart disease Mother   . Alcohol abuse Father   . Arthritis Father   . Heart disease Father   . Stroke Father   . Hypertension Father   . Breast cancer Paternal Aunt 60    Allergies  Allergen Reactions  . Demerol [Meperidine] Other (See Comments)    Makes her "looney"    Medication list reviewed and updated in full in [place].  GEN: no acute illness or fever CV: No chest pain or shortness of breath MSK: detailed above Neuro:  neurological signs are described above ROS O/w per HPI  Objective:   BP 120/88   Pulse 85   Temp 97.8 F (36.6 C) (Oral)   Ht 5\' 3"  (1.6 m)   Wt 188 lb 12 oz (85.6 kg)   BMI 33.44 kg/m    GEN: WDWN, NAD, Non-toxic, Alert & Oriented x 3 HEENT: Atraumatic, Normocephalic.  Ears and Nose: No external deformity. EXTR: No clubbing/cyanosis/edema NEURO: Normal gait.  PSYCH: Normally interactive. Conversant. Not depressed or anxious appearing.  Calm demeanor.    Extensive osteoarthritic changes are out both hands basically all joints of the DIP, PIP, MCP as well as CMC joints. There is some restriction at the wrist. Phalen's and Tinel's tests are also positive. Decreased grip strength and pincher grasp  Radiology: No results found.  Assessment and Plan:   Osteoarthritis of right  thumb - Plan: methylPREDNISolone acetate (DEPO-MEDROL) injection 20 mg  Carpal tunnel syndrome, bilateral  Primary issue is R MCP OA, with diffuse hand OA and CTS B  Reasonable to try a therapeutic and diagnostic MCP injection. I used [place] to help mark the joint.   MCP joint injection, 1st R Verbal consent was obtained. Risks (including rare infection, pain), benefits, and alternatives were discussed. Prepped with Chloraprep and Ethyl Chloride used for anesthesia. [place] used to find joint line. Under sterile conditions, patient injected into 1st MCP joint at joint line inserting perpendicularly with traction placed on thumb. Aspiration yields no blood. Decreased pain post injection. No complications. Needle size: 25 gauge Injection: 1/2 cc of Lidocaine 1% and Depo-Medrol 20 mg   Follow-up: prn  Future Appointments  Date Time Provider [place]  07/14/2017  9:00 AM Eustace Pen, LPN [id number]  10/27/4626  8:30 AM Lucille Passy, MD [facility]    Meds ordered this encounter  Medications  . methylPREDNISolone acetate (DEPO-MEDROL) injection 20 mg   Signed,  Coraima Tibbs T. Dartanyan Deasis, MD   Allergies as of 12/09/2016      Reactions   Demerol [meperidine] Other (See Comments)   Makes her "looney"      Medication List        Accurate as of 12/09/16  1:49 PM. Always use your most recent med list.          amLODipine 5 MG tablet Commonly known as:  NORVASC TAKE 1 TABLET (5 MG TOTAL) BY MOUTH DAILY.   Fish Oil 1000 MG Caps Take by mouth.   hydrochlorothiazide 25 MG tablet Commonly known as:  HYDRODIURIL TAKE 1 TABLET EVERY DAY   pravastatin 40 MG tablet Commonly known as:  PRAVACHOL TAKE 1 TABLET (40 MG TOTAL) BY MOUTH DAILY.

## 2017-01-05 DIAGNOSIS — M13842 Other specified arthritis, left hand: Secondary | ICD-10-CM | POA: Diagnosis not present

## 2017-01-05 DIAGNOSIS — M13841 Other specified arthritis, right hand: Secondary | ICD-10-CM | POA: Diagnosis not present

## 2017-02-10 DIAGNOSIS — H2513 Age-related nuclear cataract, bilateral: Secondary | ICD-10-CM | POA: Diagnosis not present

## 2017-02-10 DIAGNOSIS — H524 Presbyopia: Secondary | ICD-10-CM | POA: Diagnosis not present

## 2017-02-10 DIAGNOSIS — I10 Essential (primary) hypertension: Secondary | ICD-10-CM | POA: Diagnosis not present

## 2017-02-10 DIAGNOSIS — H5203 Hypermetropia, bilateral: Secondary | ICD-10-CM | POA: Diagnosis not present

## 2017-02-10 DIAGNOSIS — H40013 Open angle with borderline findings, low risk, bilateral: Secondary | ICD-10-CM | POA: Diagnosis not present

## 2017-02-10 DIAGNOSIS — H52223 Regular astigmatism, bilateral: Secondary | ICD-10-CM | POA: Diagnosis not present

## 2017-03-03 ENCOUNTER — Other Ambulatory Visit: Payer: Self-pay | Admitting: Family Medicine

## 2017-03-26 ENCOUNTER — Telehealth: Payer: Self-pay | Admitting: Family Medicine

## 2017-03-26 DIAGNOSIS — M13841 Other specified arthritis, right hand: Secondary | ICD-10-CM | POA: Diagnosis not present

## 2017-03-26 DIAGNOSIS — M13842 Other specified arthritis, left hand: Secondary | ICD-10-CM | POA: Diagnosis not present

## 2017-03-26 NOTE — Telephone Encounter (Signed)
VB-Can you see about scheduling this pt for her AWV with you prior to seeing Dr. Deborra Medina? Plz advise/thx dmf

## 2017-03-26 NOTE — Telephone Encounter (Signed)
Pt called wanting to schedule her medicare wellness visit prior to her cpx with dr Deborra Medina

## 2017-03-30 ENCOUNTER — Other Ambulatory Visit: Payer: Self-pay | Admitting: Specialist

## 2017-03-30 NOTE — Telephone Encounter (Signed)
LM for patient to return call to schedule AWV.   

## 2017-03-31 ENCOUNTER — Telehealth: Payer: Self-pay

## 2017-03-31 ENCOUNTER — Other Ambulatory Visit: Payer: Self-pay

## 2017-03-31 ENCOUNTER — Encounter
Admission: RE | Admit: 2017-03-31 | Discharge: 2017-03-31 | Disposition: A | Discharge status: Home / Self Care | Payer: Medicare HMO | Source: Ambulatory Visit | Attending: Specialist | Admitting: Specialist

## 2017-03-31 HISTORY — DX: Constipation, unspecified: K59.00

## 2017-03-31 HISTORY — DX: Unspecified osteoarthritis, unspecified site: M19.90

## 2017-03-31 NOTE — Patient Instructions (Signed)
Your procedure is scheduled on: 04/07/17 Wed Report to Same Day Surgery 2nd floor medical mall [facility] Entrance-take elevator on left to 2nd floor.  Check in with surgery information desk.) To find out your arrival time please call [phone] between 1PM - 3PM on 04/06/17 Tues  Remember: Instructions that are not followed completely may result in serious medical risk, up to and including death, or upon the discretion of your surgeon and anesthesiologist your surgery may need to be rescheduled.    _x___ 1. Do not eat food after midnight the night before your procedure. You may drink clear liquids up to 2 hours before you are scheduled to arrive at the hospital for your procedure.  Do not drink clear liquids within 2 hours of your scheduled arrival to the hospital.  Clear liquids include  --Water or Apple juice without pulp  --Clear carbohydrate beverage such as ClearFast or Gatorade  --Black Coffee or Clear Tea (No milk, no creamers, do not add anything to                  the coffee or Tea Type 1 and type 2 diabetics should only drink water.  No gum chewing or hard candies.     __x__ 2. No Alcohol for 24 hours before or after surgery.   __x__3. No Smoking or e-cigarettes for 24 prior to surgery.  Do not use any chewable tobacco products for at least 6 hour prior to surgery   ____  4. Bring all medications with you on the day of surgery if instructed.    __x__ 5. Notify your doctor if there is any change in your medical condition     (cold, fever, infections).    x___6. On the morning of surgery brush your teeth with toothpaste and water.  You may rinse your mouth with mouth wash if you wish.  Do not swallow any toothpaste or mouthwash.   Do not wear jewelry, make-up, hairpins, clips or nail polish.  Do not wear lotions, powders, or perfumes. You may wear deodorant.  Do not shave 48 hours prior to surgery. Men may shave face and neck.  Do not bring valuables to the hospital.     [facility] is not responsible for any belongings or valuables.               Contacts, dentures or bridgework may not be worn into surgery.  Leave your suitcase in the car. After surgery it may be brought to your room.  For patients admitted to the hospital, discharge time is determined by your                       treatment team.  _  Patients discharged the day of surgery will not be allowed to drive home.  You will need someone to drive you home and stay with you the night of your procedure.    Please read over the following fact sheets that you were given:   [facility] Preparing for Surgery and or MRSA Information   _x___ Take anti-hypertensive listed below, cardiac, seizure, asthma,     anti-reflux and psychiatric medicines. These include:  1. Nonw  2.  3.  4.  5.  6.  ____Fleets enema or Magnesium Citrate as directed.   _x___ Use CHG Soap or sage wipes as directed on instruction sheet   ____ Use inhalers on the day of surgery and bring to hospital day of surgery  ____ Stop Metformin and Janumet 2 days prior to surgery.    ____ Take 1/2 of usual insulin dose the night before surgery and none on the morning     surgery.   _x___ Follow recommendations from Cardiologist, Pulmonologist or PCP regarding          stopping Aspirin, Coumadin, Plavix ,Eliquis, Effient, or Pradaxa, and Pletal. To stop Aspirin 1 week before surgery per physician  X____Stop Anti-inflammatories such as Advil, Aleve, Ibuprofen, Motrin, Naproxen, Naprosyn, Goodies powders or aspirin products. OK to take Tylenol and                          Celebrex.    _x___ Stop supplements until after surgery.  But may continue Vitamin D, Vitamin B,       and multivitamin. To stop fish oil 1 week before surgery.   ____ Bring C-Pap to the hospital.

## 2017-03-31 NOTE — Telephone Encounter (Signed)
Patient has an appointment for tomorrow, 2/28, for surgical clearance.   Copied from [place] [id number]. Topic: Inquiry >> Mar 26, 2017  3:48 PM Margot Ables wrote: Reason for CRM: pt called stating she has appt scheduled for 04/07/17 on R thumb - she was advised by ortho to contact Dr. Deborra Medina to see if she needs surgical clearance appt - please advise pt

## 2017-04-01 ENCOUNTER — Encounter: Payer: Self-pay | Admitting: Family Medicine

## 2017-04-01 ENCOUNTER — Inpatient Hospital Stay: Admission: RE | Admit: 2017-04-01 | Payer: Medicare HMO | Source: Ambulatory Visit

## 2017-04-01 ENCOUNTER — Ambulatory Visit (INDEPENDENT_AMBULATORY_CARE_PROVIDER_SITE_OTHER): Payer: Medicare HMO | Admitting: Family Medicine

## 2017-04-01 VITALS — BP 122/86 | HR 73 | Temp 97.8°F | Ht 64.0 in | Wt 188.0 lb

## 2017-04-01 DIAGNOSIS — Z01810 Encounter for preprocedural cardiovascular examination: Secondary | ICD-10-CM | POA: Diagnosis not present

## 2017-04-01 LAB — CBC WITH DIFFERENTIAL/PLATELET
BASOS ABS: 0.1 10*3/uL (ref 0.0–0.1)
Basophils Relative: 1.1 % (ref 0.0–3.0)
EOS ABS: 0.2 10*3/uL (ref 0.0–0.7)
Eosinophils Relative: 4 % (ref 0.0–5.0)
HCT: 40.5 % (ref 36.0–46.0)
Hemoglobin: 13.3 g/dL (ref 12.0–15.0)
LYMPHS ABS: 1.6 10*3/uL (ref 0.7–4.0)
LYMPHS PCT: 30.5 % (ref 12.0–46.0)
MCHC: 32.8 g/dL (ref 30.0–36.0)
MCV: 85.5 fl (ref 78.0–100.0)
Monocytes Absolute: 0.4 10*3/uL (ref 0.1–1.0)
Monocytes Relative: 8.4 % (ref 3.0–12.0)
NEUTROS ABS: 2.9 10*3/uL (ref 1.4–7.7)
NEUTROS PCT: 56 % (ref 43.0–77.0)
PLATELETS: 273 10*3/uL (ref 150.0–400.0)
RBC: 4.73 Mil/uL (ref 3.87–5.11)
RDW: 13.9 % (ref 11.5–15.5)
WBC: 5.2 10*3/uL (ref 4.0–10.5)

## 2017-04-01 LAB — COMPREHENSIVE METABOLIC PANEL
ALT: 14 U/L (ref 0–35)
AST: 17 U/L (ref 0–37)
Albumin: 4.1 g/dL (ref 3.5–5.2)
Alkaline Phosphatase: 61 U/L (ref 39–117)
BILIRUBIN TOTAL: 0.6 mg/dL (ref 0.2–1.2)
BUN: 23 mg/dL (ref 6–23)
CO2: 30 meq/L (ref 19–32)
CREATININE: 0.83 mg/dL (ref 0.40–1.20)
Calcium: 10.2 mg/dL (ref 8.4–10.5)
Chloride: 104 mEq/L (ref 96–112)
GFR: 71.55 mL/min (ref >60.00)
GLUCOSE: 87 mg/dL (ref 70–99)
Potassium: 4.2 mEq/L (ref 3.5–5.1)
Sodium: 141 mEq/L (ref 135–145)
TOTAL PROTEIN: 6.9 g/dL (ref 6.0–8.3)

## 2017-04-01 NOTE — Patient Instructions (Signed)
Great to see you. I will call you with your lab results from today and you can view them online.   

## 2017-04-01 NOTE — Progress Notes (Signed)
Subjective:    Monica Lindsey is a 72 y.o. female who presents to the office today for a preoperative consultation at the request of surgeon Dr. Earnestine Leys who plans on performing carpometacarpal fusion of the right thumb on March 6.  Planned anesthesia: general. The patient has the following known anesthesia issues: none.   Current Outpatient Medications on File Prior to Visit  Medication Sig Dispense Refill  . amLODipine (NORVASC) 5 MG tablet TAKE 1 TABLET EVERY DAY (Patient taking differently: Take 5 mg by mouth at bedtime) 90 tablet 2  . aspirin EC 81 MG tablet Take 81 mg by mouth daily.    . Cyanocobalamin (VITAMIN B-12 PO) Place 1 tablet under the tongue daily.     . hydrochlorothiazide (HYDRODIURIL) 25 MG tablet TAKE 1 TABLET EVERY DAY (Patient taking differently: Take 25 mg by mouth daily) 90 tablet 2  . Multiple Vitamin (MULTIVITAMIN) tablet Take 1 tablet by mouth 2 (two) times daily.    . Omega-3 Fatty Acids (FISH OIL) 1000 MG CAPS Take 1,000 mg by mouth 2 (two) times daily.     . pravastatin (PRAVACHOL) 40 MG tablet TAKE 1 TABLET EVERY DAY (Patient taking differently: Take 40 mg by mouth at bedtime) 90 tablet 2   No current facility-administered medications on file prior to visit.     Allergies  Allergen Reactions  . Demerol [Meperidine] Other (See Comments)    Makes her "looney"    Past Medical History:  Diagnosis Date  . Arthritis   . Constipation   . Frequent headaches   . Hay fever   . Hyperlipidemia   . Hypertension     Past Surgical History:  Procedure Laterality Date  . ABDOMINAL HYSTERECTOMY    . BACK SURGERY  1983  . HEMORRHOID SURGERY    . TOOTH EXTRACTION      Family History  Problem Relation Age of Onset  . Heart disease Mother   . Alcohol abuse Father   . Arthritis Father   . Heart disease Father   . Stroke Father   . Hypertension Father   . Breast cancer Paternal Aunt 51    Social History   Socioeconomic History  . Marital status:  Widowed    Spouse name: Not on file  . Number of children: Not on file  . Years of education: Not on file  . Highest education level: Not on file  Social Needs  . Financial resource strain: Not on file  . Food insecurity - worry: Not on file  . Food insecurity - inability: Not on file  . Transportation needs - medical: Not on file  . Transportation needs - non-medical: Not on file  Occupational History  . Not on file  Tobacco Use  . Smoking status: Never Smoker  . Smokeless tobacco: Never Used  Substance and Sexual Activity  . Alcohol use: Yes    Comment: rare  . Drug use: No  . Sexual activity: No  Other Topics Concern  . 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      Still working 3 days per week.  Very close to her children and grand children.      Daughter, [name], is HPOA.  Has a living will.   Would desire CPR, would not desire prolonged life support if futile.            The PMH, PSH, Social History,  Family History, Medications, and allergies have been reviewed in [facility], and have been updated if relevant.   Review of Systems Pertinent items are noted in HPI.    Objective:    BP 122/86   Pulse 73   Temp 97.8 F (36.6 C)   Ht 5\' 4"  (1.626 m)   Wt 188 lb (85.3 kg)   SpO2 97%   BMI 32.27 kg/m   General Appearance:    Alert, cooperative, no distress, appears stated age  Head:    Normocephalic, without obvious abnormality, atraumatic  Eyes:    PERRL, conjunctiva/corneas clear, EOM's intact, fundi    benign, both eyes  Ears:    Normal TM's and external ear canals, both ears  Nose:   Nares normal, septum midline, mucosa normal, no drainage    or sinus tenderness  Throat:   Lips, mucosa, and tongue normal; teeth and gums normal  Neck:   Supple, symmetrical, trachea midline, no adenopathy;    thyroid:  no enlargement/tenderness/nodules; no carotid   bruit or JVD  Back:     Symmetric, no  curvature, ROM normal, no CVA tenderness  Lungs:     Clear to auscultation bilaterally, respirations unlabored  Chest Wall:    No tenderness or deformity   Heart:    Regular rate and rhythm, S1 and S2 normal, no murmur, rub   or gallop  Extremities:   Extremities normal, atraumatic, no cyanosis or edema  Pulses:   2+ and symmetric all extremities  Skin:   Skin color, texture, turgor normal, no rashes or lesions  Lymph nodes:   Cervical, supraclavicular, and axillary nodes normal  Neurologic:   CNII-XII intact, normal strength, sensation and reflexes    throughout     Cardiographics ECG: sinus rhythm- left axis anterior fasicular block, unchanged from prior EKG from 05/07/09   Lab Review  No visits with results within 2 Month(s) from this visit.  Latest known visit with results is:  Clinical Support on 07/09/2016  Component Date Value  . HCV Ab 07/09/2016 NEGATIVE   . TSH 07/09/2016 0.63   . Cholesterol 07/09/2016 197   . Triglycerides 07/09/2016 131.0   . HDL 07/09/2016 56.50   . VLDL 07/09/2016 26.2   . LDL Cholesterol 07/09/2016 114*  . Total CHOL/HDL Ratio 07/09/2016 3   . NonHDL 07/09/2016 140.09   . WBC 07/09/2016 6.5   . RBC 07/09/2016 4.84   . Hemoglobin 07/09/2016 14.8   . HCT 07/09/2016 44.0   . MCV 07/09/2016 90.9   . MCHC 07/09/2016 33.6   . RDW 07/09/2016 13.6   . Platelets 07/09/2016 263.0   . Neutrophils Relative % 07/09/2016 58.1   . Lymphocytes Relative 07/09/2016 31.8   . Monocytes Relative 07/09/2016 7.6   . Eosinophils Relative 07/09/2016 1.9   . Basophils Relative 07/09/2016 0.6   . Neutro Abs 07/09/2016 3.8   . Lymphs Abs 07/09/2016 2.1   . Monocytes Absolute 07/09/2016 0.5   . Eosinophils Absolute 07/09/2016 0.1   . Basophils Absolute 07/09/2016 0.0   . Sodium 07/09/2016 139   . Potassium 07/09/2016 4.2   . Chloride 07/09/2016 102   . CO2 07/09/2016 30   . Glucose, Bld 07/09/2016 95   . BUN 07/09/2016 29*  . Creatinine, Ser 07/09/2016 1.03    . Total Bilirubin 07/09/2016 0.4   . Alkaline Phosphatase 07/09/2016 56   . AST 07/09/2016 23   . ALT 07/09/2016 24   . Total Protein 07/09/2016 7.8   .  Albumin 07/09/2016 4.7   . Calcium 07/09/2016 10.4   . GFR 07/09/2016 55.89*      Assessment/Plan:      72 y.o. female with planned surgery as above.   Known risk factors for perioperative complications: None   Difficulty with intubation is anticipated.  Cardiac Risk Estimation: low  Orders Placed This Encounter  Procedures  . CBC with Differential/Platelet  . Comprehensive metabolic panel  . EKG 12-Lead

## 2017-04-02 NOTE — Pre-Procedure Instructions (Signed)
Lucille Passy, MD  Physician  Family Medicine  Progress Notes  Signed  Encounter Date:  04/01/2017          Signed            [] Hide copied text  [] Hover for details    Subjective:    Monica Lindsey is a 72 y.o. female who presents to the office today for a preoperative consultation at the request of surgeon Dr. Earnestine Leys who plans on performing carpometacarpal fusion of the right thumb on March 6.  Planned anesthesia: general. The patient has the following known anesthesia issues: none.         Current Outpatient Medications on File Prior to Visit  Medication Sig Dispense Refill  . amLODipine (NORVASC) 5 MG tablet TAKE 1 TABLET EVERY DAY (Patient taking differently: Take 5 mg by mouth at bedtime) 90 tablet 2  . aspirin EC 81 MG tablet Take 81 mg by mouth daily.    . Cyanocobalamin (VITAMIN B-12 PO) Place 1 tablet under the tongue daily.     . hydrochlorothiazide (HYDRODIURIL) 25 MG tablet TAKE 1 TABLET EVERY DAY (Patient taking differently: Take 25 mg by mouth daily) 90 tablet 2  . Multiple Vitamin (MULTIVITAMIN) tablet Take 1 tablet by mouth 2 (two) times daily.    . Omega-3 Fatty Acids (FISH OIL) 1000 MG CAPS Take 1,000 mg by mouth 2 (two) times daily.     . pravastatin (PRAVACHOL) 40 MG tablet TAKE 1 TABLET EVERY DAY (Patient taking differently: Take 40 mg by mouth at bedtime) 90 tablet 2   No current facility-administered medications on file prior to visit.          Allergies  Allergen Reactions  . Demerol [Meperidine] Other (See Comments)    Makes her "looney"        Past Medical History:  Diagnosis Date  . Arthritis   . Constipation   . Frequent headaches   . Hay fever   . Hyperlipidemia   . Hypertension          Past Surgical History:  Procedure Laterality Date  . ABDOMINAL HYSTERECTOMY    . BACK SURGERY  1983  . HEMORRHOID SURGERY    . TOOTH EXTRACTION           Family History  Problem Relation Age of  Onset  . Heart disease Mother   . Alcohol abuse Father   . Arthritis Father   . Heart disease Father   . Stroke Father   . Hypertension Father   . Breast cancer Paternal Aunt 34    Social History        Socioeconomic History  . Marital status: Widowed    Spouse name: Not on file  . Number of children: Not on file  . Years of education: Not on file  . Highest education level: Not on file  Social Needs  . Financial resource strain: Not on file  . Food insecurity - worry: Not on file  . Food insecurity - inability: Not on file  . Transportation needs - medical: Not on file  . Transportation needs - non-medical: Not on file  Occupational History  . Not on file  Tobacco Use  . Smoking status: Never Smoker  . Smokeless tobacco: Never Used  Substance and Sexual Activity  . Alcohol use: Yes    Comment: rare  . Drug use: No  . Sexual activity: No  Other Topics Concern  . 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      Still working 3 days per week.  Very close to her children and grand children.      Daughter, [name], is HPOA.  Has a living will.   Would desire CPR, would not desire prolonged life support if futile.            The PMH, PSH, Social History, Family History, Medications, and allergies have been reviewed in [facility], and have been updated if relevant.   Review of Systems Pertinent items are noted in HPI.    Objective:    BP 122/86   Pulse 73   Temp 97.8 F (36.6 C)   Ht 5\' 4"  (1.626 m)   Wt 188 lb (85.3 kg)   SpO2 97%   BMI 32.27 kg/m   General Appearance:    Alert, cooperative, no distress, appears stated age  Head:    Normocephalic, without obvious abnormality, atraumatic  Eyes:    PERRL, conjunctiva/corneas clear, EOM's intact, fundi    benign, both eyes  Ears:    Normal TM's and external ear canals, both ears  Nose:   Nares normal, septum  midline, mucosa normal, no drainage    or sinus tenderness  Throat:   Lips, mucosa, and tongue normal; teeth and gums normal  Neck:   Supple, symmetrical, trachea midline, no adenopathy;    thyroid:  no enlargement/tenderness/nodules; no carotid   bruit or JVD  Back:     Symmetric, no curvature, ROM normal, no CVA tenderness  Lungs:     Clear to auscultation bilaterally, respirations unlabored  Chest Wall:    No tenderness or deformity   Heart:    Regular rate and rhythm, S1 and S2 normal, no murmur, rub   or gallop  Extremities:   Extremities normal, atraumatic, no cyanosis or edema  Pulses:   2+ and symmetric all extremities  Skin:   Skin color, texture, turgor normal, no rashes or lesions  Lymph nodes:   Cervical, supraclavicular, and axillary nodes normal  Neurologic:   CNII-XII intact, normal strength, sensation and reflexes    throughout     Cardiographics ECG: sinus rhythm- left axis anterior fasicular block, unchanged from prior EKG from 05/07/09   Lab Review       No visits with results within 2 Month(s) from this visit.  Latest known visit with results is:  Clinical Support on 07/09/2016  Component Date Value  . HCV Ab 07/09/2016 NEGATIVE   . TSH 07/09/2016 0.63   . Cholesterol 07/09/2016 197   . Triglycerides 07/09/2016 131.0   . HDL 07/09/2016 56.50   . VLDL 07/09/2016 26.2   . LDL Cholesterol 07/09/2016 114*  . Total CHOL/HDL Ratio 07/09/2016 3   . NonHDL 07/09/2016 140.09   . WBC 07/09/2016 6.5   . RBC 07/09/2016 4.84   . Hemoglobin 07/09/2016 14.8   . HCT 07/09/2016 44.0   . MCV 07/09/2016 90.9   . MCHC 07/09/2016 33.6   . RDW 07/09/2016 13.6   . Platelets 07/09/2016 263.0   . Neutrophils Relative % 07/09/2016 58.1   . Lymphocytes Relative 07/09/2016 31.8   . Monocytes Relative 07/09/2016 7.6   . Eosinophils Relative 07/09/2016 1.9   . Basophils Relative 07/09/2016 0.6   . Neutro Abs 07/09/2016 3.8   . Lymphs Abs 07/09/2016 2.1   . Monocytes  Absolute 07/09/2016 0.5   . Eosinophils Absolute 07/09/2016 0.1   .  Basophils Absolute 07/09/2016 0.0   . Sodium 07/09/2016 139   . Potassium 07/09/2016 4.2   . Chloride 07/09/2016 102   . CO2 07/09/2016 30   . Glucose, Bld 07/09/2016 95   . BUN 07/09/2016 29*  . Creatinine, Ser 07/09/2016 1.03   . Total Bilirubin 07/09/2016 0.4   . Alkaline Phosphatase 07/09/2016 56   . AST 07/09/2016 23   . ALT 07/09/2016 24   . Total Protein 07/09/2016 7.8   . Albumin 07/09/2016 4.7   . Calcium 07/09/2016 10.4   . GFR 07/09/2016 55.89*    Assessment/Plan:      72 y.o. female with planned surgery as above.   Known risk factors for perioperative complications: None   Difficulty with intubation is anticipated.  Cardiac Risk Estimation: low     Orders Placed This Encounter  Procedures  . CBC with Differential/Platelet  . Comprehensive metabolic panel  . EKG 12-Lead              Electronically signed by Lucille Passy, MD at 04/01/2017 9:49 AM     Office Visit on 04/01/2017        Detailed Report

## 2017-04-06 ENCOUNTER — Telehealth: Payer: Self-pay

## 2017-04-06 MED ORDER — CLINDAMYCIN PHOSPHATE 600 MG/50ML IV SOLN
600.0000 mg | INTRAVENOUS | Status: AC
  Administered 2017-04-07: 600 mg via INTRAVENOUS

## 2017-04-06 MED ORDER — CEFAZOLIN SODIUM-DEXTROSE 2-4 GM/100ML-% IV SOLN
2.0000 g | INTRAVENOUS | Status: AC
  Administered 2017-04-07: 2 g via INTRAVENOUS

## 2017-04-06 NOTE — Telephone Encounter (Signed)
Faxed/thx dmf

## 2017-04-06 NOTE — Telephone Encounter (Signed)
Copied from [place] [id number]. Topic: Inquiry >> Apr 06, 2017 10:14 AM Arletha Grippe wrote: Reason for BRA:XENMMH with [facility] called - she is asking about surgical clearance form.  It was sent on 03/26/17. She has questions whether or not the pt needs to be seen by cardio.  [facility] has sent over a form that needs fille out and faxed back selecting if pt is low, medium, or high risk.  Her surgery is scheduled for tomorrow, so they need this info asap.  Fax number is [phone] Cb number is [phone] ext [phone]

## 2017-04-07 ENCOUNTER — Ambulatory Visit
Admission: RE | Admit: 2017-04-07 | Discharge: 2017-04-07 | Disposition: A | Discharge status: Home / Self Care | Payer: Medicare HMO | Source: Ambulatory Visit | Attending: Specialist | Admitting: Specialist

## 2017-04-07 ENCOUNTER — Ambulatory Visit: Payer: Medicare HMO | Admitting: Anesthesiology

## 2017-04-07 ENCOUNTER — Other Ambulatory Visit: Payer: Self-pay

## 2017-04-07 ENCOUNTER — Encounter
Admission: RE | Disposition: A | Discharge status: Home / Self Care | Payer: Self-pay | Source: Ambulatory Visit | Attending: Specialist

## 2017-04-07 ENCOUNTER — Encounter: Payer: Self-pay | Admitting: *Deleted

## 2017-04-07 DIAGNOSIS — I10 Essential (primary) hypertension: Secondary | ICD-10-CM | POA: Insufficient documentation

## 2017-04-07 DIAGNOSIS — M13841 Other specified arthritis, right hand: Secondary | ICD-10-CM | POA: Diagnosis not present

## 2017-04-07 DIAGNOSIS — Z79899 Other long term (current) drug therapy: Secondary | ICD-10-CM | POA: Diagnosis not present

## 2017-04-07 DIAGNOSIS — M1811 Unilateral primary osteoarthritis of first carpometacarpal joint, right hand: Secondary | ICD-10-CM | POA: Insufficient documentation

## 2017-04-07 HISTORY — PX: CARPOMETACARPAL (CMC) FUSION OF THUMB: SHX6290

## 2017-04-07 SURGERY — CARPOMETACARPAL (CMC) FUSION OF THUMB
Anesthesia: General | Site: Hand | Laterality: Right | Wound class: Clean

## 2017-04-07 MED ORDER — CHLORHEXIDINE GLUCONATE CLOTH 2 % EX PADS: 6.0000 | MEDICATED_PAD | Freq: Once | CUTANEOUS | Status: DC

## 2017-04-07 MED ORDER — PROPOFOL 10 MG/ML IV BOLUS
INTRAVENOUS | Status: DC | PRN
  Administered 2017-04-07: 200 mg via INTRAVENOUS

## 2017-04-07 MED ORDER — ONDANSETRON HCL 4 MG/2ML IJ SOLN
INTRAMUSCULAR | Status: DC | PRN
  Administered 2017-04-07: 4 mg via INTRAVENOUS

## 2017-04-07 MED ORDER — HYDROCODONE-ACETAMINOPHEN 5-325 MG PO TABS
1.0000 | ORAL_TABLET | Freq: Four times a day (QID) | ORAL | Status: DC | PRN
  Administered 2017-04-07: 1 via ORAL

## 2017-04-07 MED ORDER — NEOMYCIN-POLYMYXIN B GU 40-200000 IR SOLN
Status: AC
  Filled 2017-04-07: qty 2

## 2017-04-07 MED ORDER — CEFAZOLIN SODIUM-DEXTROSE 2-4 GM/100ML-% IV SOLN
INTRAVENOUS | Status: AC
  Filled 2017-04-07: qty 100

## 2017-04-07 MED ORDER — HYDROCODONE-ACETAMINOPHEN 5-325 MG PO TABS: 1.0000 | ORAL_TABLET | Freq: Four times a day (QID) | ORAL | 0 refills | Status: DC | PRN

## 2017-04-07 MED ORDER — BUPIVACAINE HCL (PF) 0.5 % IJ SOLN
INTRAMUSCULAR | Status: DC | PRN
  Administered 2017-04-07: 10 mL

## 2017-04-07 MED ORDER — FAMOTIDINE 20 MG PO TABS
ORAL_TABLET | ORAL | Status: AC
  Filled 2017-04-07: qty 1

## 2017-04-07 MED ORDER — GABAPENTIN 400 MG PO CAPS: 400.0000 mg | ORAL_CAPSULE | Freq: Two times a day (BID) | ORAL | 3 refills | Status: DC

## 2017-04-07 MED ORDER — MELOXICAM 15 MG PO TABS: 15.0000 mg | ORAL_TABLET | Freq: Every day | ORAL | 2 refills | Status: AC

## 2017-04-07 MED ORDER — DEXAMETHASONE SODIUM PHOSPHATE 10 MG/ML IJ SOLN
INTRAMUSCULAR | Status: DC | PRN
  Administered 2017-04-07: 6 mg via INTRAVENOUS

## 2017-04-07 MED ORDER — FENTANYL CITRATE (PF) 100 MCG/2ML IJ SOLN
25.0000 ug | INTRAMUSCULAR | Status: DC | PRN
  Administered 2017-04-07 (×2): 25 ug via INTRAVENOUS

## 2017-04-07 MED ORDER — FENTANYL CITRATE (PF) 100 MCG/2ML IJ SOLN
INTRAMUSCULAR | Status: AC
  Filled 2017-04-07: qty 2

## 2017-04-07 MED ORDER — BUPIVACAINE HCL (PF) 0.5 % IJ SOLN
INTRAMUSCULAR | Status: AC
  Filled 2017-04-07: qty 30

## 2017-04-07 MED ORDER — HYDROCODONE-ACETAMINOPHEN 5-325 MG PO TABS
ORAL_TABLET | ORAL | Status: AC
  Filled 2017-04-07: qty 1

## 2017-04-07 MED ORDER — MELOXICAM 7.5 MG PO TABS
15.0000 mg | ORAL_TABLET | ORAL | Status: AC
  Administered 2017-04-07: 15 mg via ORAL

## 2017-04-07 MED ORDER — MELOXICAM 7.5 MG PO TABS
ORAL_TABLET | ORAL | Status: AC
  Filled 2017-04-07: qty 2

## 2017-04-07 MED ORDER — GABAPENTIN 400 MG PO CAPS
ORAL_CAPSULE | ORAL | Status: AC
  Filled 2017-04-07: qty 1

## 2017-04-07 MED ORDER — PROPOFOL 10 MG/ML IV BOLUS
INTRAVENOUS | Status: AC
  Filled 2017-04-07: qty 20

## 2017-04-07 MED ORDER — LACTATED RINGERS IV SOLN
INTRAVENOUS | Status: DC
  Administered 2017-04-07: 11:00:00 via INTRAVENOUS

## 2017-04-07 MED ORDER — LIDOCAINE HCL (CARDIAC) 20 MG/ML IV SOLN
INTRAVENOUS | Status: DC | PRN
  Administered 2017-04-07: 80 mg via INTRAVENOUS

## 2017-04-07 MED ORDER — FENTANYL CITRATE (PF) 100 MCG/2ML IJ SOLN
INTRAMUSCULAR | Status: DC | PRN
  Administered 2017-04-07 (×2): 50 ug via INTRAVENOUS

## 2017-04-07 MED ORDER — MIDAZOLAM HCL 2 MG/2ML IJ SOLN
INTRAMUSCULAR | Status: AC
  Filled 2017-04-07: qty 2

## 2017-04-07 MED ORDER — MIDAZOLAM HCL 2 MG/2ML IJ SOLN
INTRAMUSCULAR | Status: DC | PRN
  Administered 2017-04-07: 2 mg via INTRAVENOUS

## 2017-04-07 MED ORDER — GABAPENTIN 400 MG PO CAPS
400.0000 mg | ORAL_CAPSULE | ORAL | Status: AC
  Administered 2017-04-07: 400 mg via ORAL

## 2017-04-07 MED ORDER — NEOMYCIN-POLYMYXIN B GU 40-200000 IR SOLN
Status: DC | PRN
  Administered 2017-04-07: 2 mL

## 2017-04-07 MED ORDER — CLINDAMYCIN PHOSPHATE 600 MG/50ML IV SOLN
INTRAVENOUS | Status: AC
  Filled 2017-04-07: qty 50

## 2017-04-07 MED ORDER — ONDANSETRON HCL 4 MG/2ML IJ SOLN: 4.0000 mg | Freq: Once | INTRAMUSCULAR | Status: DC | PRN

## 2017-04-07 MED ORDER — FAMOTIDINE 20 MG PO TABS
20.0000 mg | ORAL_TABLET | Freq: Once | ORAL | Status: AC
  Administered 2017-04-07: 20 mg via ORAL

## 2017-04-07 SURGICAL SUPPLY — 47 items
BLADE OSC/SAGITTAL MD 5.5X18 (BLADE) ×3 IMPLANT
BLADE SURG MINI STRL (BLADE) ×3 IMPLANT
BNDG ESMARK 4X12 TAN STRL LF (GAUZE/BANDAGES/DRESSINGS) ×3 IMPLANT
CANISTER SUCT 1200ML W/VALVE (MISCELLANEOUS) ×3 IMPLANT
CHLORAPREP W/TINT 26ML (MISCELLANEOUS) ×3 IMPLANT
CUFF TOURN 18 STER (MISCELLANEOUS) IMPLANT
DECANTER SPIKE VIAL GLASS SM (MISCELLANEOUS) ×3 IMPLANT
DRAPE FLUOR MINI C-ARM 54X84 (DRAPES) ×3 IMPLANT
DRAPE SHEET LG 3/4 BI-LAMINATE (DRAPES) ×3 IMPLANT
ELECT REM PT RETURN 9FT ADLT (ELECTROSURGICAL) ×3
ELECTRODE REM PT RTRN 9FT ADLT (ELECTROSURGICAL) ×1 IMPLANT
GAUZE FLUFF 18X24 1PLY STRL (GAUZE/BANDAGES/DRESSINGS) ×9 IMPLANT
GAUZE PETRO XEROFOAM 1X8 (MISCELLANEOUS) ×3 IMPLANT
GLOVE BIO SURGEON STRL SZ7.5 (GLOVE) ×3 IMPLANT
GLOVE BIO SURGEON STRL SZ8 (GLOVE) ×3 IMPLANT
GOWN STRL REUS W/ TWL LRG LVL3 (GOWN DISPOSABLE) ×1 IMPLANT
GOWN STRL REUS W/TWL LRG LVL3 (GOWN DISPOSABLE) ×3
GOWN STRL REUS W/TWL LRG LVL4 (GOWN DISPOSABLE) ×3 IMPLANT
KIT TURNOVER KIT A (KITS) ×3 IMPLANT
LOOP VESSEL MINI 0.8X406 BLUE (MISCELLANEOUS) ×1 IMPLANT
LOOPS BLUE MINI 0.8X406MM (MISCELLANEOUS) ×2
NDL FILTER BLUNT 18X1 1/2 (NEEDLE) ×1 IMPLANT
NEEDLE FILTER BLUNT 18X 1/2SAF (NEEDLE) ×2
NEEDLE FILTER BLUNT 18X1 1/2 (NEEDLE) ×1 IMPLANT
NS IRRIG 500ML POUR BTL (IV SOLUTION) ×3 IMPLANT
PACK EXTREMITY ARMC (MISCELLANEOUS) ×3 IMPLANT
PAD CAST CTTN 4X4 STRL (SOFTGOODS) ×1 IMPLANT
PADDING CAST COTTON 4X4 STRL (SOFTGOODS) ×3
PASSER SUT SWANSON 36MM LOOP (INSTRUMENTS) ×3 IMPLANT
SPLINT CAST 1 STEP 3X12 (MISCELLANEOUS) ×3 IMPLANT
SPONGE LAP 18X18 5 PK (GAUZE/BANDAGES/DRESSINGS) ×3 IMPLANT
STOCKINETTE 48X4 2 PLY STRL (GAUZE/BANDAGES/DRESSINGS) ×1 IMPLANT
STOCKINETTE BIAS CUT 4 980044 (GAUZE/BANDAGES/DRESSINGS) ×3 IMPLANT
STOCKINETTE STRL 4IN 9604848 (GAUZE/BANDAGES/DRESSINGS) ×3 IMPLANT
SUT ETHILON 4-0 (SUTURE) ×3
SUT ETHILON 4-0 FS2 18XMFL BLK (SUTURE) ×1
SUT ETHILON 5-0 FS-2 18 BLK (SUTURE) ×3 IMPLANT
SUT VIC AB 2-0 SH 27 (SUTURE) ×3
SUT VIC AB 2-0 SH 27XBRD (SUTURE) ×1 IMPLANT
SUT VIC AB 3-0 SH 27 (SUTURE) ×3
SUT VIC AB 3-0 SH 27X BRD (SUTURE) ×1 IMPLANT
SUT VIC AB 4-0 SH 27 (SUTURE) ×3
SUT VIC AB 4-0 SH 27XANBCTRL (SUTURE) ×1 IMPLANT
SUTURE ETHLN 4-0 FS2 18XMF BLK (SUTURE) ×1 IMPLANT
SYR 30ML LL (SYRINGE) ×3 IMPLANT
SYR 5ML LL (SYRINGE) ×3 IMPLANT
WIRE Z .062 C-WIRE SPADE TIP (WIRE) ×3 IMPLANT

## 2017-04-07 NOTE — H&P (Signed)
THE PATIENT WAS SEEN PRIOR TO SURGERY TODAY.  HISTORY, ALLERGIES, HOME MEDICATIONS AND OPERATIVE PROCEDURE WERE REVIEWED. RISKS AND BENEFITS OF SURGERY DISCUSSED WITH PATIENT AGAIN.  NO CHANGES FROM INITIAL HISTORY AND PHYSICAL NOTED.    

## 2017-04-07 NOTE — Transfer of Care (Signed)
Immediate Anesthesia Transfer of Care Note  Patient: Monica Lindsey  Procedure(s) Performed: CARPOMETACARPAL ([place]) FUSION OF THUMB (Right Hand)  Patient Location: PACU  Anesthesia Type:General  Level of Consciousness: alert  and oriented  Airway & Oxygen Therapy: Patient Spontanous Breathing  Post-op Assessment: Report given to RN and Post -op Vital signs reviewed and stable  Post vital signs: Reviewed and stable  Last Vitals:  Vitals:   04/07/17 1026  BP: 139/85  Pulse: 82  Resp: 16  Temp: 36.5 C  SpO2: 97%    Last Pain:  Vitals:   04/07/17 1026  TempSrc: Oral  PainSc: 4          Complications: No apparent anesthesia complications

## 2017-04-07 NOTE — Anesthesia Preprocedure Evaluation (Signed)
Anesthesia Evaluation  Patient identified by MRN, date of birth, ID band Patient awake    Reviewed: Allergy & Precautions, H&P , NPO status , Patient's Chart, lab work & pertinent test results, reviewed documented beta blocker date and time   Airway Mallampati: II  TM Distance: >3 FB Neck ROM: full    Dental  (+) Teeth Intact   Pulmonary neg pulmonary ROS,    Pulmonary exam normal        Cardiovascular Exercise Tolerance: Good hypertension, On Medications negative cardio ROS Normal cardiovascular exam Rate:Normal     Neuro/Psych  Headaches, negative neurological ROS  negative psych ROS   GI/Hepatic negative GI ROS, Neg liver ROS,   Endo/Other  negative endocrine ROS  Renal/GU negative Renal ROS  negative genitourinary   Musculoskeletal   Abdominal   Peds  Hematology negative hematology ROS (+)   Anesthesia Other Findings   Reproductive/Obstetrics negative OB ROS                             Anesthesia Physical Anesthesia Plan  ASA: II  Anesthesia Plan: General LMA   Post-op Pain Management:    Induction:   PONV Risk Score and Plan:   Airway Management Planned:   Additional Equipment:   Intra-op Plan:   Post-operative Plan:   Informed Consent: I have reviewed the patients History and Physical, chart, labs and discussed the procedure including the risks, benefits and alternatives for the proposed anesthesia with the patient or authorized representative who has indicated his/her understanding and acceptance.     Plan Discussed with: CRNA  Anesthesia Plan Comments:         Anesthesia Quick Evaluation

## 2017-04-07 NOTE — Anesthesia Procedure Notes (Signed)
Procedure Name: LMA Insertion Date/Time: 04/07/2017 11:15 AM Performed by: Philbert Riser, CRNA Pre-anesthesia Checklist: Patient identified, Emergency Drugs available, Suction available, Patient being monitored and Timeout performed Patient Re-evaluated:Patient Re-evaluated prior to induction Oxygen Delivery Method: Circle system utilized and Simple face mask Preoxygenation: Pre-oxygenation with 100% oxygen Induction Type: IV induction Ventilation: Mask ventilation without difficulty LMA Size: 4.0 Number of attempts: 1 Tube secured with: Tape Dental Injury: Teeth and Oropharynx as per pre-operative assessment

## 2017-04-07 NOTE — Anesthesia Postprocedure Evaluation (Signed)
Anesthesia Post Note  Patient: Monica Lindsey  Procedure(s) Performed: CARPOMETACARPAL ([place]) FUSION OF THUMB (Right Hand)  Patient location during evaluation: PACU Anesthesia Type: General Level of consciousness: awake and alert Pain management: pain level controlled Vital Signs Assessment: post-procedure vital signs reviewed and stable Respiratory status: spontaneous breathing, nonlabored ventilation, respiratory function stable and patient connected to nasal cannula oxygen Cardiovascular status: blood pressure returned to baseline and stable Postop Assessment: no apparent nausea or vomiting Anesthetic complications: no     Last Vitals:  Vitals:   04/07/17 1357 04/07/17 1414  BP: (!) 143/79 136/70  Pulse: 66 61  Resp: (!) 8 16  Temp:  36.5 C  SpO2: 98% 98%    Last Pain:  Vitals:   04/07/17 1414  TempSrc: Temporal  PainSc: [place] [name]

## 2017-04-07 NOTE — Discharge Instructions (Signed)

## 2017-04-07 NOTE — Anesthesia Post-op Follow-up Note (Signed)
Anesthesia QCDR form completed.        

## 2017-04-07 NOTE — Op Note (Signed)
04/07/2017  1:05 PM  PATIENT:  Monica Lindsey    PRE-OPERATIVE DIAGNOSIS:  M35.361 other specified arthritis right hand  POST-OPERATIVE DIAGNOSIS:  Same  PROCEDURE:  CARPOMETACARPAL ([facility]) FUSION OF THUMB  SURGEON:  Park Breed, MD  ANESTHESIA:   General  PREOPERATIVE INDICATIONS:  Monica Lindsey is a  72 y.o. female with a diagnosis of M13.841 other specified arthritis right hand who failed conservative measures and elected for surgical management.    The risks benefits and alternatives were discussed with the patient preoperatively including but not limited to the risks of infection, bleeding, nerve injury, cardiopulmonary complications, the need for revision surgery, among others, and the patient was willing to proceed.  EBL: none  TOURNIQUET TIME: 79 MIN  OPERATIVE IMPLANTS: None  OPERATIVE FINDINGS: Advanced arthritis right  thumb cmc joint  OPERATIVE PROCEDURE: The patient was brought to the operating room and underwent satisfactory general anesthesia in the supine position. The operative arm was prepped and draped in a sterile fashion. The patient was noted to have an excellent palmaris longus tendon. 3 short transverse incisions were made over the course of the tendon and it was harvested under direct vision. It was then placed in a moist sponge on the back table. An S-shaped incision was then made over the base of the thumb metacarpal dorsally. Dissection was carried out carefully under loupe magnification, carefully sparing the sensory nerves. The tendon sheath around the abductor pollicis longus and extensor pollicis brevis was released under direct vision, including over the radial styloid. Nerves were carefully spared. Retractors were inserted and the capsule of the trapezium and metacarpal were opened longitudinally. The radial artery and veins were carefully dissected free and retracted with a vessel loop drain. The capsule was dissected off the trapezium  and the trapezium was then morselized with the oscillating saw and rongeur. The flexor carpi radialis tendon was freed up in the base of the wound. The palmaris longus tendon was passed beneath this. A 3.5 mm drill was used to create a tunnel through the base of the metacarpal. The tendon was then passed up through the metacarpal using a tendon passer. The thumb had been placed in traction which was then released. The tendon was tied upon itself and the knot was sutured to itself prevent slippage. It was then tied in multiple knots and sutured into a ball. This was then rotated into the space between the metacarpal and the scaphoid. The capsule was then carefully and thoroughly, closed with 2-0 Vicryl suture. After irrigation, the skin was closed with 5-0 nylon. The forearm wounds had been closed with a similar suture. A well-padded thumb spica splint was applied. Tourniquet was deflated with good return of blood flow to the hand. Sponge and needle counts were correct. Patient was taken to recovery in good condition.  Park Breed, MD

## 2017-04-09 DIAGNOSIS — M13842 Other specified arthritis, left hand: Secondary | ICD-10-CM | POA: Diagnosis not present

## 2017-04-09 DIAGNOSIS — M13841 Other specified arthritis, right hand: Secondary | ICD-10-CM | POA: Diagnosis not present

## 2017-04-13 DIAGNOSIS — Z4889 Encounter for other specified surgical aftercare: Secondary | ICD-10-CM | POA: Diagnosis not present

## 2017-04-13 DIAGNOSIS — M13841 Other specified arthritis, right hand: Secondary | ICD-10-CM | POA: Diagnosis not present

## 2017-04-26 DIAGNOSIS — M13841 Other specified arthritis, right hand: Secondary | ICD-10-CM | POA: Diagnosis not present

## 2017-05-25 DIAGNOSIS — M180 Bilateral primary osteoarthritis of first carpometacarpal joints: Secondary | ICD-10-CM | POA: Diagnosis not present

## 2017-06-10 DIAGNOSIS — M180 Bilateral primary osteoarthritis of first carpometacarpal joints: Secondary | ICD-10-CM | POA: Diagnosis not present

## 2017-06-18 DIAGNOSIS — S46012A Strain of muscle(s) and tendon(s) of the rotator cuff of left shoulder, initial encounter: Secondary | ICD-10-CM | POA: Diagnosis not present

## 2017-07-07 NOTE — Progress Notes (Deleted)
Subjective:   Monica Lindsey is a 72 y.o. female who presents for Medicare Annual (Subsequent) preventive examination.  Review of Systems: No ROS.  Medicare Wellness Visit. Additional risk factors are reflected in the social history.    Sleep patterns:    Home Safety/Smoke Alarms: Feels safe in home. Smoke alarms in place.  Living environment; residence and Firearm Safety: .   Female:       Mammo-utd       Dexa scan-        CCS- 2011    Objective:     Vitals: There were no vitals taken for this visit.  There is no height or weight on file to calculate BMI.  Advanced Directives 03/31/2017 07/09/2016 05/23/2013 05/23/2013  Does Patient Have a Medical Advance Directive? Yes Yes Patient has advance directive, copy not in chart Patient does not have advance directive  Type of Advance Directive [place];Living will [place];Living will [place];Living will -  Does patient want to make changes to medical advance directive? - - No change requested -  Copy of [place] in Chart? - No - copy requested - -  Pre-existing out of facility DNR order (yellow form or pink MOST form) - - No No    Tobacco Social History   Tobacco Use  Smoking Status Never Smoker  Smokeless Tobacco Never Used     Counseling given: Not Answered   Clinical Intake:                       Past Medical History:  Diagnosis Date  . Arthritis   . Constipation   . Frequent headaches   . Hay fever   . Hyperlipidemia   . Hypertension    Past Surgical History:  Procedure Laterality Date  . ABDOMINAL HYSTERECTOMY    . BACK SURGERY  1983  . CARPOMETACARPAL ([place]) FUSION OF THUMB Right 04/07/2017   Procedure: CARPOMETACARPAL [facility]) FUSION OF THUMB;  Surgeon: Earnestine Leys, MD;  Location: [facility] ORS;  Service: Orthopedics;  Laterality: Right;  . HEMORRHOID SURGERY    . TOOTH EXTRACTION     Family History  Problem Relation  Age of Onset  . Heart disease Mother   . Alcohol abuse Father   . Arthritis Father   . Heart disease Father   . Stroke Father   . Hypertension Father   . Breast cancer Paternal Aunt 62   Social History   Socioeconomic History  . Marital status: Widowed    Spouse name: Not on file  . Number of children: Not on file  . Years of education: Not on file  . Highest education level: Not on file  Occupational History  . Not on file  Social Needs  . Financial resource strain: Not on file  . Food insecurity:    Worry: Not on file    Inability: Not on file  . Transportation needs:    Medical: Not on file    Non-medical: Not on file  Tobacco Use  . Smoking status: Never Smoker  . Smokeless tobacco: Never Used  Substance and Sexual Activity  . Alcohol use: Yes    Comment: rare  . Drug use: No  . Sexual activity: Never  Lifestyle  . Physical activity:    Days per week: Not on file    Minutes per session: Not on file  . Stress: Not on file  Relationships  .  Social connections:    Talks on phone: Not on file    Gets together: Not on file    Attends religious service: Not on file    Active member of club or organization: Not on file    Attends meetings of clubs or organizations: Not on file    Relationship status: Not on file  Other Topics Concern  . 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   Widowed.      Still working 3 days per week.  Very close to her children and grand children.      Daughter, [name], is HPOA.  Has a living will.   Would desire CPR, would not desire prolonged life support if futile.             Outpatient Encounter Medications as of 07/14/2017  Medication Sig  . amLODipine (NORVASC) 5 MG tablet TAKE 1 TABLET EVERY DAY (Patient taking differently: Take 5 mg by mouth at bedtime)  . aspirin EC 81 MG tablet Take 81 mg by mouth daily.  . Cyanocobalamin (VITAMIN B-12 PO) Place 1 tablet under the  tongue daily.   Marland Kitchen gabapentin (NEURONTIN) 400 MG capsule Take 1 capsule (400 mg total) by mouth 2 (two) times daily.  . hydrochlorothiazide (HYDRODIURIL) 25 MG tablet TAKE 1 TABLET EVERY DAY (Patient taking differently: Take 25 mg by mouth daily)  . HYDROcodone-acetaminophen (NORCO) 5-325 MG tablet Take 1 tablet by mouth every 6 (six) hours as needed.  . meloxicam (MOBIC) 15 MG tablet Take 1 tablet (15 mg total) by mouth daily.  . Multiple Vitamin (MULTIVITAMIN) tablet Take 1 tablet by mouth 2 (two) times daily.  . Omega-3 Fatty Acids (FISH OIL) 1000 MG CAPS Take 1,000 mg by mouth 2 (two) times daily.   . pravastatin (PRAVACHOL) 40 MG tablet TAKE 1 TABLET EVERY DAY (Patient taking differently: Take 40 mg by mouth at bedtime)   No facility-administered encounter medications on file as of 07/14/2017.     Activities of Daily Living In your present state of health, do you have any difficulty performing the following activities: 03/31/2017 07/09/2016  Hearing? N N  Vision? N N  Difficulty concentrating or making decisions? N N  Walking or climbing stairs? N N  Dressing or bathing? N N  Doing errands, shopping? - Scientist, forensic and eating ? - N  Using the Toilet? - N  In the past six months, have you accidently leaked urine? - N  Do you have problems with loss of bowel control? - N  Managing your Medications? - N  Managing your Finances? - N  Housekeeping or managing your Housekeeping? - N  Some recent data might be hidden    Patient Care Team: Lucille Passy, MD as PCP - General (Family Medicine) Oh, Lupita Dawn, MD (Inactive) as Physician Assistant (Internal Medicine) Barrie Dunker, MD as Counselor (Dermatology)    Assessment:   This is a routine wellness examination for Monica Lindsey. Physical assessment deferred to PCP.  Exercise Activities and Dietary recommendations   Diet (meal preparation, eat out, water intake, caffeinated beverages, dairy products, fruits and vegetables): {Desc;  diets:[id number]} Breakfast: Lunch:  Dinner:      Goals    None      Fall Risk Fall Risk  07/09/2016 06/18/2015 06/11/2014 05/17/2013  Falls in the past year? Yes Yes Yes Yes  Comment pt had a fall in Nov and in Dec  2017; injury but no medical treatment - - -  Number falls in past yr: 2 or more 1 2 or more 2 or more  Comment - - - stepped on a hickory nut  Injury with Fall? No - No -    Depression Screen PHQ 2/9 Scores 07/09/2016 06/18/2015 06/11/2014 05/17/2013  PHQ - 2 Score 0 0 1 0     Cognitive Function MMSE - Mini Mental State Exam 07/09/2016  Orientation to time 5  Orientation to Place 5  Registration 3  Attention/ Calculation 0  Recall 3  Language- name 2 objects 0  Language- repeat 1  Language- follow 3 step command 3  Language- read & follow direction 0  Write a sentence 0  Copy design 0  Total score 20        Immunization History  Administered Date(s) Administered  . DTaP 10/27/2007  . Influenza,inj,Quad PF,6+ Mos 12/06/2013, 10/10/2014, 11/27/2015, 12/01/2016  . Pneumococcal Conjugate-13 05/17/2013  . Pneumococcal-Unspecified 03/06/2009  . Zoster 10/27/2007    Screening Tests Health Maintenance  Topic Date Due  . DEXA SCAN  07/09/2017 (Originally 11/22/2008)  . TETANUS/TDAP  07/10/2026 (Originally [birth year]/1962)  . INFLUENZA VACCINE  09/02/2017  . MAMMOGRAM  10/14/2018  . COLONOSCOPY  07/11/2019  . Hepatitis C Screening  Completed  . PNA vac Low Risk Adult  Completed       Plan:   ***   I have personally reviewed and noted the following in the patient's chart:   . Medical and social history . Use of alcohol, tobacco or illicit drugs  . Current medications and supplements . Functional ability and status . Nutritional status . Physical activity . Advanced directives . List of other physicians . Hospitalizations, surgeries, and ER visits in previous 12 months . Vitals . Screenings to include cognitive, depression, and falls . Referrals and  appointments  In addition, I have reviewed and discussed with patient certain preventive protocols, quality metrics, and best practice recommendations. A written personalized care plan for preventive services as well as general preventive health recommendations were provided to patient.     Shela Nevin, [place]  07/07/2017

## 2017-07-14 ENCOUNTER — Ambulatory Visit: Payer: Medicare HMO | Admitting: Behavioral Health

## 2017-07-14 ENCOUNTER — Ambulatory Visit: Payer: Medicare HMO

## 2017-07-19 ENCOUNTER — Ambulatory Visit (INDEPENDENT_AMBULATORY_CARE_PROVIDER_SITE_OTHER): Payer: Medicare HMO | Admitting: Family Medicine

## 2017-07-19 VITALS — BP 120/78 | HR 77 | Temp 98.1°F | Ht 64.17 in | Wt 179.0 lb

## 2017-07-19 DIAGNOSIS — Z780 Asymptomatic menopausal state: Secondary | ICD-10-CM

## 2017-07-19 DIAGNOSIS — Z01419 Encounter for gynecological examination (general) (routine) without abnormal findings: Secondary | ICD-10-CM | POA: Diagnosis not present

## 2017-07-19 DIAGNOSIS — E785 Hyperlipidemia, unspecified: Secondary | ICD-10-CM | POA: Diagnosis not present

## 2017-07-19 DIAGNOSIS — I10 Essential (primary) hypertension: Secondary | ICD-10-CM

## 2017-07-19 LAB — COMPREHENSIVE METABOLIC PANEL
ALK PHOS: 117 U/L (ref 39–117)
ALT: 29 U/L (ref 0–35)
AST: 21 U/L (ref 0–37)
Albumin: 4.2 g/dL (ref 3.5–5.2)
BUN: 24 mg/dL — ABNORMAL HIGH (ref 6–23)
CO2: 27 meq/L (ref 19–32)
Calcium: 9.9 mg/dL (ref 8.4–10.5)
Chloride: 103 mEq/L (ref 96–112)
Creatinine, Ser: 0.9 mg/dL (ref 0.40–1.20)
GFR: 65.11 mL/min (ref >60.00)
GLUCOSE: 97 mg/dL (ref 70–99)
POTASSIUM: 4.1 meq/L (ref 3.5–5.1)
Sodium: 139 mEq/L (ref 135–145)
Total Bilirubin: 0.5 mg/dL (ref 0.2–1.2)
Total Protein: 7.2 g/dL (ref 6.0–8.3)

## 2017-07-19 LAB — TSH: TSH: 1.75 u[IU]/mL (ref 0.35–4.50)

## 2017-07-19 LAB — LIPID PANEL
CHOL/HDL RATIO: 3
Cholesterol: 203 mg/dL — ABNORMAL HIGH (ref 0–200)
HDL: 61.3 mg/dL (ref >39.00)
LDL Cholesterol: 119 mg/dL — ABNORMAL HIGH (ref 0–99)
NONHDL: 141.79
Triglycerides: 115 mg/dL (ref 0.0–149.0)
VLDL: 23 mg/dL (ref 0.0–40.0)

## 2017-07-19 NOTE — Patient Instructions (Addendum)
Great to see you. I will call you with your lab results from today and you can view them online.   Please schedule an annual wellness visit with our wellness nurse on your way out.  When you call [facility] to schedule your mammogram in September, schedule a bone density.

## 2017-07-19 NOTE — Assessment & Plan Note (Signed)
Reviewed preventive care protocols, scheduled due services, and updated immunizations Discussed nutrition, exercise, diet, and healthy lifestyle.  Orders Placed This Encounter  Procedures  . Comprehensive metabolic panel  . Lipid panel  . TSH    

## 2017-07-19 NOTE — Assessment & Plan Note (Signed)
Check labs today. The patient indicates understanding of these issues and agrees with the plan.  

## 2017-07-19 NOTE — Assessment & Plan Note (Signed)
Well controlled. No changes made. Labs today.

## 2017-07-19 NOTE — Progress Notes (Signed)
Subjective:   Patient ID: Monica Lindsey, female    DOB: [birth year], 72 y.o.   MRN: [id number]  Monica Lindsey is a pleasant 73 y.o. year old female who presents to clinic today with Annual Exam (pt. here today for annual physical; no c/c at this time)  and follow up of chronic medical conditions on 07/19/2017  HPI:  Health Maintenance  Topic Date Due  . DEXA SCAN  11/22/2008  . TETANUS/TDAP  07/10/2026 (Originally 10/26/2017)  . INFLUENZA VACCINE  09/02/2017  . MAMMOGRAM  10/14/2018  . COLONOSCOPY  07/11/2019  . Hepatitis C Screening  Completed  . PNA vac Low Risk Adult  Completed      HTN- on HCTZ 25 mg and amlodipine 5 mg daily- not taking any potassium supplementation. Eats a lot of bananas. Lab Results  Component Value Date   NA 141 04/01/2017   K 4.2 04/01/2017   CL 104 04/01/2017   CO2 30 04/01/2017   Lab Results  Component Value Date   CREATININE 0.83 04/01/2017     HLD- on pravastatin 40 mg daily.  Denies myalgias. Lab Results  Component Value Date   CHOL 197 07/09/2016   HDL 56.50 07/09/2016   LDLCALC 114 (H) 07/09/2016   LDLDIRECT 127.3 03/23/2013   TRIG 131.0 07/09/2016   CHOLHDL 3 07/09/2016     Lab Results  Component Value Date   CREATININE 0.83 04/01/2017   Lab Results  Component Value Date   ALT 14 04/01/2017   AST 17 04/01/2017   ALKPHOS 61 04/01/2017   BILITOT 0.6 04/01/2017   Lab Results  Component Value Date   NA 141 04/01/2017   K 4.2 04/01/2017   CL 104 04/01/2017   CO2 30 04/01/2017        Patient Active Problem List   Diagnosis Date Noted  . Well woman exam 07/19/2017  . Obesity, unspecified 05/17/2013  . HTN (hypertension) 03/23/2013  . HLD (hyperlipidemia) 03/23/2013  . OA (osteoarthritis) 03/23/2013   Past Medical History:  Diagnosis Date  . Arthritis   . Constipation   . Frequent headaches   . Hay fever   . Hyperlipidemia   . Hypertension    Past Surgical History:  Procedure Laterality Date  .  ABDOMINAL HYSTERECTOMY    . BACK SURGERY  1983  . CARPOMETACARPAL ([place]) FUSION OF THUMB Right 04/07/2017   Procedure: CARPOMETACARPAL [facility]) FUSION OF THUMB;  Surgeon: Earnestine Leys, MD;  Location: [facility] ORS;  Service: Orthopedics;  Laterality: Right;  . HEMORRHOID SURGERY    . TOOTH EXTRACTION     Social History   Tobacco Use  . Smoking status: Never Smoker  . Smokeless tobacco: Never Used  Substance Use Topics  . Alcohol use: Yes    Comment: rare  . Drug use: No   Family History  Problem Relation Age of Onset  . Heart disease Mother   . Alcohol abuse Father   . Arthritis Father   . Heart disease Father   . Stroke Father   . Hypertension Father   . Breast cancer Paternal Aunt 60   Allergies  Allergen Reactions  . Demerol [Meperidine] Other (See Comments)    Makes her "looney"   Current Outpatient Medications on File Prior to Visit  Medication Sig Dispense Refill  . amLODipine (NORVASC) 5 MG tablet TAKE 1 TABLET EVERY DAY (Patient taking differently: Take 5 mg by mouth at bedtime) 90 tablet 2  . aspirin EC 81 MG tablet Take 81  mg by mouth daily.    . Cyanocobalamin (VITAMIN B-12 PO) Place 1 tablet under the tongue daily.     . Flaxseed Oil (LINSEED OIL) OIL by Does not apply route.    . gabapentin (NEURONTIN) 400 MG capsule Take 1 capsule (400 mg total) by mouth 2 (two) times daily. 60 capsule 3  . hydrochlorothiazide (HYDRODIURIL) 25 MG tablet TAKE 1 TABLET EVERY DAY (Patient taking differently: Take 25 mg by mouth daily) 90 tablet 2  . HYDROcodone-acetaminophen (NORCO) 5-325 MG tablet Take 1 tablet by mouth every 6 (six) hours as needed. 40 tablet 0  . meloxicam (MOBIC) 15 MG tablet Take 1 tablet (15 mg total) by mouth daily. 30 tablet 2  . Multiple Vitamin (MULTIVITAMIN) tablet Take 1 tablet by mouth 2 (two) times daily.    . Omega-3 Fatty Acids (FISH OIL) 1000 MG CAPS Take 1,000 mg by mouth 2 (two) times daily.     . pravastatin (PRAVACHOL) 40 MG tablet TAKE 1 TABLET  EVERY DAY (Patient taking differently: Take 40 mg by mouth at bedtime) 90 tablet 2   No current facility-administered medications on file prior to visit.    The PMH, PSH, Social History, Family History, Medications, and allergies have been reviewed in [facility], and have been updated if relevant.    Review of Systems  Constitutional: Negative.   HENT: Negative.   Eyes: Negative.   Respiratory: Negative.   Cardiovascular: Negative.   Gastrointestinal: Negative.   Endocrine: Negative.   Genitourinary: Negative.   Musculoskeletal: Negative for arthralgias and myalgias.  Allergic/Immunologic: Negative.   Neurological: Negative.   Hematological: Negative.   Psychiatric/Behavioral: Negative.   All other systems reviewed and are negative.     Objective:    BP 120/78 (BP Location: Left Arm, Cuff Size: Normal)   Pulse 77   Temp 98.1 F (36.7 C) (Oral)   Ht 5' 4.17" (1.63 m)   Wt 179 lb (81.2 kg)   SpO2 97%   BMI 30.56 kg/m   Wt Readings from Last 3 Encounters:  07/19/17 179 lb (81.2 kg)  04/07/17 188 lb (85.3 kg)  04/01/17 188 lb (85.3 kg)    Physical Exam   General:  Well-developed,well-nourished,in no acute distress; alert,appropriate and cooperative throughout examination Head:  normocephalic and atraumatic.   Eyes:  vision grossly intact, PERRL Ears:  R ear normal and L ear normal externally, TMs clear bilaterally Nose:  no external deformity.   Mouth:  good dentition.   Neck:  No deformities, masses, or tenderness noted. Breasts:  No mass, nodules, thickening, tenderness, bulging, retraction, inflamation, nipple discharge or skin changes noted.   Lungs:  Normal respiratory effort, chest expands symmetrically. Lungs are clear to auscultation, no crackles or wheezes. Heart:  Normal rate and regular rhythm. S1 and S2 normal without gallop, murmur, click, rub or other extra sounds. Abdomen:  Bowel sounds positive,abdomen soft and non-tender without masses, organomegaly or  hernias noted. Msk:  No deformity or scoliosis noted of thoracic or lumbar spine.   Extremities:  No clubbing, cyanosis, edema, or deformity noted with normal full range of motion of all joints.   Neurologic:  alert & oriented X3 and gait normal.   Skin:  Intact without suspicious lesions or rashes Cervical Nodes:  No lymphadenopathy noted Axillary Nodes:  No palpable lymphadenopathy Psych:  Cognition and judgment appear intact. Alert and cooperative with normal attention span and concentration. No apparent delusions, illusions, hallucinations       Assessment & Plan:  Well woman exam  Essential hypertension  Hyperlipidemia, unspecified hyperlipidemia type - Plan: Comprehensive metabolic panel, Lipid panel, TSH No follow-ups on file.

## 2017-08-06 ENCOUNTER — Telehealth: Payer: Self-pay | Admitting: Family Medicine

## 2017-08-06 DIAGNOSIS — E2839 Other primary ovarian failure: Secondary | ICD-10-CM

## 2017-08-06 NOTE — Telephone Encounter (Signed)
Sorry/the place in [place] is [facility]/thx dmf

## 2017-08-06 NOTE — Telephone Encounter (Signed)
TA-Pt would like to have BMD done in [place] where she gets her Mammograms done/plz advise/thx dmf

## 2017-08-06 NOTE — Telephone Encounter (Signed)
Copied from [name] [id number]. Topic: Quick Communication - See Telephone Encounter >> Aug 06, 2017  3:38 PM Burchel, [name] wrote: CRM for notification. See Telephone encounter for: 08/06/17.  Pt wants to have bone density testing done in [place] if possible where she had her Mammogram done. Please let pt know if this is possible.    Pt: [phone]

## 2017-08-07 NOTE — Telephone Encounter (Signed)
I just tried to change order but it won't let me because it seems it's already been scheduled?  Can you please call [place] to verify this?  Thank you.

## 2017-08-10 NOTE — Telephone Encounter (Signed)
Order placed

## 2017-08-10 NOTE — Telephone Encounter (Signed)
TA-I will call pt to cancel that appt/are you able to place a new order for BMD scan at [facility]?

## 2017-08-11 NOTE — Telephone Encounter (Signed)
Pt aware/thx dmf 

## 2017-08-17 ENCOUNTER — Inpatient Hospital Stay: Admission: RE | Admit: 2017-08-17 | Payer: Medicare HMO | Source: Ambulatory Visit

## 2017-09-22 ENCOUNTER — Encounter: Payer: Self-pay | Admitting: *Deleted

## 2017-09-22 ENCOUNTER — Ambulatory Visit (INDEPENDENT_AMBULATORY_CARE_PROVIDER_SITE_OTHER): Payer: Medicare HMO | Admitting: Family Medicine

## 2017-09-22 ENCOUNTER — Encounter: Payer: Self-pay | Admitting: Family Medicine

## 2017-09-22 VITALS — BP 140/80 | HR 68 | Temp 98.4°F | Ht 64.17 in | Wt 183.2 lb

## 2017-09-22 DIAGNOSIS — M25512 Pain in left shoulder: Secondary | ICD-10-CM

## 2017-09-22 DIAGNOSIS — S46012A Strain of muscle(s) and tendon(s) of the rotator cuff of left shoulder, initial encounter: Secondary | ICD-10-CM

## 2017-09-22 MED ORDER — METHYLPREDNISOLONE ACETATE 40 MG/ML IJ SUSP
80.0000 mg | Freq: Once | INTRAMUSCULAR | Status: AC
  Administered 2017-09-22: 80 mg via INTRA_ARTICULAR

## 2017-09-22 NOTE — Patient Instructions (Signed)
REFERRALS TO SPECIALISTS, SPECIAL TESTS (MRI, CT, ULTRASOUNDS)  MARION or  Anastasiya will help you. ASK CHECK-IN FOR HELP.  Specialist appointment times vary a great deal, based on their schedule / openings. -- Some specialists have very long wait times. (Example. Dermatology)    

## 2017-09-22 NOTE — Progress Notes (Signed)
Dr. Frederico Hamman T. Kaytlen Lightsey, MD, [facility] [place], [address] Phone: [phone] Fax: [phone]  09/22/2017  Patient: Monica Lindsey, MRN: [id number], DOB: [birth year], 72 y.o.  Primary Physician:  Lucille Passy, MD   Chief Complaint  Patient presents with  . Shoulder Pain    Left   Subjective:   Monica Lindsey is a 72 y.o. very pleasant female patient who presents with the following:  L shoulder pain: Lifted a bag in her daughter's SUV. Saw Dr. Sabra Heck in May and he did an  X-ray and was OK. Reaching up in a cabinet will and heard a pop. Not much OA on L shoulder.   L RTC partial tear several months ago.  Initially, she had quite a bit more pain, but she still is having pain and a painful arc of motion and she is having pain at nighttime when she rolls on her shoulder.  She is having pain in certain movements of her shoulder when it catches when she loads a certain aspect of her shoulder particularly in abduction and occasionally with internal range of motion.  Her strength is preserved.  She is able to do all of her activities of daily living.  l subac inj  Past Medical History, Surgical History, Social History, Family History, Problem List, Medications, and Allergies have been reviewed and updated if relevant.  Patient Active Problem List   Diagnosis Date Noted  . Well woman exam 07/19/2017  . Obesity, unspecified 05/17/2013  . HTN (hypertension) 03/23/2013  . HLD (hyperlipidemia) 03/23/2013  . OA (osteoarthritis) 03/23/2013    Past Medical History:  Diagnosis Date  . Arthritis   . Constipation   . Frequent headaches   . Hay fever   . Hyperlipidemia   . Hypertension     Past Surgical History:  Procedure Laterality Date  . ABDOMINAL HYSTERECTOMY    . BACK SURGERY  1983  . CARPOMETACARPAL ([place]) FUSION OF THUMB Right 04/07/2017   Procedure: CARPOMETACARPAL [facility]) FUSION OF THUMB;  Surgeon: Earnestine Leys,  MD;  Location: [facility] ORS;  Service: Orthopedics;  Laterality: Right;  . HEMORRHOID SURGERY    . TOOTH EXTRACTION      Social History   Socioeconomic History  . Marital status: Widowed    Spouse name: Not on file  . Number of children: Not on file  . Years of education: Not on file  . Highest education level: Not on file  Occupational History  . Not on file  Social Needs  . Financial resource strain: Not on file  . Food insecurity:    Worry: Not on file    Inability: Not on file  . Transportation needs:    Medical: Not on file    Non-medical: Not on file  Tobacco Use  . Smoking status: Never Smoker  . Smokeless tobacco: Never Used  Substance and Sexual Activity  . Alcohol use: Yes    Comment: rare  . Drug use: No  . Sexual activity: Never  Lifestyle  . Physical activity:    Days per week: Not on file    Minutes per session: Not on file  . Stress: Not on file  Relationships  . Social connections:    Talks on phone: Not on file    Gets together: Not on file    Attends religious service: Not on file    Active member of club or organization: Not on file  Attends meetings of clubs or organizations: Not on file    Relationship status: Not on file  . Intimate partner violence:    Fear of current or ex partner: Not on file    Emotionally abused: Not on file    Physically abused: Not on file    Forced sexual activity: Not on file  Other Topics Concern  . 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   Widowed.      Still working 3 days per week.  Very close to her children and grand children.      Daughter, [name], is HPOA.  Has a living will.   Would desire CPR, would not desire prolonged life support if futile.             Family History  Problem Relation Age of Onset  . Heart disease Mother   . Alcohol abuse Father   . Arthritis Father   . Heart disease Father   . Stroke Father   . Hypertension Father    . Breast cancer Paternal Aunt 60    Allergies  Allergen Reactions  . Demerol [Meperidine] Other (See Comments)    Makes her "looney"    Medication list reviewed and updated in full in [place].  GEN: No fevers, chills. Nontoxic. Primarily MSK c/o today. MSK: Detailed in the HPI GI: tolerating PO intake without difficulty Neuro: No numbness, parasthesias, or tingling associated. Otherwise the pertinent positives of the ROS are noted above.   Objective:   BP 140/80   Pulse 68   Temp 98.4 F (36.9 C) (Oral)   Ht 5' 4.17" (1.63 m)   Wt 183 lb 4 oz (83.1 kg)   BMI 31.29 kg/m    GEN: Well-developed,well-nourished,in no acute distress; alert,appropriate and cooperative throughout examination HEENT: Normocephalic and atraumatic without obvious abnormalities. Ears, externally no deformities PULM: Breathing comfortably in no respiratory distress EXT: No clubbing, cyanosis, or edema PSYCH: Normally interactive. Cooperative during the interview. Pleasant. Friendly and conversant. Not anxious or depressed appearing. Normal, full affect.  Shoulder: L Inspection: No muscle wasting or winging Ecchymosis/edema: neg  AC joint, scapula, clavicle: NT Cervical spine: NT, full ROM Spurling's: neg Abduction: full, 5/5, mild painful arc of motion Flexion: full, 5/5 IR, full, lift-off: 5/5 ER at neutral: full, 5/5 AC crossover: neg Neer: mild Hawkins: mild Drop Test: neg Empty Can: mild Supraspinatus insertion: mild-mod T Bicipital groove: NT Speed's: neg Yergason's: neg Sulcus sign: neg Scapular dyskinesis: none C5-T1 intact  Neuro: Sensation intact Grip 5/5   Radiology: No results found.  Assessment and Plan:   Rotator cuff strain, left, initial encounter - Plan: Ambulatory referral to Physical Therapy, methylPREDNISolone acetate (DEPO-MEDROL) injection 80 mg  My suspicion is that she had a partial-thickness rotator cuff tear originally. Strength is intact.   This has improved per report, and her x-rays are also normal without significant arthritis per report.  They are not available for my independent review.  I am good to have her start doing some physical therapy, and she is well familiar with this having had some significant rotator cuff tendinitis a few years ago on the R.  We are going to have her do a refresher course again and work on home rehab.  Subacromial injection today to try to calm her shoulder down now.  SubAC Injection, L Verbal consent was obtained from the patient. Risks (including rare infection), benefits, and alternatives were  explained. Patient prepped with Chloraprep and Ethyl Chloride used for anesthesia. The subacromial space was injected using the posterior approach. The patient tolerated the procedure well and had decreased pain post injection. No complications. Injection: 8 cc of Lidocaine 1% and 2 mL of Depo-Medrol 40 mg. Needle: 22 gauge   Follow-up: Return in about 6 weeks (around 11/03/2017).  Meds ordered this encounter  Medications  . methylPREDNISolone acetate (DEPO-MEDROL) injection 80 mg   Orders Placed This Encounter  Procedures  . Ambulatory referral to Physical Therapy    Signed,  Frederico Hamman T. Marah Park, MD   Allergies as of 09/22/2017      Reactions   Demerol [meperidine] Other (See Comments)   Makes her "looney"      Medication List        Accurate as of 09/22/17 11:59 PM. Always use your most recent med list.          amLODipine 5 MG tablet Commonly known as:  NORVASC TAKE 1 TABLET EVERY DAY   aspirin EC 81 MG tablet Take 81 mg by mouth daily.   Fish Oil 1000 MG Caps Take 1,000 mg by mouth 2 (two) times daily.   gabapentin 400 MG capsule Commonly known as:  NEURONTIN Take 1 capsule (400 mg total) by mouth 2 (two) times daily.   hydrochlorothiazide 25 MG tablet Commonly known as:  HYDRODIURIL TAKE 1 TABLET EVERY DAY   HYDROcodone-acetaminophen 5-325 MG tablet Commonly known  as:  NORCO/VICODIN Take 1 tablet by mouth every 6 (six) hours as needed.   Linseed Oil Oil by Does not apply route.   meloxicam 15 MG tablet Commonly known as:  MOBIC Take 1 tablet (15 mg total) by mouth daily.   multivitamin tablet Take 1 tablet by mouth 2 (two) times daily.   pravastatin 40 MG tablet Commonly known as:  PRAVACHOL TAKE 1 TABLET EVERY DAY   VITAMIN B-12 PO Place 1 tablet under the tongue daily.

## 2017-09-28 DIAGNOSIS — S46012D Strain of muscle(s) and tendon(s) of the rotator cuff of left shoulder, subsequent encounter: Secondary | ICD-10-CM | POA: Diagnosis not present

## 2017-09-28 DIAGNOSIS — M25512 Pain in left shoulder: Secondary | ICD-10-CM | POA: Diagnosis not present

## 2017-09-28 DIAGNOSIS — M7542 Impingement syndrome of left shoulder: Secondary | ICD-10-CM | POA: Diagnosis not present

## 2017-10-05 DIAGNOSIS — M7542 Impingement syndrome of left shoulder: Secondary | ICD-10-CM | POA: Diagnosis not present

## 2017-10-05 DIAGNOSIS — S46012D Strain of muscle(s) and tendon(s) of the rotator cuff of left shoulder, subsequent encounter: Secondary | ICD-10-CM | POA: Diagnosis not present

## 2017-10-05 DIAGNOSIS — M25512 Pain in left shoulder: Secondary | ICD-10-CM | POA: Diagnosis not present

## 2017-10-07 ENCOUNTER — Telehealth: Payer: Self-pay | Admitting: Family Medicine

## 2017-10-07 ENCOUNTER — Other Ambulatory Visit: Payer: Self-pay | Admitting: Family Medicine

## 2017-10-07 DIAGNOSIS — Z1211 Encounter for screening for malignant neoplasm of colon: Secondary | ICD-10-CM

## 2017-10-07 DIAGNOSIS — Z1231 Encounter for screening mammogram for malignant neoplasm of breast: Secondary | ICD-10-CM

## 2017-10-07 NOTE — Telephone Encounter (Signed)
Copied from [place] [id number]. Topic: Quick Communication - See Telephone Encounter >> Oct 07, 2017  3:45 PM Gardiner Ramus wrote: CRM for notification. See Telephone encounter for: 10/07/17. Pt called and stated that she needs a referral to Gastroenterology for a colonoscopy. Pt states this was discussed in last visit. Please advise. [phone]

## 2017-10-08 NOTE — Telephone Encounter (Signed)
Referral placed.

## 2017-10-08 NOTE — Telephone Encounter (Signed)
Called pt but no VM available. Will try again later.

## 2017-10-08 NOTE — Telephone Encounter (Signed)
Pt needs a referral

## 2017-10-13 NOTE — Telephone Encounter (Signed)
Yes that is okay.  Thank you for letting me know.

## 2017-10-13 NOTE — Telephone Encounter (Signed)
TA-I spoke to pt who stated that the GI place called her and told her that she does not need a Colonoscopy for 2 more years/plz advise if she needs to get one now or if this is ok with you? thx dmf

## 2017-10-15 NOTE — Telephone Encounter (Signed)
Pt aware/thx dmf 

## 2017-11-01 ENCOUNTER — Ambulatory Visit
Admission: RE | Admit: 2017-11-01 | Discharge: 2017-11-01 | Disposition: A | Discharge status: Home / Self Care | Payer: Medicare HMO | Source: Ambulatory Visit | Attending: Family Medicine | Admitting: Family Medicine

## 2017-11-01 DIAGNOSIS — Z1231 Encounter for screening mammogram for malignant neoplasm of breast: Secondary | ICD-10-CM | POA: Insufficient documentation

## 2017-11-03 ENCOUNTER — Ambulatory Visit: Payer: Medicare HMO | Admitting: Family Medicine

## 2017-12-06 ENCOUNTER — Other Ambulatory Visit: Payer: Self-pay | Admitting: Family Medicine

## 2017-12-06 NOTE — Telephone Encounter (Signed)
Copied from [place] [id number]. Topic: Quick Communication - See Telephone Encounter >> Dec 06, 2017  1:03 PM Percell Belt A wrote: CRM for notification. See Telephone encounter for: 12/06/17. Pt called in and stated that she would like to get all 3 of these meds on the same refill date?    amLODipine (NORVASC) 5 MG tablet [[id number]] pravastatin (PRAVACHOL) 40 MG tablet [[id number]] hydrochlorothiazide (HYDRODIURIL) 25 MG tablet [[id number]]

## 2017-12-07 MED ORDER — HYDROCHLOROTHIAZIDE 25 MG PO TABS: 25.0000 mg | ORAL_TABLET | Freq: Every day | ORAL | 2 refills | Status: DC

## 2017-12-07 MED ORDER — PRAVASTATIN SODIUM 40 MG PO TABS: 40.0000 mg | ORAL_TABLET | Freq: Every day | ORAL | 2 refills | Status: DC

## 2017-12-07 MED ORDER — AMLODIPINE BESYLATE 5 MG PO TABS: 5.0000 mg | ORAL_TABLET | Freq: Every day | ORAL | 2 refills | Status: DC

## 2017-12-07 NOTE — Telephone Encounter (Signed)
Requested Prescriptions  Pending Prescriptions Disp Refills  . amLODipine (NORVASC) 5 MG tablet 90 tablet 2    Sig: Take 1 tablet (5 mg total) by mouth daily.     Cardiovascular:  Calcium Channel Blockers Failed - 12/06/2017  1:11 PM      Failed - Last BP in normal range    BP Readings from Last 1 Encounters:  09/22/17 140/80         Failed - Valid encounter within last 6 months    Recent Outpatient Visits          4 months ago Well woman exam   [facility], [place] M, MD   8 months ago Encounter for pre-operative cardiovascular clearance   [facility] [place], Marciano Sequin, MD           . pravastatin (PRAVACHOL) 40 MG tablet 90 tablet 2    Sig: Take 1 tablet (40 mg total) by mouth daily.     Cardiovascular:  Antilipid - Statins Failed - 12/06/2017  1:11 PM      Failed - Total Cholesterol in normal range and within 360 days    Cholesterol  Date Value Ref Range Status  07/19/2017 203 (H) 0 - 200 mg/dL Final    Comment:    ATP III Classification       Desirable:  < 200 mg/dL               Borderline High:  200 - 239 mg/dL          High:  > = 240 mg/dL         Failed - LDL in normal range and within 360 days    LDL Cholesterol  Date Value Ref Range Status  07/19/2017 119 (H) 0 - 99 mg/dL Final         Passed - HDL in normal range and within 360 days    HDL  Date Value Ref Range Status  07/19/2017 61.30 >39.00 mg/dL Final         Passed - Triglycerides in normal range and within 360 days    Triglycerides  Date Value Ref Range Status  07/19/2017 115.0 0.0 - 149.0 mg/dL Final    Comment:    Normal:  <150 mg/dLBorderline High:  150 - 199 mg/dL         Passed - Patient is not pregnant      Passed - Valid encounter within last 12 months    Recent Outpatient Visits          4 months ago Well woman exam   [facility] [place], Tanja [place] M, MD   8 months ago Encounter for pre-operative cardiovascular clearance   [facility] [place], Marciano Sequin, MD           . hydrochlorothiazide (HYDRODIURIL) 25 MG tablet 90 tablet 2    Sig: Take 1 tablet (25 mg total) by mouth daily.     Cardiovascular: Diuretics - Thiazide Failed - 12/06/2017  1:11 PM      Failed - Last BP in normal range    BP Readings from Last 1 Encounters:  09/22/17 140/80         Failed - Valid encounter within last 6 months    Recent Outpatient Visits          4 months ago Well woman exam   [facility] [place] [place], Marciano Sequin, MD   8 months ago  Encounter for pre-operative cardiovascular clearance   [facility] [place], Marciano Sequin, MD             Passed - Ca in normal range and within 360 days    Calcium  Date Value Ref Range Status  07/19/2017 9.9 8.4 - 10.5 mg/dL Final         Passed - Cr in normal range and within 360 days    Creatinine, Ser  Date Value Ref Range Status  07/19/2017 0.90 0.40 - 1.20 mg/dL Final         Passed - K in normal range and within 360 days    Potassium  Date Value Ref Range Status  07/19/2017 4.1 3.5 - 5.1 mEq/L Final         Passed - Na in normal range and within 360 days    Sodium  Date Value Ref Range Status  07/19/2017 139 135 - 145 mEq/L Final

## 2017-12-20 NOTE — Telephone Encounter (Signed)
Sent to wrong pharmacy. [place], [place] [phone] (Phone)  Please advise [phone]

## 2017-12-20 NOTE — Telephone Encounter (Signed)
Pt is out °

## 2017-12-21 ENCOUNTER — Other Ambulatory Visit: Payer: Self-pay

## 2017-12-21 MED ORDER — AMLODIPINE BESYLATE 5 MG PO TABS: 5.0000 mg | ORAL_TABLET | Freq: Every day | ORAL | 2 refills | Status: DC

## 2017-12-21 MED ORDER — HYDROCHLOROTHIAZIDE 25 MG PO TABS: 25.0000 mg | ORAL_TABLET | Freq: Every day | ORAL | 2 refills | Status: DC

## 2017-12-21 MED ORDER — PRAVASTATIN SODIUM 40 MG PO TABS: 40.0000 mg | ORAL_TABLET | Freq: Every day | ORAL | 2 refills | Status: DC

## 2017-12-21 NOTE — Telephone Encounter (Signed)
I spoke to pt/advised that she can get at least 2 weeks worth from the [facility] of all 3 Rx's till the [place] Rx's arrive/she agreed and said that she would have gotten earlier but she had a death in the family/I expressed our sympathy and advised if she needs anything to let us know/thx dmf

## 2017-12-21 NOTE — Addendum Note (Signed)
Addended by: Verlene Mayer A on: 12/21/2017 09:56 AM   Modules accepted: Orders

## 2018-03-07 DIAGNOSIS — S63502A Unspecified sprain of left wrist, initial encounter: Secondary | ICD-10-CM | POA: Diagnosis not present

## 2018-03-15 DIAGNOSIS — S63502A Unspecified sprain of left wrist, initial encounter: Secondary | ICD-10-CM | POA: Diagnosis not present

## 2018-04-19 NOTE — Progress Notes (Signed)
Subjective:   Monica Lindsey is a 73 y.o. female who presents for Medicare Annual (Subsequent) preventive examination.  Works part time for [employer].  Review of Systems: No ROS.  Medicare Wellness Visit. Additional risk factors are reflected in the social history. Cardiac Risk Factors include: advanced age (>70men, >48 women);dyslipidemia;hypertension;obesity (BMI >30kg/m2) Sleep patterns: sleeps well 5-6 hrs.  Home Safety/Smoke Alarms: Feels safe in home. Smoke alarms in place.  Lives in 1 story home. Walk in shower w/ grab rails.   Female:       Mammo- 11/02/17      Dexa scan-  Active order     CCS- 07/10/09- normal     Objective:     Vitals: BP 140/82 (BP Location: Left Arm, Patient Position: Sitting, Cuff Size: Normal)   Pulse 76   Ht 5\' 4"  (1.626 m)   Wt 184 lb 9.6 oz (83.7 kg)   SpO2 97%   BMI 31.69 kg/m   Body mass index is 31.69 kg/m.  Advanced Directives 04/20/2018 03/31/2017 07/09/2016 05/23/2013 05/23/2013  Does Patient Have a Medical Advance Directive? Yes Yes Yes Patient has advance directive, copy not in chart Patient does not have advance directive  Type of Advance Directive [place];Living will [place];Living will [place];Living will [place];Living will -  Does patient want to make changes to medical advance directive? No - Patient declined - - No change requested -  Copy of [place] in Chart? No - copy requested - No - copy requested - -  Pre-existing out of facility DNR order (yellow form or pink MOST form) - - - No No    Tobacco Social History   Tobacco Use  Smoking Status Never Smoker  Smokeless Tobacco Never Used     Counseling given: Not Answered   Clinical Intake:     Pain : No/denies pain                 Past Medical History:  Diagnosis Date  . Arthritis   . Constipation   . Frequent headaches   . Hay fever   .  Hyperlipidemia   . Hypertension    Past Surgical History:  Procedure Laterality Date  . ABDOMINAL HYSTERECTOMY    . BACK SURGERY  1983  . CARPOMETACARPAL ([place]) FUSION OF THUMB Right 04/07/2017   Procedure: CARPOMETACARPAL [facility]) FUSION OF THUMB;  Surgeon: Earnestine Leys, MD;  Location: [facility] ORS;  Service: Orthopedics;  Laterality: Right;  . HEMORRHOID SURGERY    . TOOTH EXTRACTION     Family History  Problem Relation Age of Onset  . Heart disease Mother   . Alcohol abuse Father   . Arthritis Father   . Heart disease Father   . Stroke Father   . Hypertension Father   . Breast cancer Paternal Aunt 60  . Breast cancer Cousin    Social History   Socioeconomic History  . Marital status: Widowed    Spouse name: Not on file  . Number of children: Not on file  . Years of education: Not on file  . Highest education level: Not on file  Occupational History  . Not on file  Social Needs  . Financial resource strain: Not on file  . Food insecurity:    Worry: Not on file    Inability: Not on file  . Transportation needs:    Medical: Not on file  Non-medical: Not on file  Tobacco Use  . Smoking status: Never Smoker  . Smokeless tobacco: Never Used  Substance and Sexual Activity  . Alcohol use: Yes    Comment: rare  . Drug use: No  . Sexual activity: Never  Lifestyle  . Physical activity:    Days per week: Not on file    Minutes per session: Not on file  . Stress: Not on file  Relationships  . Social connections:    Talks on phone: Not on file    Gets together: Not on file    Attends religious service: Not on file    Active member of club or organization: Not on file    Attends meetings of clubs or organizations: Not on file    Relationship status: Not on file  Other Topics Concern  . 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      Still working 3 days per week.  Very close to her children and grand  children.      Daughter, [name], is HPOA.  Has a living will.   Would desire CPR, would not desire prolonged life support if futile.             Outpatient Encounter Medications as of 04/20/2018  Medication Sig  . amLODipine (NORVASC) 5 MG tablet Take 1 tablet (5 mg total) by mouth daily.  Marland Kitchen aspirin EC 81 MG tablet Take 81 mg by mouth daily.  . Cyanocobalamin (VITAMIN B-12 PO) Place 1 tablet under the tongue daily.   . Flaxseed Oil (LINSEED OIL) OIL by Does not apply route.  . gabapentin (NEURONTIN) 400 MG capsule Take 1 capsule (400 mg total) by mouth 2 (two) times daily.  . hydrochlorothiazide (HYDRODIURIL) 25 MG tablet Take 1 tablet (25 mg total) by mouth daily.  . Multiple Vitamin (MULTIVITAMIN) tablet Take 1 tablet by mouth 2 (two) times daily.  . Omega-3 Fatty Acids (FISH OIL) 1000 MG CAPS Take 1,000 mg by mouth 2 (two) times daily.   . pravastatin (PRAVACHOL) 40 MG tablet Take 1 tablet (40 mg total) by mouth daily.  Marland Kitchen HYDROcodone-acetaminophen (NORCO) 5-325 MG tablet Take 1 tablet by mouth every 6 (six) hours as needed. (Patient not taking: Reported on 04/20/2018)   No facility-administered encounter medications on file as of 04/20/2018.     Activities of Daily Living In your present state of health, do you have any difficulty performing the following activities: 04/20/2018  Hearing? N  Vision? N  Comment wears readers.  Difficulty concentrating or making decisions? N  Walking or climbing stairs? N  Dressing or bathing? N  Doing errands, shopping? N  Preparing Food and eating ? N  Using the Toilet? N  In the past six months, have you accidently leaked urine? N  Do you have problems with loss of bowel control? N  Managing your Medications? N  Managing your Finances? N  Housekeeping or managing your Housekeeping? N  Some recent data might be hidden    Patient Care Team: Lucille Passy, MD as PCP - General (Family Medicine) Oh, Lupita Dawn, MD (Inactive) as Physician  Assistant (Internal Medicine) Barrie Dunker, MD as Counselor (Dermatology)    Assessment:   This is a routine wellness examination for [place]. Physical assessment deferred to PCP.  Exercise Activities and Dietary recommendations Current Exercise Habits: Home exercise routine, Type of exercise: walking, Time (Minutes): 20, Frequency (  Times/Week): 2, Weekly Exercise (Minutes/Week): 40, Intensity: Mild, Exercise limited by: None identified Diet (meal preparation, eat out, water intake, caffeinated beverages, dairy products, fruits and vegetables): in general, a "healthy" diet  , well balanced Drinks plenty of water.     Goals    . Increase physical activity    . Maintain a healthy diet       Fall Risk Fall Risk  04/20/2018 07/19/2017 07/09/2016 06/18/2015 06/11/2014  Falls in the past year? 1 No Yes Yes Yes  Comment - - pt had a fall in Nov and in Dec 2017; injury but no medical treatment - -  Number falls in past yr: 0 - 2 or more 1 2 or more  Comment - - - - -  Injury with Fall? 1 - No - No  Risk for fall due to : Other (Comment) - - - -  Risk for fall due to: Comment tripped over something - - - -    Depression Screen PHQ 2/9 Scores 04/20/2018 07/19/2017 07/09/2016 06/18/2015  PHQ - 2 Score 0 0 0 0     Cognitive Function MMSE - Mini Mental State Exam 04/20/2018 07/09/2016  Orientation to time 5 5  Orientation to Place 5 5  Registration 3 3  Attention/ Calculation 5 0  Recall 3 3  Language- name 2 objects 2 0  Language- repeat 1 1  Language- follow 3 step command 3 3  Language- read & follow direction 1 0  Write a sentence 1 0  Copy design 1 0  Total score 30 20        Immunization History  Administered Date(s) Administered  . DTaP 10/27/2007  . Influenza, Seasonal, Injecte, Preservative Fre 03/06/2009, 02/10/2011, 11/24/2011  . Influenza,inj,Quad PF,6+ Mos 12/06/2013, 10/10/2014, 11/27/2015, 12/01/2016  . Pneumococcal Conjugate-13 05/17/2013  .  Pneumococcal-Unspecified 03/06/2009  . Tdap 10/27/2007  . Zoster 10/27/2007   Screening Tests Health Maintenance  Topic Date Due  . DEXA SCAN  11/22/2008  . INFLUENZA VACCINE  09/02/2017  . TETANUS/TDAP  07/10/2026 (Originally 10/26/2017)  . COLONOSCOPY  07/11/2019  . MAMMOGRAM  11/02/2019  . Hepatitis C Screening  Completed  . PNA vac Low Risk Adult  Completed      Plan:    Please schedule your next medicare wellness visit with me in 1 yr.  Continue to eat heart healthy diet (full of fruits, vegetables, whole grains, lean protein, water--limit salt, fat, and sugar intake) and increase physical activity as tolerated.  Continue doing brain stimulating activities (puzzles, reading, adult coloring books, staying active) to keep memory sharp.   Bring a copy of your living will and/or healthcare power of attorney to your next office visit.    I have personally reviewed and noted the following in the patient's chart:   . Medical and social history . Use of alcohol, tobacco or illicit drugs  . Current medications and supplements . Functional ability and status . Nutritional status . Physical activity . Advanced directives . List of other physicians . Hospitalizations, surgeries, and ER visits in previous 12 months . Vitals . Screenings to include cognitive, depression, and falls . Referrals and appointments  In addition, I have reviewed and discussed with patient certain preventive protocols, quality metrics, and best practice recommendations. A written personalized care plan for preventive services as well as general preventive health recommendations were provided to patient.     Shela Nevin, [place]  04/20/2018

## 2018-04-20 ENCOUNTER — Encounter: Payer: Self-pay | Admitting: *Deleted

## 2018-04-20 ENCOUNTER — Other Ambulatory Visit: Payer: Self-pay

## 2018-04-20 ENCOUNTER — Ambulatory Visit (INDEPENDENT_AMBULATORY_CARE_PROVIDER_SITE_OTHER): Payer: Medicare HMO | Admitting: *Deleted

## 2018-04-20 VITALS — BP 140/82 | HR 76 | Ht 64.0 in | Wt 184.6 lb

## 2018-04-20 DIAGNOSIS — Z0000 Encounter for general adult medical examination without abnormal findings: Secondary | ICD-10-CM | POA: Diagnosis not present

## 2018-04-20 NOTE — Progress Notes (Signed)
Medical screening examination/treatment/procedure(s) were performed by the Wellness Coach, RN. As primary care provider I was immediately available for consulation/collaboration. I agree with above documentation. Pia Jedlicka, AGNP-C 

## 2018-04-20 NOTE — Patient Instructions (Signed)
Please schedule your next medicare wellness visit with me in 1 yr.  Continue to eat heart healthy diet (full of fruits, vegetables, whole grains, lean protein, water--limit salt, fat, and sugar intake) and increase physical activity as tolerated.  Continue doing brain stimulating activities (puzzles, reading, adult coloring books, staying active) to keep memory sharp.   Bring a copy of your living will and/or healthcare power of attorney to your next office visit.   Monica Lindsey , Thank you for taking time to come for your Medicare Wellness Visit. I appreciate your ongoing commitment to your health goals. Please review the following plan we discussed and let me know if I can assist you in the future.   These are the goals we discussed: Goals    . Increase physical activity    . Maintain a healthy diet       This is a list of the screening recommended for you and due dates:  Health Maintenance  Topic Date Due  . DEXA scan (bone density measurement)  11/22/2008  . Flu Shot  09/02/2017  . Tetanus Vaccine  07/10/2026*  . Colon Cancer Screening  07/11/2019  . Mammogram  11/02/2019  .  Hepatitis C: One time screening is recommended by Center for Disease Control  (CDC) for  adults born from [birth year] through 1967.   Completed  . Pneumonia vaccines  Completed  *Topic was postponed. The date shown is not the original due date.    Health Maintenance After Age 24 After age 14, you are at a higher risk for certain long-term diseases and infections as well as injuries from falls. Falls are a major cause of broken bones and head injuries in people who are older than age 20. Getting regular preventive care can help to keep you healthy and well. Preventive care includes getting regular testing and making lifestyle changes as recommended by your health care provider. Talk with your health care provider about:  Which screenings and tests you should have. A screening is a test that checks for a disease when  you have no symptoms.  A diet and exercise plan that is right for you. What should I know about screenings and tests to prevent falls? Screening and testing are the best ways to find a health problem early. Early diagnosis and treatment give you the best chance of managing medical conditions that are common after age 22. Certain conditions and lifestyle choices may make you more likely to have a fall. Your health care provider may recommend:  Regular vision checks. Poor vision and conditions such as cataracts can make you more likely to have a fall. If you wear glasses, make sure to get your prescription updated if your vision changes.  Medicine review. Work with your health care provider to regularly review all of the medicines you are taking, including over-the-counter medicines. Ask your health care provider about any side effects that may make you more likely to have a fall. Tell your health care provider if any medicines that you take make you feel dizzy or sleepy.  Osteoporosis screening. Osteoporosis is a condition that causes the bones to get weaker. This can make the bones weak and cause them to break more easily.  Blood pressure screening. Blood pressure changes and medicines to control blood pressure can make you feel dizzy.  Strength and balance checks. Your health care provider may recommend certain tests to check your strength and balance while standing, walking, or changing positions.  Foot health exam. Foot  pain and numbness, as well as not wearing proper footwear, can make you more likely to have a fall.  Depression screening. You may be more likely to have a fall if you have a fear of falling, feel emotionally low, or feel unable to do activities that you used to do.  Alcohol use screening. Using too much alcohol can affect your balance and may make you more likely to have a fall. What actions can I take to lower my risk of falls? General instructions  Talk with your health  care provider about your risks for falling. Tell your health care provider if: ? You fall. Be sure to tell your health care provider about all falls, even ones that seem minor. ? You feel dizzy, sleepy, or off-balance.  Take over-the-counter and prescription medicines only as told by your health care provider. These include any supplements.  Eat a healthy diet and maintain a healthy weight. A healthy diet includes low-fat dairy products, low-fat (lean) meats, and fiber from whole grains, beans, and lots of fruits and vegetables. Home safety  Remove any tripping hazards, such as rugs, cords, and clutter.  Install safety equipment such as grab bars in bathrooms and safety rails on stairs.  Keep rooms and walkways well-lit. Activity   Follow a regular exercise program to stay fit. This will help you maintain your balance. Ask your health care provider what types of exercise are appropriate for you.  If you need a cane or walker, use it as recommended by your health care provider.  Wear supportive shoes that have nonskid soles. Lifestyle  Do not drink alcohol if your health care provider tells you not to drink.  If you drink alcohol, limit how much you have: ? 0-1 drink a day for women. ? 0-2 drinks a day for men.  Be aware of how much alcohol is in your drink. In the [place] one drink equals one typical bottle of beer (12 oz), one-half glass of wine (5 oz), or one shot of hard liquor (1 oz).  Do not use any products that contain nicotine or tobacco, such as cigarettes and e-cigarettes. If you need help quitting, ask your health care provider. Summary  Having a healthy lifestyle and getting preventive care can help to protect your health and wellness after age 25.  Screening and testing are the best way to find a health problem early and help you avoid having a fall. Early diagnosis and treatment give you the best chance for managing medical conditions that are more common for people  who are older than age 86.  Falls are a major cause of broken bones and head injuries in people who are older than age 78. Take precautions to prevent a fall at home.  Work with your health care provider to learn what changes you can make to improve your health and wellness and to prevent falls. This information is not intended to replace advice given to you by your health care provider. Make sure you discuss any questions you have with your health care provider. Document Released: 12/02/2016 Document Revised: 12/02/2016 Document Reviewed: 12/02/2016 Elsevier Interactive Patient Education  2019 [employer].

## 2018-08-24 ENCOUNTER — Encounter: Payer: Medicare HMO | Admitting: Family Medicine

## 2018-09-14 ENCOUNTER — Other Ambulatory Visit: Payer: Self-pay | Admitting: Family Medicine

## 2018-10-04 ENCOUNTER — Telehealth: Payer: Self-pay

## 2018-10-04 DIAGNOSIS — E538 Deficiency of other specified B group vitamins: Secondary | ICD-10-CM | POA: Insufficient documentation

## 2018-10-04 NOTE — Telephone Encounter (Signed)
Questions for Screening COVID-19  Symptom onset: None  Travel or Contacts: None  During this illness, did/does the patient experience any of the following symptoms? Fever >100.45F []   Yes [x]   No []   Unknown Subjective fever (felt feverish) []   Yes [x]   No []   Unknown Chills []   Yes [x]   No []   Unknown Muscle aches (myalgia) []   Yes [x]   No []   Unknown Runny nose (rhinorrhea) []   Yes [x]   No []   Unknown Sore throat []   Yes [x]   No []   Unknown Cough (new onset or worsening of chronic cough) []   Yes [x]   No []   Unknown Shortness of breath (dyspnea) []   Yes [x]   No []   Unknown Nausea or vomiting []   Yes [x]   No []   Unknown Headache []   Yes [x]   No []   Unknown Abdominal pain  []   Yes [x]   No []   Unknown Diarrhea (?3 loose/looser than normal stools/24hr period) []   Yes [x]   No []   Unknown Other, specify:  Patient risk factors: Smoker? []   Current []   Former []   Never If female, currently pregnant? []   Yes []   No  Patient Active Problem List   Diagnosis Date Noted  . B12 deficiency 10/04/2018  . Well woman exam 07/19/2017  . Obesity, unspecified 05/17/2013  . HTN (hypertension) 03/23/2013  . HLD (hyperlipidemia) 03/23/2013  . OA (osteoarthritis) 03/23/2013    Plan:  []   High risk for COVID-19 with red flags go to ED (with CP, SOB, weak/lightheaded, or fever > 101.5). Call ahead.  []   High risk for COVID-19 but stable. Inform provider and coordinate time for [facility] visit.   []   No red flags but URI signs or symptoms okay for [facility] visit.

## 2018-10-04 NOTE — Assessment & Plan Note (Addendum)
Has a high 10 year ASCVD risk score so I would like for her to continue current dose of statin.   Check labs today.  If labs improved, will continue statin but could decrease dose. The patient indicates understanding of these issues and agrees with the plan.  Orders Placed This Encounter  Procedures  . DG Bone Density  . CBC with Differential/Platelet  . Comprehensive metabolic panel  . Lipid panel  . TSH  . B12

## 2018-10-04 NOTE — Assessment & Plan Note (Addendum)
Reviewed preventive care protocols, scheduled due services, and updated immunizations Discussed nutrition, exercise, diet, and healthy lifestyle.  Mammogram and DEXA ordered and phone number for breast center given to pt to schedule her own appointments.  Received flu shot today, sent shingrix rx to pharmacy on file.

## 2018-10-04 NOTE — Assessment & Plan Note (Addendum)
Well controlled on current rxs.  No changes made. Labs today.

## 2018-10-04 NOTE — Assessment & Plan Note (Signed)
Check B12 today. 

## 2018-10-04 NOTE — Patient Instructions (Addendum)
Great to see you. I will call you with your lab results from today and you can view them online.  If you cholesterol looks better, we can decrease your dose of pravachol.  Please call [facility] to schedule your bone density and mammogram - you can call them now but insurance will not pay for mammogram until 11/02/18.  [phone]  :)  Happy [birth year] on [birth year]!!!  I have sent your prescription for shingrix to [facility].  Just give them a call.

## 2018-10-04 NOTE — Progress Notes (Signed)
Subjective:   Patient ID: Monica Lindsey, female    DOB: 1945-03-27, 73 y.o.   MRN: [id number]  Monica Lindsey is a pleasant 73 y.o. year old female who presents to clinic today with Annual Exam and Follow-up (Pt screened at vehicle. She is here today to F/U. She is fasting and would like to get the flu shot)  and follow up of chronic medical conditions on 10/05/2018  HPI:  Had medicare wellness visit with Gary Fleet, RN on 04/20/18.  Note reviewed.  Doing okay during the pandemic.  Staying active.  Health Maintenance  Topic Date Due  . DEXA SCAN  11/22/2008  . INFLUENZA VACCINE  09/03/2018  . TETANUS/TDAP  07/10/2026 (Originally 10/26/2017)  . COLONOSCOPY  07/11/2019  . MAMMOGRAM  11/02/2019  . Hepatitis C Screening  Completed  . PNA vac Low Risk Adult  Completed   I ordered bone density last July but I do not see the results.  Mammogram completed on 11/01/17.  Depression screen [facility] 2/9 04/20/2018 07/19/2017 07/09/2016 06/18/2015 06/11/2014  Decreased Interest 0 0 0 0 1  Down, Depressed, Hopeless 0 0 0 0 0  PHQ - 2 Score 0 0 0 0 1     HTN- on HCTZ 25 mg and amlodipine 5 mg daily.  Denies CP ,SOB or LE edema.  Lab Results  Component Value Date   NA 139 07/19/2017   K 4.1 07/19/2017   CL 103 07/19/2017   CO2 27 07/19/2017   Lab Results  Component Value Date   CREATININE 0.90 07/19/2017     HLD- on pravastatin 40 mg daily.  Denies myalgias. Lab Results  Component Value Date   CHOL 203 (H) 07/19/2017   HDL 61.30 07/19/2017   LDLCALC 119 (H) 07/19/2017   LDLDIRECT 127.3 03/23/2013   TRIG 115.0 07/19/2017   CHOLHDL 3 07/19/2017   The 10-year ASCVD risk score Mikey Bussing DC Jr., et al., 2013) is: 17.4%   Values used to calculate the score:     Age: 36 years     Sex: Female     Is Non-Hispanic African American: No     Diabetic: No     Tobacco smoker: No     Systolic Blood Pressure: 123XX123 mmHg     Is BP treated: Yes     HDL Cholesterol: 61.3 mg/dL     Total Cholesterol:  203 mg/dL   Lab Results  Component Value Date   CREATININE 0.90 07/19/2017   Lab Results  Component Value Date   ALT 29 07/19/2017   AST 21 07/19/2017   ALKPHOS 117 07/19/2017   BILITOT 0.5 07/19/2017   Lab Results  Component Value Date   NA 139 07/19/2017   K 4.1 07/19/2017   CL 103 07/19/2017   CO2 27 07/19/2017        Patient Active Problem List   Diagnosis Date Noted  . B12 deficiency 10/04/2018  . Well woman exam 07/19/2017  . Obesity, unspecified 05/17/2013  . HTN (hypertension) 03/23/2013  . HLD (hyperlipidemia) 03/23/2013  . OA (osteoarthritis) 03/23/2013   Past Medical History:  Diagnosis Date  . Arthritis   . Constipation   . Frequent headaches   . Hay fever   . Hyperlipidemia   . Hypertension    Past Surgical History:  Procedure Laterality Date  . ABDOMINAL HYSTERECTOMY    . BACK SURGERY  1983  . CARPOMETACARPAL ([facility]) FUSION OF THUMB Right 04/07/2017   Procedure: CARPOMETACARPAL ([place]) FUSION OF THUMB;  Surgeon: Earnestine Leys, MD;  Location: [facility] ORS;  Service: Orthopedics;  Laterality: Right;  . HEMORRHOID SURGERY    . TOOTH EXTRACTION     Social History   Tobacco Use  . Smoking status: Never Smoker  . Smokeless tobacco: Never Used  Substance Use Topics  . Alcohol use: Yes    Comment: rare  . Drug use: No   Family History  Problem Relation Age of Onset  . Heart disease Mother   . Alcohol abuse Father   . Arthritis Father   . Heart disease Father   . Stroke Father   . Hypertension Father   . Breast cancer Paternal Aunt 31  . Breast cancer Cousin    Allergies  Allergen Reactions  . Demerol [Meperidine] Other (See Comments)    Makes her "looney"   Current Outpatient Medications on File Prior to Visit  Medication Sig Dispense Refill  . amLODipine (NORVASC) 5 MG tablet TAKE 1 TABLET EVERY DAY 90 tablet 2  . aspirin EC 81 MG tablet Take 81 mg by mouth daily.    . Cyanocobalamin (VITAMIN B-12 PO) Place 1 tablet under the tongue  daily.     . Flaxseed Oil (LINSEED OIL) OIL by Does not apply route.    . gabapentin (NEURONTIN) 400 MG capsule Take 1 capsule (400 mg total) by mouth 2 (two) times daily. 60 capsule 3  . hydrochlorothiazide (HYDRODIURIL) 25 MG tablet TAKE 1 TABLET EVERY DAY 90 tablet 2  . Multiple Vitamin (MULTIVITAMIN) tablet Take 1 tablet by mouth 2 (two) times daily.    . Omega-3 Fatty Acids (FISH OIL) 1000 MG CAPS Take 1,000 mg by mouth 2 (two) times daily.     . pravastatin (PRAVACHOL) 40 MG tablet TAKE 1 TABLET EVERY DAY 90 tablet 2   No current facility-administered medications on file prior to visit.    The PMH, PSH, Social History, Family History, Medications, and allergies have been reviewed in [facility], and have been updated if relevant.    Review of Systems  Constitutional: Negative.   HENT: Negative.   Eyes: Negative.   Respiratory: Negative.   Cardiovascular: Negative.   Gastrointestinal: Negative.   Endocrine: Negative.   Genitourinary: Negative.   Musculoskeletal: Negative for arthralgias and myalgias.  Allergic/Immunologic: Negative.   Neurological: Negative.   Hematological: Negative.   Psychiatric/Behavioral: Negative.   All other systems reviewed and are negative.     Objective:    BP 122/84 (BP Location: Left Arm, Patient Position: Sitting, Cuff Size: Normal)   Pulse 91   Temp 98.3 F (36.8 C) (Oral)   Ht 5' 3.5" (1.613 m)   Wt 180 lb 12.8 oz (82 kg)   SpO2 96%   BMI 31.52 kg/m   Wt Readings from Last 3 Encounters:  10/05/18 180 lb 12.8 oz (82 kg)  04/20/18 184 lb 9.6 oz (83.7 kg)  09/22/17 183 lb 4 oz (83.1 kg)    Physical Exam   General:  Well-developed,well-nourished,in no acute distress; alert,appropriate and cooperative throughout examination Head:  normocephalic and atraumatic.   Eyes:  vision grossly intact, PERRL Ears:  R ear normal and L ear normal externally, TMs clear bilaterally Nose:  no external deformity.   Mouth:  good dentition.   Neck:  No  deformities, masses, or tenderness noted. Breasts:  No mass, nodules, thickening, tenderness, bulging, retraction, inflamation, nipple discharge or skin changes noted.   Lungs:  Normal respiratory effort, chest expands symmetrically. Lungs are clear to auscultation, no crackles or  wheezes. Heart:  Normal rate and regular rhythm. S1 and S2 normal without gallop, murmur, click, rub or other extra sounds. Abdomen:  Bowel sounds positive,abdomen soft and non-tender without masses, organomegaly or hernias noted. Msk:  No deformity or scoliosis noted of thoracic or lumbar spine.   Extremities:  No clubbing, cyanosis, edema, or deformity noted with normal full range of motion of all joints.   Neurologic:  alert & oriented X3 and gait normal.   Skin:  Intact without suspicious lesions or rashes Cervical Nodes:  No lymphadenopathy noted Axillary Nodes:  No palpable lymphadenopathy Psych:  Cognition and judgment appear intact. Alert and cooperative with normal attention span and concentration. No apparent delusions, illusions, hallucinations      Assessment & Plan:   Well woman exam  Osteoarthritis, unspecified osteoarthritis type, unspecified site  Hyperlipidemia, unspecified hyperlipidemia type - Plan: CBC with Differential/Platelet, Lipid panel, TSH  Essential hypertension - Plan: Comprehensive metabolic panel  123456 deficiency - Plan: B12  Estrogen deficiency - Plan: DG Bone Density  Screening for breast cancer - Plan: MM 3D SCREEN BREAST BILATERAL  Need for influenza vaccination - Plan: Flu Vaccine QUAD High Dose(Fluad) No follow-ups on file.

## 2018-10-05 ENCOUNTER — Encounter: Payer: Self-pay | Admitting: Family Medicine

## 2018-10-05 ENCOUNTER — Ambulatory Visit (INDEPENDENT_AMBULATORY_CARE_PROVIDER_SITE_OTHER): Payer: Medicare HMO | Admitting: Family Medicine

## 2018-10-05 ENCOUNTER — Other Ambulatory Visit: Payer: Self-pay

## 2018-10-05 VITALS — BP 122/84 | HR 91 | Temp 98.3°F | Ht 63.5 in | Wt 180.8 lb

## 2018-10-05 DIAGNOSIS — Z1239 Encounter for other screening for malignant neoplasm of breast: Secondary | ICD-10-CM | POA: Diagnosis not present

## 2018-10-05 DIAGNOSIS — Z01419 Encounter for gynecological examination (general) (routine) without abnormal findings: Secondary | ICD-10-CM | POA: Diagnosis not present

## 2018-10-05 DIAGNOSIS — M1990 Unspecified osteoarthritis, unspecified site: Secondary | ICD-10-CM | POA: Diagnosis not present

## 2018-10-05 DIAGNOSIS — I10 Essential (primary) hypertension: Secondary | ICD-10-CM

## 2018-10-05 DIAGNOSIS — Z23 Encounter for immunization: Secondary | ICD-10-CM | POA: Diagnosis not present

## 2018-10-05 DIAGNOSIS — E785 Hyperlipidemia, unspecified: Secondary | ICD-10-CM | POA: Diagnosis not present

## 2018-10-05 DIAGNOSIS — E538 Deficiency of other specified B group vitamins: Secondary | ICD-10-CM | POA: Diagnosis not present

## 2018-10-05 DIAGNOSIS — E2839 Other primary ovarian failure: Secondary | ICD-10-CM | POA: Diagnosis not present

## 2018-10-05 LAB — COMPREHENSIVE METABOLIC PANEL
ALT: 19 U/L (ref 0–35)
AST: 22 U/L (ref 0–37)
Albumin: 4.4 g/dL (ref 3.5–5.2)
Alkaline Phosphatase: 67 U/L (ref 39–117)
BUN: 21 mg/dL (ref 6–23)
CO2: 27 mEq/L (ref 19–32)
Calcium: 10.1 mg/dL (ref 8.4–10.5)
Chloride: 101 mEq/L (ref 96–112)
Creatinine, Ser: 1.06 mg/dL (ref 0.40–1.20)
GFR: 50.55 mL/min — ABNORMAL LOW (ref >60.00)
Glucose, Bld: 100 mg/dL — ABNORMAL HIGH (ref 70–99)
Potassium: 3.8 mEq/L (ref 3.5–5.1)
Sodium: 138 mEq/L (ref 135–145)
Total Bilirubin: 0.6 mg/dL (ref 0.2–1.2)
Total Protein: 7.4 g/dL (ref 6.0–8.3)

## 2018-10-05 LAB — LIPID PANEL
Cholesterol: 185 mg/dL (ref 0–200)
HDL: 57.6 mg/dL (ref >39.00)
LDL Cholesterol: 109 mg/dL — ABNORMAL HIGH (ref 0–99)
NonHDL: 127.12
Total CHOL/HDL Ratio: 3
Triglycerides: 91 mg/dL (ref 0.0–149.0)
VLDL: 18.2 mg/dL (ref 0.0–40.0)

## 2018-10-05 LAB — CBC WITH DIFFERENTIAL/PLATELET
Basophils Absolute: 0 10*3/uL (ref 0.0–0.1)
Basophils Relative: 0.7 % (ref 0.0–3.0)
Eosinophils Absolute: 0.1 10*3/uL (ref 0.0–0.7)
Eosinophils Relative: 0.8 % (ref 0.0–5.0)
HCT: 44 % (ref 36.0–46.0)
Hemoglobin: 14.9 g/dL (ref 12.0–15.0)
Lymphocytes Relative: 24.4 % (ref 12.0–46.0)
Lymphs Abs: 1.7 10*3/uL (ref 0.7–4.0)
MCHC: 33.8 g/dL (ref 30.0–36.0)
MCV: 86.3 fl (ref 78.0–100.0)
Monocytes Absolute: 0.6 10*3/uL (ref 0.1–1.0)
Monocytes Relative: 8.1 % (ref 3.0–12.0)
Neutro Abs: 4.7 10*3/uL (ref 1.4–7.7)
Neutrophils Relative %: 66 % (ref 43.0–77.0)
Platelets: 267 10*3/uL (ref 150.0–400.0)
RBC: 5.1 Mil/uL (ref 3.87–5.11)
RDW: 14.4 % (ref 11.5–15.5)
WBC: 7.1 10*3/uL (ref 4.0–10.5)

## 2018-10-05 LAB — TSH: TSH: 1.15 u[IU]/mL (ref 0.35–4.50)

## 2018-10-05 LAB — VITAMIN B12: Vitamin B-12: 1024 pg/mL — ABNORMAL HIGH (ref 211–911)

## 2018-10-05 MED ORDER — SHINGRIX 50 MCG/0.5ML IM SUSR: 0.5000 mL | Freq: Once | INTRAMUSCULAR | 1 refills | Status: AC

## 2018-10-17 ENCOUNTER — Other Ambulatory Visit: Payer: Self-pay

## 2018-10-17 ENCOUNTER — Ambulatory Visit: Payer: Self-pay | Admitting: *Deleted

## 2018-10-17 NOTE — Telephone Encounter (Signed)
   Reason for Disposition . [1] MODERATE back pain (e.g., interferes with normal activities) AND [2] present > 3 days  Answer Assessment - Initial Assessment Questions 1. ONSET: "When did the pain begin?"      Pain has been present prior to physical - Patient was using Aleve- gets better and then will flare 2. LOCATION: "Where does it hurt?" (upper, mid or lower back)  left shoulder blade area- mid back muscle 3. SEVERITY: "How bad is the pain?"  (e.g., Scale 1-10; mild, moderate, or severe)   - MILD (1-3): doesn't interfere with normal activities    - MODERATE (4-7): interferes with normal activities or awakens from sleep    - SEVERE (8-10): excruciating pain, unable to do any normal activities      Pain is starting to come back 4. PATTERN: "Is the pain constant?" (e.g., yes, no; constant, intermittent)      Off/on 5. RADIATION: "Does the pain shoot into your legs or elsewhere?"     Under left breat gets uncomfortable 6. CAUSE:  "What do you think is causing the back pain?"      Patient thought it was muscle pull 7. BACK OVERUSE:  "Any recent lifting of heavy objects, strenuous work or exercise?"     Patient had been working out in garage- but she doesn't think she hurt herself 8. MEDICATIONS: "What have you taken so far for the pain?" (e.g., nothing, acetaminophen, NSAIDS)     Advil, aspirin  9. NEUROLOGIC SYMPTOMS: "Do you have any weakness, numbness, or problems with bowel/bladder control?"     no 10. OTHER SYMPTOMS: "Do you have any other symptoms?" (e.g., fever, abdominal pain, burning with urination, blood in urine)       no 11. PREGNANCY: "Is there any chance you are pregnant?" (e.g., yes, no; LMP)       n/a  Protocols used: BACK PAIN-A-AH

## 2018-10-18 ENCOUNTER — Encounter: Payer: Self-pay | Admitting: Family Medicine

## 2018-10-18 ENCOUNTER — Ambulatory Visit (INDEPENDENT_AMBULATORY_CARE_PROVIDER_SITE_OTHER): Payer: Medicare HMO | Admitting: Family Medicine

## 2018-10-18 VITALS — BP 132/76 | HR 81 | Temp 97.8°F | Wt 185.0 lb

## 2018-10-18 DIAGNOSIS — M1990 Unspecified osteoarthritis, unspecified site: Secondary | ICD-10-CM | POA: Diagnosis not present

## 2018-10-18 DIAGNOSIS — M62830 Muscle spasm of back: Secondary | ICD-10-CM | POA: Diagnosis not present

## 2018-10-18 NOTE — Assessment & Plan Note (Signed)
New- Advised heat, massage, exercises given from sports medicine advisor. Call or send my chart message prn if these symptoms worsen or fail to improve as anticipated. The patient indicates understanding of these issues and agrees with the plan.

## 2018-10-18 NOTE — Progress Notes (Signed)
SUBJECTIVE:  Monica Lindsey is a 73 y.o. female who complains left thoracic pain for 2 week(s),getting progressively worse.  It has gotten better.    Precipitating factors: none recalled by the patient. Prior history of back problems: recurrent self limited episodes of low back pain in the past.   Past medical history significant for OA.   Current Outpatient Medications on File Prior to Visit  Medication Sig Dispense Refill  . amLODipine (NORVASC) 5 MG tablet TAKE 1 TABLET EVERY DAY 90 tablet 2  . aspirin EC 81 MG tablet Take 81 mg by mouth daily.    . Cyanocobalamin (VITAMIN B-12 PO) Place 1 tablet under the tongue daily.     . Flaxseed Oil (LINSEED OIL) OIL by Does not apply route.    . gabapentin (NEURONTIN) 400 MG capsule Take 1 capsule (400 mg total) by mouth 2 (two) times daily. 60 capsule 3  . hydrochlorothiazide (HYDRODIURIL) 25 MG tablet TAKE 1 TABLET EVERY DAY 90 tablet 2  . Multiple Vitamin (MULTIVITAMIN) tablet Take 1 tablet by mouth 2 (two) times daily.    . Omega-3 Fatty Acids (FISH OIL) 1000 MG CAPS Take 1,000 mg by mouth 2 (two) times daily.     . pravastatin (PRAVACHOL) 40 MG tablet TAKE 1 TABLET EVERY DAY 90 tablet 2   No current facility-administered medications on file prior to visit.     Allergies  Allergen Reactions  . Demerol [Meperidine] Other (See Comments)    Makes her "looney"    Past Medical History:  Diagnosis Date  . Arthritis   . Constipation   . Frequent headaches   . Hay fever   . Hyperlipidemia   . Hypertension     Past Surgical History:  Procedure Laterality Date  . ABDOMINAL HYSTERECTOMY    . BACK SURGERY  1983  . CARPOMETACARPAL ([facility]) FUSION OF THUMB Right 04/07/2017   Procedure: CARPOMETACARPAL [facility]) FUSION OF THUMB;  Surgeon: Earnestine Leys, MD;  Location: [facility] ORS;  Service: Orthopedics;  Laterality: Right;  . HEMORRHOID SURGERY    . TOOTH EXTRACTION      Family History  Problem Relation Age of Onset  . Heart disease Mother   .  Alcohol abuse Father   . Arthritis Father   . Heart disease Father   . Stroke Father   . Hypertension Father   . Breast cancer Paternal Aunt 64  . Breast cancer Cousin     Social History   Socioeconomic History  . Marital status: Widowed    Spouse name: Not on file  . Number of children: Not on file  . Years of education: Not on file  . Highest education level: Not on file  Occupational History  . Not on file  Social Needs  . Financial resource strain: Not on file  . Food insecurity    Worry: Not on file    Inability: Not on file  . Transportation needs    Medical: Not on file    Non-medical: Not on file  Tobacco Use  . Smoking status: Never Smoker  . Smokeless tobacco: Never Used  Substance and Sexual Activity  . Alcohol use: Yes    Comment: rare  . Drug use: No  . Sexual activity: Never  Lifestyle  . Physical activity    Days per week: Not on file    Minutes per session: Not on file  . Stress: Not on file  Relationships  . Social Herbalist on phone: Not  on file    Gets together: Not on file    Attends religious service: Not on file    Active member of club or organization: Not on file    Attends meetings of clubs or organizations: Not on file    Relationship status: Not on file  . Intimate partner violence    Fear of current or ex partner: Not on file    Emotionally abused: Not on file    Physically abused: Not on file    Forced sexual activity: Not on file  Other Topics Concern  . 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   Widowed.      Still working 3 days per week.  Very close to her children and grand children.      Daughter, [name], is HPOA.  Has a living will.   Would desire CPR, would not desire prolonged life support if futile.            The PMH, PSH, Social History, Family History, Medications, and allergies have been reviewed in [facility], and have been updated if relevant.    OBJECTIVE: BP 132/76   Pulse 81   Temp 97.8 F (36.6 C)   Wt 185 lb (83.9 kg)   SpO2 98%   BMI 32.26 kg/m    Physical Exam  Constitutional: She is oriented to person, place, and time and well-developed, well-nourished, and in no distress. No distress.  HENT:  Head: Normocephalic and atraumatic.  Eyes: Pupils are equal, round, and reactive to light.  Neck: Neck supple.  Pulmonary/Chest: Effort normal and breath sounds normal.  Musculoskeletal:     Thoracic back: She exhibits spasm. She exhibits no bony tenderness.       Back:  Neurological: She is alert and oriented to person, place, and time.  Skin: Skin is warm and dry. She is not diaphoretic.  Psychiatric: Mood, memory, affect and judgment normal.  Nursing note and vitals reviewed.

## 2018-10-18 NOTE — Patient Instructions (Signed)

## 2018-12-01 ENCOUNTER — Ambulatory Visit
Admission: RE | Admit: 2018-12-01 | Discharge: 2018-12-01 | Disposition: A | Discharge status: Home / Self Care | Payer: Medicare HMO | Source: Ambulatory Visit | Attending: Family Medicine | Admitting: Family Medicine

## 2018-12-01 DIAGNOSIS — Z1231 Encounter for screening mammogram for malignant neoplasm of breast: Secondary | ICD-10-CM | POA: Insufficient documentation

## 2018-12-01 DIAGNOSIS — Z780 Asymptomatic menopausal state: Secondary | ICD-10-CM | POA: Diagnosis not present

## 2018-12-01 DIAGNOSIS — M85852 Other specified disorders of bone density and structure, left thigh: Secondary | ICD-10-CM | POA: Insufficient documentation

## 2018-12-01 DIAGNOSIS — E2839 Other primary ovarian failure: Secondary | ICD-10-CM | POA: Diagnosis not present

## 2018-12-01 DIAGNOSIS — Z1239 Encounter for other screening for malignant neoplasm of breast: Secondary | ICD-10-CM

## 2019-01-05 ENCOUNTER — Other Ambulatory Visit: Payer: Self-pay

## 2019-01-05 DIAGNOSIS — Z20822 Contact with and (suspected) exposure to covid-19: Secondary | ICD-10-CM

## 2019-01-08 LAB — NOVEL CORONAVIRUS, NAA: SARS-CoV-2, NAA: NOT DETECTED

## 2019-02-07 NOTE — Progress Notes (Addendum)
Virtual Visit via Video   Due to the COVID-19 pandemic, this visit was completed with telemedicine (audio/video) technology to reduce patient and provider exposure as well as to preserve personal protective equipment.   I connected with Monica Lindsey by a video enabled telemedicine application and verified that I am speaking with the correct person using two identifiers. Location patient: Home Location provider: [facility], Office Persons participating in the virtual visit: Monica Lindsey, Arnette Norris, MD   I discussed the limitations of evaluation and management by telemedicine and the availability of in person appointments. The patient expressed understanding and agreed to proceed.   Interactive audio and video telecommunications were attempted between this provider and patient, however failed, due to patient having technical difficulties OR patient did not have access to video capability.  We continued and completed visit with audio only.  Care Team   Patient Care Team: Lucille Passy, MD as PCP - General (Family Medicine) Oh, Lupita Dawn, MD (Inactive) as Physician Assistant (Internal Medicine) Barrie Dunker, MD as Counselor (Dermatology)  Subjective:   HPI:   Bilateral hand and foot tingling- she questions if it is neuropathy.  Has been going on for a long time- 8- 10 years.   Started to become more tingly for past two months.   Wearing splints at night were helping with the fingers tingling but not helping as much now.  Lab Results  Component Value Date   VITAMINB12 1,024 (H) 10/05/2018    Lab Results  Component Value Date   TSH 1.15 10/05/2018      Review of Systems  Constitutional: Negative.  Negative for fever and malaise/fatigue.  HENT: Negative.  Negative for congestion and hearing loss.   Eyes: Negative.  Negative for blurred vision, discharge and redness.  Respiratory: Negative for cough and shortness of breath.   Cardiovascular: Negative.   Negative for chest pain, palpitations and leg swelling.  Gastrointestinal: Negative.  Negative for abdominal pain and heartburn.  Genitourinary: Negative for dysuria.  Musculoskeletal: Negative.  Negative for falls.  Skin: Negative.  Negative for rash.  Neurological: Positive for tingling. Negative for tremors, sensory change, speech change, focal weakness, seizures, loss of consciousness, weakness and headaches.  Endo/Heme/Allergies: Negative.  Does not bruise/bleed easily.  Psychiatric/Behavioral: Negative.  Negative for depression.  All other systems reviewed and are negative.    Patient Active Problem List   Diagnosis Date Noted  . Peripheral neuropathy 02/08/2019  . Spasm of thoracic back muscle 10/18/2018  . B12 deficiency 10/04/2018  . Obesity, unspecified 05/17/2013  . HTN (hypertension) 03/23/2013  . HLD (hyperlipidemia) 03/23/2013  . OA (osteoarthritis) 03/23/2013    Social History   Tobacco Use  . Smoking status: Never Smoker  . Smokeless tobacco: Never Used  Substance Use Topics  . Alcohol use: Yes    Comment: rare    Current Outpatient Medications:  .  amLODipine (NORVASC) 5 MG tablet, TAKE 1 TABLET EVERY DAY, Disp: 90 tablet, Rfl: 2 .  aspirin EC 81 MG tablet, Take 81 mg by mouth daily., Disp: , Rfl:  .  Cyanocobalamin (VITAMIN B-12 PO), Place 1 tablet under the tongue daily. , Disp: , Rfl:  .  Flaxseed Oil (LINSEED OIL) OIL, by Does not apply route., Disp: , Rfl:  .  hydrochlorothiazide (HYDRODIURIL) 25 MG tablet, TAKE 1 TABLET EVERY DAY, Disp: 90 tablet, Rfl: 2 .  Multiple Vitamin (MULTIVITAMIN) tablet, Take 1 tablet by mouth 2 (two) times daily., Disp: , Rfl:  .  Omega-3 Fatty Acids (FISH OIL) 1000 MG CAPS, Take 1,000 mg by mouth 2 (two) times daily. , Disp: , Rfl:  .  pravastatin (PRAVACHOL) 40 MG tablet, TAKE 1 TABLET EVERY DAY, Disp: 90 tablet, Rfl: 2 .  gabapentin (NEURONTIN) 300 MG capsule, Take 1 capsule (300 mg total) by mouth at bedtime., Disp: 90  capsule, Rfl: 3 .  SHINGRIX injection, , Disp: , Rfl:   Allergies  Allergen Reactions  . Demerol [Meperidine] Other (See Comments)    Makes her "looney"    Objective:  Temp 97.6 F (36.4 C) (Temporal)   Ht 5' 3.5" (1.613 m)   Wt 180 lb (81.6 kg)   BMI 31.39 kg/m   VITALS: Per patient if applicable, see vitals. GENERAL: Alert, appears well and in no acute distress. HEENT: Atraumatic, conjunctiva clear, no obvious abnormalities on inspection of external nose and ears. NECK: Normal movements of the head and neck. CARDIOPULMONARY: No increased WOB. Speaking in clear sentences. I:E ratio WNL.  MS: Moves all visible extremities without noticeable abnormality. PSYCH: Pleasant and cooperative, well-groomed. Speech normal rate and rhythm. Affect is appropriate. Insight and judgement are appropriate. Attention is focused, linear, and appropriate.  NEURO: CN grossly intact. Oriented as arrived to appointment on time with no prompting. Moves both UE equally.  SKIN: No obvious lesions, wounds, erythema, or cyanosis noted on face or hands.  Depression screen [facility] 2/9 04/20/2018 07/19/2017 07/09/2016  Decreased Interest 0 0 0  Down, Depressed, Hopeless 0 0 0  PHQ - 2 Score 0 0 0     . COVID-19 Education: The signs and symptoms of COVID-19 were discussed with the patient and how to seek care for testing if needed. The importance of social distancing was discussed today. . Reviewed expectations re: course of current medical issues. . Discussed self-management of symptoms. . Outlined signs and symptoms indicating need for more acute intervention. . Patient verbalized understanding and all questions were answered. Marland Kitchen Health Maintenance issues including appropriate healthy diet, exercise, and smoking avoidance were discussed with patient. . See orders for this visit as documented in the electronic medical record.  Arnette Norris, MD  Records requested if needed. Time spent: 25 minutes, of which >50%  was spent in obtaining information about her symptoms, reviewing her previous labs, evaluations, and treatments, counseling her about her condition (please see the discussed topics above), and developing a plan to further investigate it; she had a number of questions which I addressed.   Lab Results  Component Value Date   WBC 7.1 10/05/2018   HGB 14.9 10/05/2018   HCT 44.0 10/05/2018   PLT 267.0 10/05/2018   GLUCOSE 100 (H) 10/05/2018   CHOL 185 10/05/2018   TRIG 91.0 10/05/2018   HDL 57.60 10/05/2018   LDLDIRECT 127.3 03/23/2013   LDLCALC 109 (H) 10/05/2018   ALT 19 10/05/2018   AST 22 10/05/2018   NA 138 10/05/2018   K 3.8 10/05/2018   CL 101 10/05/2018   CREATININE 1.06 10/05/2018   BUN 21 10/05/2018   CO2 27 10/05/2018   TSH 1.15 10/05/2018    Lab Results  Component Value Date   TSH 1.15 10/05/2018   Lab Results  Component Value Date   WBC 7.1 10/05/2018   HGB 14.9 10/05/2018   HCT 44.0 10/05/2018   MCV 86.3 10/05/2018   PLT 267.0 10/05/2018   Lab Results  Component Value Date   NA 138 10/05/2018   K 3.8 10/05/2018   CO2 27 10/05/2018  GLUCOSE 100 (H) 10/05/2018   BUN 21 10/05/2018   CREATININE 1.06 10/05/2018   BILITOT 0.6 10/05/2018   ALKPHOS 67 10/05/2018   AST 22 10/05/2018   ALT 19 10/05/2018   PROT 7.4 10/05/2018   ALBUMIN 4.4 10/05/2018   CALCIUM 10.1 10/05/2018   GFR 50.55 (L) 10/05/2018   Lab Results  Component Value Date   CHOL 185 10/05/2018   Lab Results  Component Value Date   HDL 57.60 10/05/2018   Lab Results  Component Value Date   LDLCALC 109 (H) 10/05/2018   Lab Results  Component Value Date   TRIG 91.0 10/05/2018   Lab Results  Component Value Date   CHOLHDL 3 10/05/2018   No results found for: HGBA1C     Assessment & Plan:   Problem List Items Addressed This Visit      Active Problems   Peripheral neuropathy    Bilateral hand and foot tingling- she questions if it is neuropathy.  Seems consistent with  neuropathy.  Has been going on for a long time- 8- 10 years.   Started to become more tingly for past two months.   Wearing splints at night were helping with the fingers tingling but not helping as much now. Assessment:    Peripheral Neuropathy    Plan:    Differential diagnosis explained to patient. All questions answered. Blood glucose. Renal function tests. Liver function tests. Thyroid function tests. Vitamin B12   CBC with diff TIBC  Start gabapentin 300 mg qhs.  Call or send my chart message prn if these symptoms worsen or fail to improve as anticipated. The patient indicates understanding of these issues and agrees with the plan.       Relevant Medications   gabapentin (NEURONTIN) 300 MG capsule   Other Relevant Orders   B12   Vitamin D (25 hydroxy)   CBC with Differential/Platelet   Iron, TIBC and Ferritin Panel   TSH   Hemoglobin A1c      I have discontinued Monica Lindsey's gabapentin. I am also having her start on gabapentin. Additionally, I am having her maintain her Fish Oil, aspirin EC, Cyanocobalamin (VITAMIN B-12 PO), multivitamin, Linseed Oil, amLODipine, hydrochlorothiazide, pravastatin, and Shingrix.  Meds ordered this encounter  Medications  . gabapentin (NEURONTIN) 300 MG capsule    Sig: Take 1 capsule (300 mg total) by mouth at bedtime.    Dispense:  90 capsule    Refill:  3     Arnette Norris, MD

## 2019-02-08 ENCOUNTER — Encounter: Payer: Self-pay | Admitting: Family Medicine

## 2019-02-08 ENCOUNTER — Telehealth (INDEPENDENT_AMBULATORY_CARE_PROVIDER_SITE_OTHER): Payer: Medicare HMO | Admitting: Family Medicine

## 2019-02-08 ENCOUNTER — Other Ambulatory Visit: Payer: Self-pay | Admitting: Family Medicine

## 2019-02-08 DIAGNOSIS — G609 Hereditary and idiopathic neuropathy, unspecified: Secondary | ICD-10-CM | POA: Diagnosis not present

## 2019-02-08 DIAGNOSIS — R5383 Other fatigue: Secondary | ICD-10-CM

## 2019-02-08 DIAGNOSIS — G629 Polyneuropathy, unspecified: Secondary | ICD-10-CM | POA: Insufficient documentation

## 2019-02-08 DIAGNOSIS — M791 Myalgia, unspecified site: Secondary | ICD-10-CM

## 2019-02-08 DIAGNOSIS — M25539 Pain in unspecified wrist: Secondary | ICD-10-CM

## 2019-02-08 MED ORDER — GABAPENTIN 300 MG PO CAPS: 300.0000 mg | ORAL_CAPSULE | Freq: Every day | ORAL | 3 refills | Status: DC

## 2019-02-08 NOTE — Patient Instructions (Addendum)
Sacroiliac Joint Dysfunction  Sacroiliac joint dysfunction is a condition that causes inflammation on one or both sides of the sacroiliac (SI) joint. The SI joint connects the lower part of the spine (sacrum) with the two upper portions of the pelvis (ilium). This condition causes deep aching or burning pain in the low back. In some cases, the pain may also spread into one or both buttocks, hips, or thighs. What are the causes? This condition may be caused by:  Pregnancy. During pregnancy, extra stress is put on the SI joints because the pelvis widens.  Injury, such as: ? Injuries from car accidents. ? Sports-related injuries. ? Work-related injuries. ? Sitting long periods of time  Having one leg that is shorter than the other.  Conditions that affect the joints, such as: ? Rheumatoid arthritis. ? Gout. ? Psoriatic arthritis. ? Joint infection (septic arthritis). Sometimes, the cause of SI joint dysfunction is not known. What are the signs or symptoms? Symptoms of this condition include:  Aching or burning pain in the lower back. The pain may also spread to other areas, such as: ? Buttocks. ? Groin. ? Thighs.  Muscle spasms in or around the painful areas.  Increased pain when standing, walking, running, stair climbing, bending, or lifting. How is this diagnosed? This condition is diagnosed with a physical exam and medical history. During the exam, the health care provider may move one or both of your legs to different positions to check for pain. Various tests may be done to confirm the diagnosis, including:  Imaging tests to look for other causes of pain. These may include: ? MRI. ? CT scan. ? Bone scan.  Diagnostic injection. A numbing medicine is injected into the SI joint using a needle. If your pain is temporarily improved or stopped after the injection, this can indicate that SI joint dysfunction is the problem. How is this treated? Treatment depends on the cause and  severity of your condition. Treatment options may include:  Ice or heat applied to the lower back area after an injury. This may help reduce pain and muscle spasms.  Medicines to relieve pain or inflammation or to relax the muscles.  Wearing a back brace (sacroiliac brace) to help support the joint while your back is healing.  Physical therapy to increase muscle strength around the joint and flexibility at the joint. This may also involve learning proper body positions and ways of moving to relieve stress on the joint.  Direct manipulation of the SI joint.  Injections of steroid medicine into the joint to reduce pain and swelling.  Radiofrequency ablation to burn away nerves that are carrying pain messages from the joint.  Use of a device that provides electrical stimulation to help reduce pain at the joint.  Surgery to put in screws and plates that limit or prevent joint motion. This is rare. Follow these instructions at home: Medicines  Take over-the-counter and prescription medicines only as told by your health care provider.  Do not drive or use heavy machinery while taking prescription pain medicine.  If you are taking prescription pain medicine, take actions to prevent or treat constipation. Your health care provider may recommend that you: ? Drink enough fluid to keep your urine pale yellow. ? Eat foods that are high in fiber, such as fresh fruits and vegetables, whole grains, and beans. ? Limit foods that are high in fat and processed sugars, such as fried or sweet foods. ? Take an over-the-counter or prescription medicine for constipation.  If you have a brace:  Wear the brace as told by your health care provider. Remove it only as told by your health care provider.  Keep the brace clean.  If the brace is not waterproof: ? Do not let it get wet. ? Cover it with a watertight covering when you take a bath or a shower. Managing pain, stiffness, and swelling       Icing can help with pain and swelling. Heat may help with muscle tension or spasms. Ask your health care provider if you should use ice or heat.  If directed, put ice on the affected area: ? If you have a removable brace, remove it as told by your health care provider. ? Put ice in a plastic bag. ? Place a towel between your skin and the bag. ? Leave the ice on for 20 minutes, 2-3 times a day.  If directed, apply heat to the affected area. Use the heat source that your health care provider recommends, such as a moist heat pack or a heating pad. ? Place a towel between your skin and the heat source. ? Leave the heat on for 20-30 minutes. ? Remove the heat if your skin turns bright red. This is especially important if you are unable to feel pain, heat, or cold. You may have a greater risk of getting burned. General instructions  Rest as needed. Ask your health care provider what activities are safe for you.  Return to your normal activities as told by your health care provider.  Exercise as directed by your health care provider or physical therapist.  Do not use any products that contain nicotine or tobacco, such as cigarettes and e-cigarettes. These can delay bone healing. If you need help quitting, ask your health care provider.  Keep all follow-up visits as told by your health care provider. This is important. Contact a health care provider if:  Your pain is not controlled with medicine.  You have a fever.  Your pain is getting worse. Get help right away if:  You have weakness, numbness, or tingling in your legs or feet.  You lose control of your bladder or bowel. Summary  Sacroiliac joint dysfunction is a condition that causes inflammation on one or both sides of the sacroiliac (SI) joint.  This condition causes deep aching or burning pain in the low back. In some cases, the pain may also spread into one or both buttocks, hips, or thighs.  Treatment depends on the  cause and severity of your condition. It may include medicines to reduce pain and swelling or to relax muscles. This information is not intended to replace advice given to you by your health care provider. Make sure you discuss any questions you have with your health care provider. Document Revised: 09/15/2017 Document Reviewed: 03/01/2017 Elsevier Patient Education  2020 [employer].

## 2019-02-08 NOTE — Assessment & Plan Note (Signed)
Bilateral hand and foot tingling- she questions if it is neuropathy.  Seems consistent with neuropathy.  Has been going on for a long time- 8- 10 years.   Started to become more tingly for past two months.   Wearing splints at night were helping with the fingers tingling but not helping as much now. Assessment:    Peripheral Neuropathy    Plan:    Differential diagnosis explained to patient. All questions answered. Blood glucose. Renal function tests. Liver function tests. Thyroid function tests. Vitamin B12   CBC with diff TIBC  Start gabapentin 300 mg qhs.  Call or send my chart message prn if these symptoms worsen or fail to improve as anticipated. The patient indicates understanding of these issues and agrees with the plan.

## 2019-02-10 ENCOUNTER — Other Ambulatory Visit: Payer: Medicare HMO

## 2019-02-13 ENCOUNTER — Other Ambulatory Visit (INDEPENDENT_AMBULATORY_CARE_PROVIDER_SITE_OTHER): Payer: Medicare HMO

## 2019-02-13 ENCOUNTER — Other Ambulatory Visit: Payer: Self-pay

## 2019-02-13 DIAGNOSIS — R5383 Other fatigue: Secondary | ICD-10-CM | POA: Diagnosis not present

## 2019-02-13 DIAGNOSIS — M25539 Pain in unspecified wrist: Secondary | ICD-10-CM | POA: Diagnosis not present

## 2019-02-13 DIAGNOSIS — G609 Hereditary and idiopathic neuropathy, unspecified: Secondary | ICD-10-CM

## 2019-02-13 DIAGNOSIS — M791 Myalgia, unspecified site: Secondary | ICD-10-CM | POA: Diagnosis not present

## 2019-02-13 LAB — VITAMIN D 25 HYDROXY (VIT D DEFICIENCY, FRACTURES): VITD: 19.44 ng/mL — ABNORMAL LOW (ref 30.00–100.00)

## 2019-02-13 LAB — CBC WITH DIFFERENTIAL/PLATELET
Basophils Absolute: 0 10*3/uL (ref 0.0–0.1)
Basophils Relative: 0.9 % (ref 0.0–3.0)
Eosinophils Absolute: 0.1 10*3/uL (ref 0.0–0.7)
Eosinophils Relative: 2.1 % (ref 0.0–5.0)
HCT: 43.9 % (ref 36.0–46.0)
Hemoglobin: 14.6 g/dL (ref 12.0–15.0)
Lymphocytes Relative: 13.6 % (ref 12.0–46.0)
Lymphs Abs: 0.7 10*3/uL (ref 0.7–4.0)
MCHC: 33.2 g/dL (ref 30.0–36.0)
MCV: 87.7 fl (ref 78.0–100.0)
Monocytes Absolute: 0.7 10*3/uL (ref 0.1–1.0)
Monocytes Relative: 13.1 % — ABNORMAL HIGH (ref 3.0–12.0)
Neutro Abs: 3.6 10*3/uL (ref 1.4–7.7)
Neutrophils Relative %: 70.3 % (ref 43.0–77.0)
Platelets: 220 10*3/uL (ref 150.0–400.0)
RBC: 5.01 Mil/uL (ref 3.87–5.11)
RDW: 13.1 % (ref 11.5–15.5)
WBC: 5.1 10*3/uL (ref 4.0–10.5)

## 2019-02-13 LAB — HEMOGLOBIN A1C: Hgb A1c MFr Bld: 5.6 % (ref 4.6–6.5)

## 2019-02-13 LAB — VITAMIN B12: Vitamin B-12: 716 pg/mL (ref 211–911)

## 2019-02-13 LAB — TSH: TSH: 2.36 u[IU]/mL (ref 0.35–4.50)

## 2019-02-14 LAB — IRON,TIBC AND FERRITIN PANEL
%SAT: 18 % (calc) (ref 16–45)
Ferritin: 18 ng/mL (ref 16–288)
Iron: 63 ug/dL (ref 45–160)
TIBC: 357 mcg/dL (calc) (ref 250–450)

## 2019-02-15 ENCOUNTER — Telehealth: Payer: Self-pay | Admitting: Family Medicine

## 2019-02-15 ENCOUNTER — Telehealth: Payer: Self-pay

## 2019-02-15 DIAGNOSIS — E559 Vitamin D deficiency, unspecified: Secondary | ICD-10-CM

## 2019-02-15 MED ORDER — VITAMIN D (ERGOCALCIFEROL) 1.25 MG (50000 UNIT) PO CAPS: 50000.0000 [IU] | ORAL_CAPSULE | ORAL | 0 refills | Status: AC

## 2019-02-15 NOTE — Telephone Encounter (Signed)
Patient is calling back for lab results. CB [phone]

## 2019-02-15 NOTE — Telephone Encounter (Signed)
Pt aware of lab results/sent in Rx for Vit Dana Dorner 50k #12/future order for Vit-Dezmon Conover entered/thx dmf

## 2019-02-15 NOTE — Telephone Encounter (Signed)
-----   Message from Lucille Passy, MD sent at 02/15/2019  9:56 AM EST ----- Plase call pt- Labs look great but vitamin Artice Holohan is low-   Vitamin Maddex Garlitz is low which can make you very tired and achy.  Please call in Vit D3 50,000 units x 12 weeks - 1 tab po weekly x 12 weeks (number 12, no refills), then continue VitD 2000 IU daily.  Recheck in 8-12 weeks.  Blood counts, bit b12, thyroid function, blood sugar all look great.

## 2019-03-13 ENCOUNTER — Telehealth: Payer: Self-pay | Admitting: Family Medicine

## 2019-03-13 NOTE — Telephone Encounter (Signed)
Pt called because she forgot to mention about when you said you were going to decrease her blood pressure medication, she wanted to know if you were still going to do it. She also said she had some concerns about the gabapentin after she read the side effects and would like to talk to some one

## 2019-03-15 NOTE — Telephone Encounter (Signed)
I tried to call pt twice to inform/will try again later/thx dmf

## 2019-03-15 NOTE — Telephone Encounter (Signed)
TA-PI talked to pt/she states that you mentioned possibly decreasing her cholesterol med/plz advise/thx dmf

## 2019-03-15 NOTE — Telephone Encounter (Signed)
Yes we could either try having her cut her pravachol 40 mg in half or you can send in an rx for pravachol 20 mg daily to her pharmacy- #90 with no refills. Thanks!

## 2019-03-17 NOTE — Addendum Note (Signed)
Addended by: Marrion Coy on: 03/17/2019 11:24 AM   Modules accepted: Orders

## 2019-03-17 NOTE — Telephone Encounter (Signed)
Pt aware/thx dmf 

## 2019-04-25 ENCOUNTER — Other Ambulatory Visit: Payer: Self-pay

## 2019-04-26 ENCOUNTER — Other Ambulatory Visit (INDEPENDENT_AMBULATORY_CARE_PROVIDER_SITE_OTHER): Payer: Medicare HMO

## 2019-04-26 ENCOUNTER — Other Ambulatory Visit: Payer: Medicare HMO

## 2019-04-26 DIAGNOSIS — E559 Vitamin D deficiency, unspecified: Secondary | ICD-10-CM | POA: Diagnosis not present

## 2019-04-26 LAB — VITAMIN D 25 HYDROXY (VIT D DEFICIENCY, FRACTURES): VITD: 51.27 ng/mL (ref 30.00–100.00)

## 2019-05-09 ENCOUNTER — Other Ambulatory Visit: Payer: Self-pay | Admitting: General Practice

## 2019-05-09 MED ORDER — GABAPENTIN 300 MG PO CAPS: 300.0000 mg | ORAL_CAPSULE | Freq: Every day | ORAL | 2 refills | Status: DC

## 2019-05-09 NOTE — Telephone Encounter (Signed)
Patient is calling and requesting a refill for Gabapentin sent to [facility]. Informed patient due to Dr. Deborra Medina leaving the practice she needed to [facility] to another provider. Pls advise. CB is [phone]

## 2019-05-09 NOTE — Telephone Encounter (Signed)
Pt just want to transfer rx written from Dr. Deborra Medina in 02/2019 from [facility] to [facility] for right now (has 3 refills left).   [place] please advise, Marita Snellen to order under you? Dr.Aron is not in system anymore.

## 2019-06-18 IMAGING — MG MM DIGITAL SCREENING BILAT W/ TOMO W/ CAD
8 of 12 series · 8 of 28 positions shown · non-contrast
Comparison: Previous exam(s).

CLINICAL DATA: Screening.

EXAM:
2D DIGITAL SCREENING BILATERAL MAMMOGRAM WITH CAD AND ADJUNCT TOMO

[R CC]
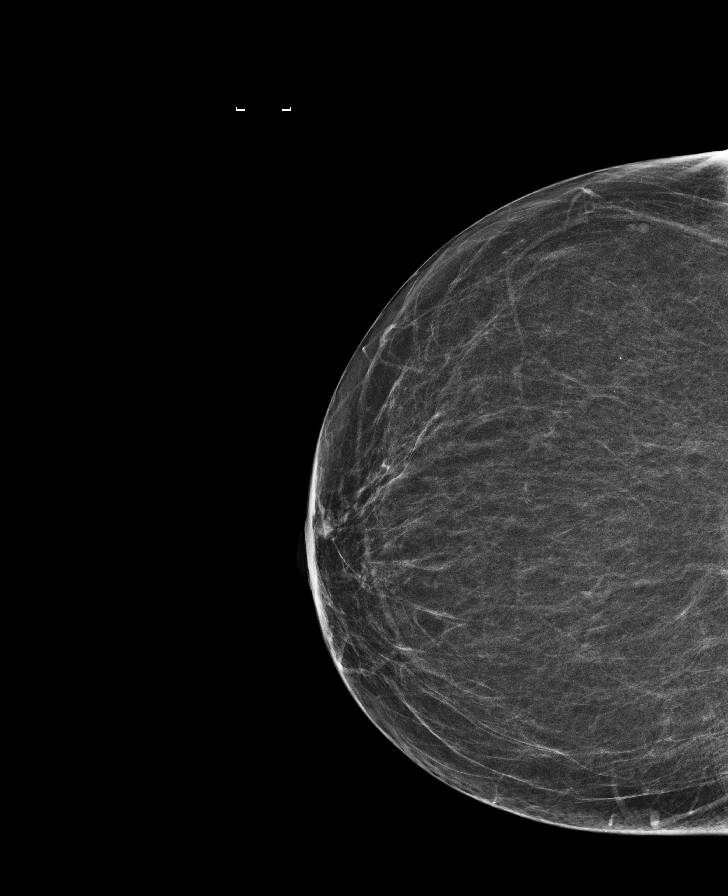

[L MLO synth-2D]
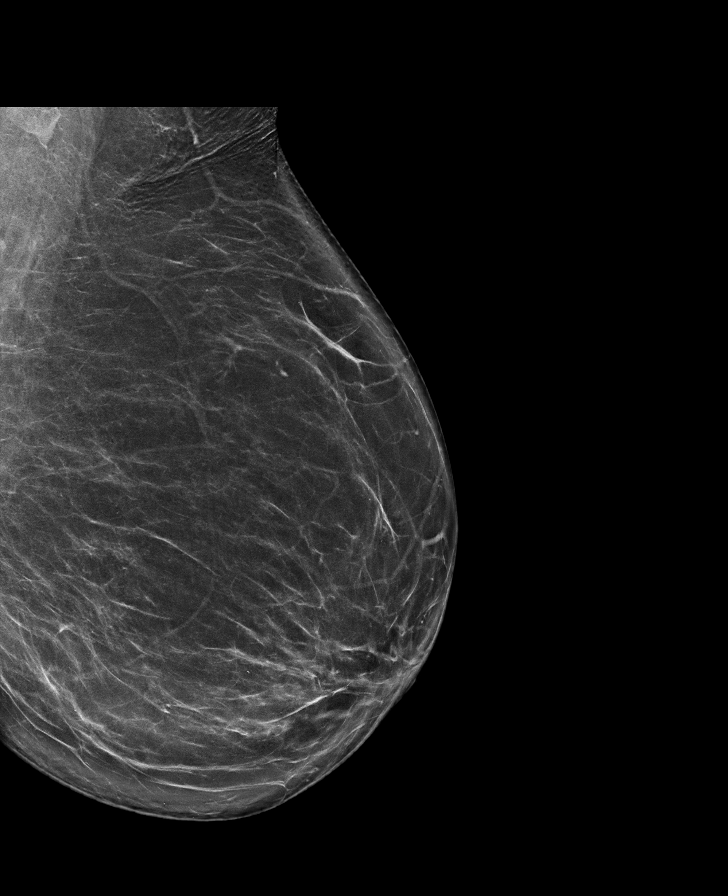

[R CC synth-2D]
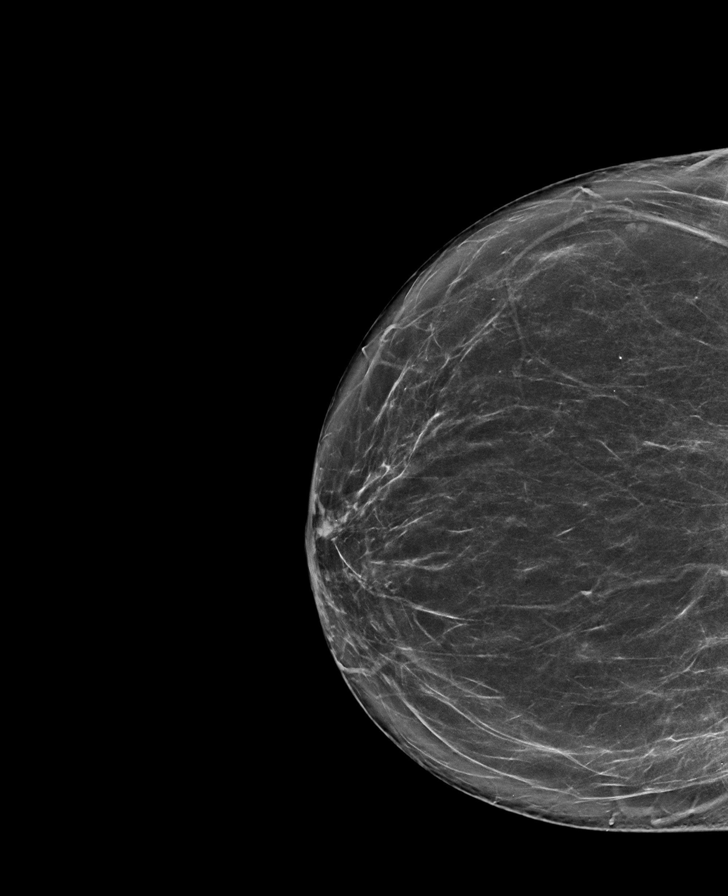

[R MLO synth-2D]
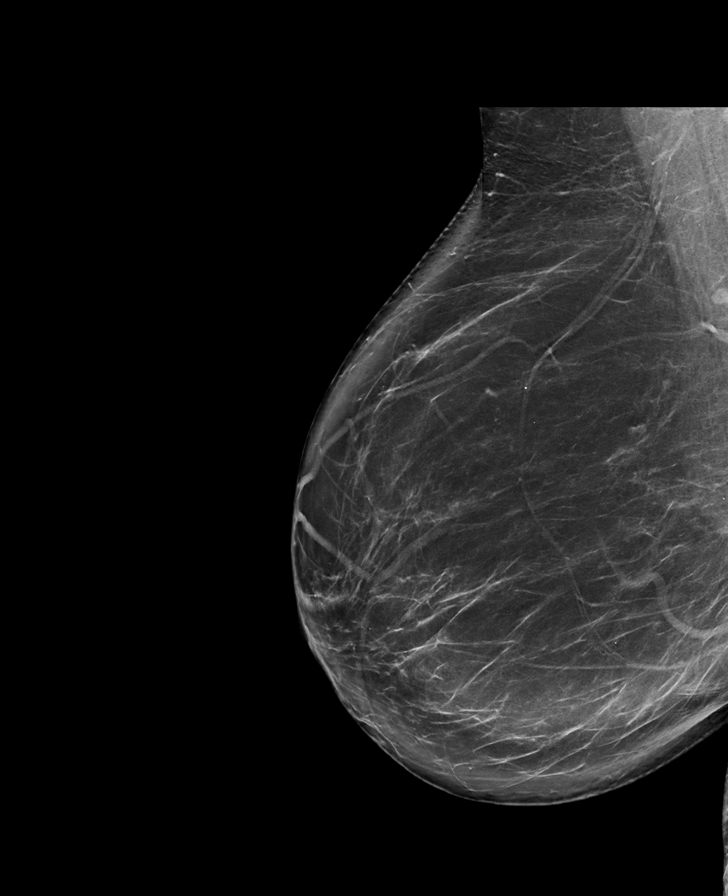

[R MLO]
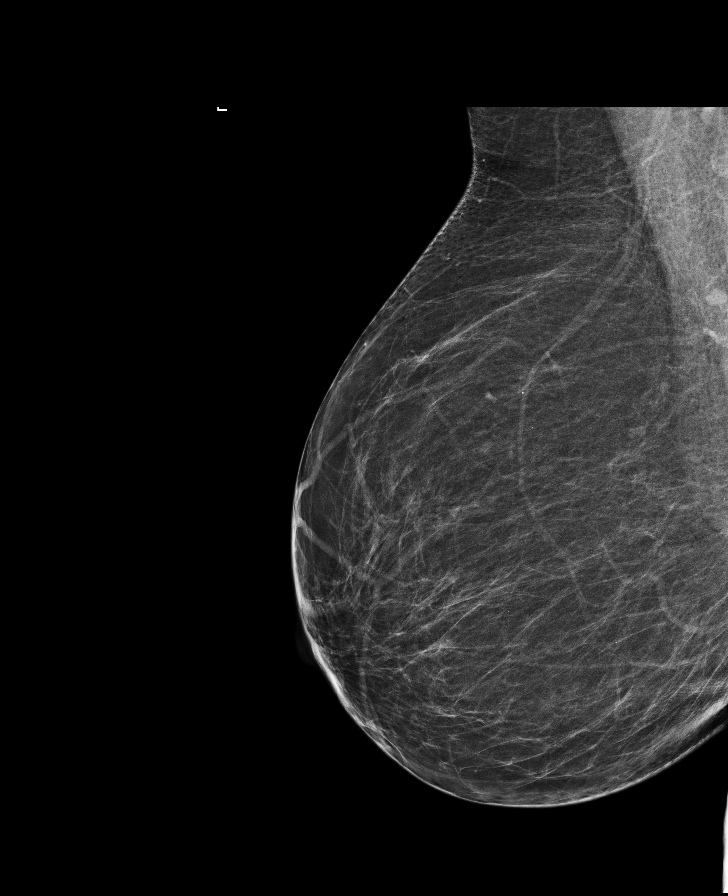

[L CC]
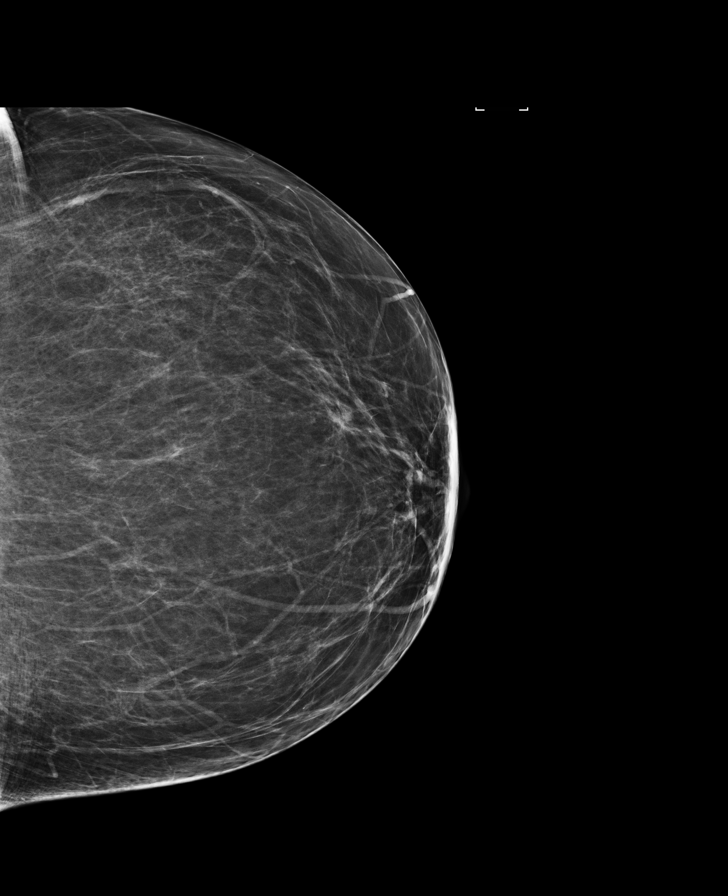

[L CC synth-2D]
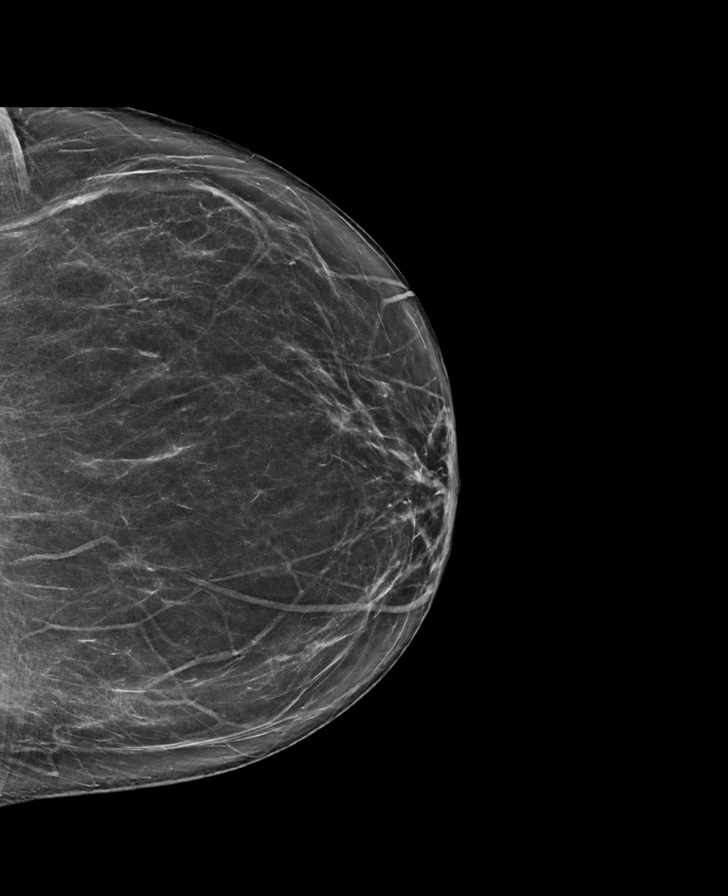

[L MLO]
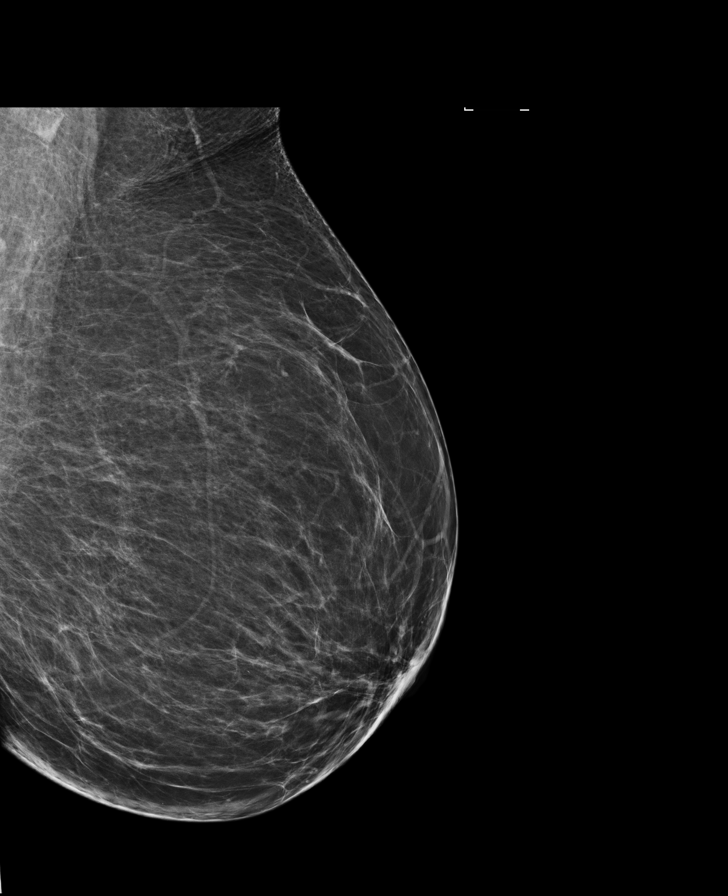

[8 of 28 positions shown; findings below may reference images not displayed]

ACR Breast Density Category b: There are scattered areas of
fibroglandular density.
FINDINGS: There are no findings suspicious for malignancy. Images were
processed with CAD.
IMPRESSION: No mammographic evidence of malignancy. A result letter of this
screening mammogram will be mailed directly to the patient.

RECOMMENDATION:
Screening mammogram in one year. (Code:97-6-RS4)

BI-RADS CATEGORY  1: Negative.

## 2019-06-27 ENCOUNTER — Telehealth: Payer: Self-pay | Admitting: General Practice

## 2019-06-27 DIAGNOSIS — G609 Hereditary and idiopathic neuropathy, unspecified: Secondary | ICD-10-CM

## 2019-06-27 DIAGNOSIS — I10 Essential (primary) hypertension: Secondary | ICD-10-CM

## 2019-06-27 DIAGNOSIS — E785 Hyperlipidemia, unspecified: Secondary | ICD-10-CM

## 2019-06-27 NOTE — Telephone Encounter (Signed)
Patient is calling and requesting a refill for hydrochlorothiazide, amlodipine and pravastatin sent to [facility]. Patient has a TOC appointment with Dr. Loletha Grayer on 9/24. Pls advise. [phone]

## 2019-06-27 NOTE — Telephone Encounter (Signed)
Monica Lindsey please advise in absence of Dr. Loletha Grayer. Pt is requesting refills:  Hydrochlorothiazide 90 tabs  2-refills Last filled- 09/16/18  Amlodipine 90 tabs    2-refills Last fill-09/16/18  Pravastatin 90 tabs  2-refills  Last office visit was 04/26/19  She has a TOC scheduled with Dr. Loletha Grayer 10/27/19. She states shes out and needs enough to cover till her appointment.

## 2019-06-28 NOTE — Telephone Encounter (Signed)
Send refill request to Dr. Loletha Grayer

## 2019-06-29 MED ORDER — PRAVASTATIN SODIUM 40 MG PO TABS: 40.0000 mg | ORAL_TABLET | Freq: Every day | ORAL | 0 refills | Status: DC

## 2019-06-29 MED ORDER — GABAPENTIN 300 MG PO CAPS: 300.0000 mg | ORAL_CAPSULE | Freq: Every day | ORAL | 0 refills | Status: DC

## 2019-06-29 MED ORDER — AMLODIPINE BESYLATE 5 MG PO TABS: 5.0000 mg | ORAL_TABLET | Freq: Every day | ORAL | 0 refills | Status: DC

## 2019-06-29 MED ORDER — HYDROCHLOROTHIAZIDE 25 MG PO TABS: 25.0000 mg | ORAL_TABLET | Freq: Every day | ORAL | 0 refills | Status: DC

## 2019-06-29 NOTE — Telephone Encounter (Signed)
Dr. Loletha Grayer please advise. Pt is requesting refills for:  Hydrochlorothiazide 90 tabs  2-refills Last filled- 09/16/18  Amlodipine 90 tabs    2-refills Last fill-09/16/18  Pravastatin 90 tabs  2-refills  Last office visit was 04/26/19  She has a TOC scheduled with Dr. Loletha Grayer 10/27/19. She states shes out and needs enough to cover till her appointment.

## 2019-06-29 NOTE — Telephone Encounter (Signed)
Needs sooner appt with Dr. Loletha Grayer for additional refills unless Dr. Loletha Grayer states otherwise.

## 2019-06-29 NOTE — Telephone Encounter (Signed)
Monica Lindsey please advise in absence of Dr. Loletha Grayer. Pt is requesting refills:  Hydrochlorothiazide 90 tabs  2-refills Last filled- 09/16/18  Amlodipine 90 tabs    2-refills Last fill-09/16/18  Pravastatin 90 tabs  2-refills  Last office visit was 04/26/19  She has a TOC scheduled with Dr. Loletha Grayer 10/27/19. She states shes out and needs enough to cover till her appointment.

## 2019-07-05 NOTE — Telephone Encounter (Signed)
Please have pt schedule VV with me to discuss med refills

## 2019-07-06 NOTE — Telephone Encounter (Signed)
Pt was notified and offered a VV with Dr. Loletha Grayer but she informed me that she did have some refills left and shes going on a trip next Monday till June 28th and that she would call when she got back an schedule an appointment for medication refills.

## 2019-08-03 ENCOUNTER — Telehealth: Payer: Self-pay | Admitting: General Practice

## 2019-08-03 ENCOUNTER — Other Ambulatory Visit: Payer: Self-pay

## 2019-08-03 DIAGNOSIS — I10 Essential (primary) hypertension: Secondary | ICD-10-CM

## 2019-08-03 MED ORDER — HYDROCHLOROTHIAZIDE 25 MG PO TABS: 25.0000 mg | ORAL_TABLET | Freq: Every day | ORAL | 0 refills | Status: DC

## 2019-08-03 MED ORDER — AMLODIPINE BESYLATE 5 MG PO TABS: 5.0000 mg | ORAL_TABLET | Freq: Every day | ORAL | 0 refills | Status: DC

## 2019-08-03 NOTE — Telephone Encounter (Signed)
done

## 2019-08-03 NOTE — Telephone Encounter (Signed)
Patient is calling and stated that she misplaced her medication for hydrochlorothiazide and amlodipine and is requesting a refill  Be sent to [facility] in [place]. Patient has an appointment to [facility] to Dr. Loletha Grayer on 9/24. CB is [phone]

## 2019-08-03 NOTE — Telephone Encounter (Signed)
Okay for you to give her enough to last until September.

## 2019-08-03 NOTE — Telephone Encounter (Signed)
Please advise message below patient will transfer care to Dr. Loletha Grayer in September her last OV with Dr. Deborra Medina was 03/13/19. Is it okay to refill missing medications?

## 2019-08-31 DIAGNOSIS — M7989 Other specified soft tissue disorders: Secondary | ICD-10-CM | POA: Diagnosis not present

## 2019-08-31 DIAGNOSIS — M25472 Effusion, left ankle: Secondary | ICD-10-CM | POA: Diagnosis not present

## 2019-09-14 DIAGNOSIS — M19072 Primary osteoarthritis, left ankle and foot: Secondary | ICD-10-CM | POA: Diagnosis not present

## 2019-09-18 ENCOUNTER — Telehealth: Payer: Self-pay | Admitting: General Practice

## 2019-09-18 DIAGNOSIS — E785 Hyperlipidemia, unspecified: Secondary | ICD-10-CM

## 2019-09-18 DIAGNOSIS — G609 Hereditary and idiopathic neuropathy, unspecified: Secondary | ICD-10-CM

## 2019-09-18 DIAGNOSIS — I10 Essential (primary) hypertension: Secondary | ICD-10-CM

## 2019-09-18 NOTE — Telephone Encounter (Signed)
Patient is requesting a refill for amlodipine, hydrochlorothiazide, provastatin and gabapentin sent to [facility]. Informed patient that Dr. Loletha Grayer is out of the office and request would be sent to the doctor of the day. Patient has a TOC appointment on 9/24 with Dr. Loletha Grayer. CB is [phone]

## 2019-09-20 NOTE — Telephone Encounter (Signed)
Ok to and please refill all requested meds for 90 day supply and 3 RF. Thank you

## 2019-09-20 NOTE — Telephone Encounter (Signed)
Please advise 

## 2019-09-21 MED ORDER — GABAPENTIN 300 MG PO CAPS: 300.0000 mg | ORAL_CAPSULE | Freq: Every day | ORAL | 3 refills | Status: DC

## 2019-09-21 MED ORDER — HYDROCHLOROTHIAZIDE 25 MG PO TABS: 25.0000 mg | ORAL_TABLET | Freq: Every day | ORAL | 3 refills | Status: DC

## 2019-09-21 MED ORDER — PRAVASTATIN SODIUM 40 MG PO TABS: 40.0000 mg | ORAL_TABLET | Freq: Every day | ORAL | 3 refills | Status: DC

## 2019-09-21 MED ORDER — AMLODIPINE BESYLATE 5 MG PO TABS: 5.0000 mg | ORAL_TABLET | Freq: Every day | ORAL | 3 refills | Status: DC

## 2019-09-21 NOTE — Telephone Encounter (Signed)
All meds filled and sent to [facility].

## 2019-09-29 ENCOUNTER — Telehealth: Payer: Self-pay | Admitting: General Practice

## 2019-09-29 NOTE — Telephone Encounter (Signed)
Patient is calling and requesting a refill for pravastatin sent to [facility]. Patient has a TOC appointment with Dr. Loletha Grayer on 9/24, please advise.

## 2019-09-29 NOTE — Telephone Encounter (Signed)
Pt was notified and verbally understood that we sent a RX for Pravastatin to [facility] 09/21/2019. Pt was told to check with [facility] but if she hasn't received it in the next few days to give [place] a call back.

## 2019-10-27 ENCOUNTER — Ambulatory Visit (INDEPENDENT_AMBULATORY_CARE_PROVIDER_SITE_OTHER): Payer: Medicare HMO | Admitting: Family Medicine

## 2019-10-27 ENCOUNTER — Encounter: Payer: Self-pay | Admitting: Family Medicine

## 2019-10-27 ENCOUNTER — Other Ambulatory Visit: Payer: Self-pay

## 2019-10-27 VITALS — BP 132/78 | HR 62 | Temp 97.1°F | Ht 63.0 in | Wt 186.4 lb

## 2019-10-27 DIAGNOSIS — E559 Vitamin D deficiency, unspecified: Secondary | ICD-10-CM | POA: Diagnosis not present

## 2019-10-27 DIAGNOSIS — Z1211 Encounter for screening for malignant neoplasm of colon: Secondary | ICD-10-CM

## 2019-10-27 DIAGNOSIS — E785 Hyperlipidemia, unspecified: Secondary | ICD-10-CM

## 2019-10-27 DIAGNOSIS — E538 Deficiency of other specified B group vitamins: Secondary | ICD-10-CM

## 2019-10-27 DIAGNOSIS — M1990 Unspecified osteoarthritis, unspecified site: Secondary | ICD-10-CM | POA: Diagnosis not present

## 2019-10-27 DIAGNOSIS — M8589 Other specified disorders of bone density and structure, multiple sites: Secondary | ICD-10-CM

## 2019-10-27 DIAGNOSIS — Z23 Encounter for immunization: Secondary | ICD-10-CM

## 2019-10-27 DIAGNOSIS — I10 Essential (primary) hypertension: Secondary | ICD-10-CM

## 2019-10-27 LAB — COMPREHENSIVE METABOLIC PANEL
ALT: 14 U/L (ref 0–35)
AST: 17 U/L (ref 0–37)
Albumin: 4.4 g/dL (ref 3.5–5.2)
Alkaline Phosphatase: 54 U/L (ref 39–117)
BUN: 23 mg/dL (ref 6–23)
CO2: 29 mEq/L (ref 19–32)
Calcium: 9.9 mg/dL (ref 8.4–10.5)
Chloride: 100 mEq/L (ref 96–112)
Creatinine, Ser: 1.05 mg/dL (ref 0.40–1.20)
GFR: 50.96 mL/min — ABNORMAL LOW (ref >60.00)
Glucose, Bld: 97 mg/dL (ref 70–99)
Potassium: 3.5 mEq/L (ref 3.5–5.1)
Sodium: 140 mEq/L (ref 135–145)
Total Bilirubin: 0.5 mg/dL (ref 0.2–1.2)
Total Protein: 6.8 g/dL (ref 6.0–8.3)

## 2019-10-27 LAB — CBC
HCT: 43.2 % (ref 36.0–46.0)
Hemoglobin: 14.7 g/dL (ref 12.0–15.0)
MCHC: 34 g/dL (ref 30.0–36.0)
MCV: 88.5 fl (ref 78.0–100.0)
Platelets: 221 10*3/uL (ref 150.0–400.0)
RBC: 4.88 Mil/uL (ref 3.87–5.11)
RDW: 13.6 % (ref 11.5–15.5)
WBC: 7 10*3/uL (ref 4.0–10.5)

## 2019-10-27 LAB — LIPID PANEL
Cholesterol: 181 mg/dL (ref 0–200)
HDL: 56.7 mg/dL (ref >39.00)
LDL Cholesterol: 103 mg/dL — ABNORMAL HIGH (ref 0–99)
NonHDL: 124.39
Total CHOL/HDL Ratio: 3
Triglycerides: 105 mg/dL (ref 0.0–149.0)
VLDL: 21 mg/dL (ref 0.0–40.0)

## 2019-10-27 LAB — VITAMIN D 25 HYDROXY (VIT D DEFICIENCY, FRACTURES): VITD: 56.9 ng/mL (ref 30.00–100.00)

## 2019-10-27 LAB — VITAMIN B12: Vitamin B-12: >1526 pg/mL — ABNORMAL HIGH (ref 211–911)

## 2019-10-27 NOTE — Progress Notes (Signed)
Monica Lindsey is a 74 y.o. female  Chief Complaint  Patient presents with  . Establish Care    CPE/labs.  fasting this am. no concerns. wants flu shot today    HPI: Monica Lindsey is a 74 y.o. female seen today for annual exam, fasting labs, and f/u on chronic medical issues including HTN, hyperlipidemia, Vit D and B12 deficiency, OA. She would like a flu vaccine today.   Last mammo: 11/2018 - pt to schedule Last Dexa: 11/2018 - T-score = -1.1 (osteopenia) Last colonoscopy: 07/2009 - Dr. Verdie Shire - normal, no polyps  Dental: due Vision: UTD  Med refills needed today? none  Past Medical History:  Diagnosis Date  . Arthritis   . Constipation   . Frequent headaches   . Hay fever   . Hyperlipidemia   . Hypertension     Past Surgical History:  Procedure Laterality Date  . ABDOMINAL HYSTERECTOMY    . BACK SURGERY  1983  . CARPOMETACARPAL ([place]) FUSION OF THUMB Right 04/07/2017   Procedure: CARPOMETACARPAL [facility]) FUSION OF THUMB;  Surgeon: Earnestine Leys, MD;  Location: [facility] ORS;  Service: Orthopedics;  Laterality: Right;  . HEMORRHOID SURGERY    . TOOTH EXTRACTION      Social History   Socioeconomic History  . Marital status: Widowed    Spouse name: Not on file  . Number of children: Not on file  . Years of education: Not on file  . Highest education level: Not on file  Occupational History  . Not on file  Tobacco Use  . Smoking status: Never Smoker  . Smokeless tobacco: Never Used  Vaping Use  . Vaping Use: Never used  Substance and Sexual Activity  . Alcohol use: Yes    Comment: rare  . Drug use: No  . Sexual activity: Never  Other Topics Concern  . 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   Widowed.      Still working 3 days per week.  Very close to her children and grand children.      Daughter, [name], is HPOA.  Has a living will.   Would desire CPR, would not desire prolonged life support if  futile.            Social Determinants of Health   Financial Resource Strain:   . Difficulty of Paying Living Expenses: Not on file  Food Insecurity:   . Worried About Charity fundraiser in the Last Year: Not on file  . Ran Out of Food in the Last Year: Not on file  Transportation Needs:   . Lack of Transportation (Medical): Not on file  . Lack of Transportation (Non-Medical): Not on file  Physical Activity:   . Days of Exercise per Week: Not on file  . Minutes of Exercise per Session: Not on file  Stress:   . Feeling of Stress : Not on file  Social Connections:   . Frequency of Communication with Friends and Family: Not on file  . Frequency of Social Gatherings with Friends and Family: Not on file  . Attends Religious Services: Not on file  . Active Member of Clubs or Organizations: Not on file  . Attends Archivist Meetings: Not on file  . Marital Status: Not on file  Intimate Partner Violence:   . Fear of Current or Ex-Partner: Not on file  . Emotionally Abused: Not on file  .  Physically Abused: Not on file  . Sexually Abused: Not on file    Family History  Problem Relation Age of Onset  . Heart disease Mother   . Alcohol abuse Father   . Arthritis Father   . Heart disease Father   . Stroke Father   . Hypertension Father   . Breast cancer Paternal Aunt 6  . Breast cancer Cousin      Immunization History  Administered Date(s) Administered  . DTaP 10/27/2007  . Fluad Quad(high Dose 65+) 10/05/2018  . Influenza, Seasonal, Injecte, Preservative Fre 03/06/2009, 02/10/2011, 11/24/2011  . Influenza,inj,Quad PF,6+ Mos 12/06/2013, 10/10/2014, 11/27/2015, 12/01/2016  . Influenza,inj,quad, With Preservative 11/02/2017  . PFIZER SARS-COV-2 Vaccination 02/09/2019, 03/02/2019  . Pneumococcal Conjugate-13 05/17/2013  . Pneumococcal-Unspecified 03/06/2009  . Tdap 10/27/2007  . Zoster 10/27/2007  . Zoster Recombinat (Shingrix) 10/20/2018    Outpatient  Encounter Medications as of 10/27/2019  Medication Sig  . amLODipine (NORVASC) 5 MG tablet Take 1 tablet (5 mg total) by mouth daily. Needs appt with new pcp for additional refills  . aspirin EC 81 MG tablet Take 81 mg by mouth daily.  . Cyanocobalamin (VITAMIN B-12 PO) Place 1 tablet under the tongue daily.   . Flaxseed Oil (LINSEED OIL) OIL by Does not apply route.  . gabapentin (NEURONTIN) 300 MG capsule Take 1 capsule (300 mg total) by mouth at bedtime. Need Establish with new pcp  . hydrochlorothiazide (HYDRODIURIL) 25 MG tablet Take 1 tablet (25 mg total) by mouth daily. Needs appt with new pcp for additional refills  . Multiple Vitamin (MULTIVITAMIN) tablet Take 1 tablet by mouth 2 (two) times daily.  . Omega-3 Fatty Acids (FISH OIL) 1000 MG CAPS Take 1,000 mg by mouth 2 (two) times daily.   . pravastatin (PRAVACHOL) 40 MG tablet Take 1 tablet (40 mg total) by mouth daily. Needs appt with new pcp for additional refills  . SHINGRIX injection  (Patient not taking: Reported on 10/27/2019)   No facility-administered encounter medications on file as of 10/27/2019.     ROS: Gen: no fever, chills  Skin: no rash, itching ENT: no ear pain, ear drainage, nasal congestion, rhinorrhea, sinus pressure, sore throat Eyes: no blurry vision, double vision Resp: no cough, wheeze,SOB CV: no CP, palpitations, LE edema GI: no heartburn, n/v/d/c, abd pain GU: no dysuria, urgency, frequency, hematuria MSK: pt has OA so + joint pains Neuro: no dizziness, headache, weakness Psych: no depression, anxiety, insomnia   Allergies  Allergen Reactions  . Demerol [Meperidine] Other (See Comments)    Makes her "looney"    BP 132/78   Pulse 62   Temp (!) 97.1 F (36.2 C) (Temporal)   Ht 5\' 3"  (1.6 m)   Wt 186 lb 6.4 oz (84.6 kg)   SpO2 98%   BMI 33.02 kg/m   Physical Exam Constitutional:      General: She is not in acute distress.    Appearance: She is well-developed.  HENT:     Head:  Normocephalic and atraumatic.     Right Ear: Tympanic membrane and ear canal normal.     Left Ear: Tympanic membrane and ear canal normal.     Nose: Nose normal.  Eyes:     Conjunctiva/sclera: Conjunctivae normal.     Pupils: Pupils are equal, round, and reactive to light.  Neck:     Thyroid: No thyromegaly.  Cardiovascular:     Rate and Rhythm: Normal rate and regular rhythm.     Heart sounds:  Normal heart sounds. No murmur heard.   Pulmonary:     Effort: Pulmonary effort is normal. No respiratory distress.     Breath sounds: Normal breath sounds. No wheezing or rhonchi.  Abdominal:     General: Bowel sounds are normal. There is no distension.     Palpations: Abdomen is soft. There is no mass.     Tenderness: There is no abdominal tenderness.  Musculoskeletal:     Cervical back: Neck supple.     Right lower leg: No edema.     Left lower leg: No edema.  Lymphadenopathy:     Cervical: No cervical adenopathy.  Skin:    General: Skin is warm and dry.  Neurological:     Mental Status: She is alert and oriented to person, place, and time.     Motor: No abnormal muscle tone.     Coordination: Coordination normal.  Psychiatric:        Behavior: Behavior normal.      A/P:  1. Hyperlipidemia, unspecified hyperlipidemia type - cont on pravachole 40mg  daily - Lipid panel  2. Vitamin D deficiency - recommend daily Vit D 1000-2000IU daily - VITAMIN D 25 Hydroxy (Vit-D Deficiency, Fractures)  3. B12 deficiency - takes oral daily B12 supplement - Vitamin B12  4. Essential hypertension - controlled, at goal - cont norvasc 5mg  daily, HCTZ 25mg  daily - Comprehensive metabolic panel - CBC  5. Osteoarthritis, unspecified osteoarthritis type, unspecified site - stable, controlled with OTC meds  6. Osteopenia of multiple sites - last Dexa in 11/2018 - T-score = -1.1 - cont calcium and Vit D supplement    This visit occurred during the SARS-CoV-2 public health emergency.   Safety protocols were in place, including screening questions prior to the visit, additional usage of staff PPE, and extensive cleaning of exam room while observing appropriate contact time as indicated for disinfecting solutions.

## 2019-10-27 NOTE — Addendum Note (Signed)
Addended by: Lucila Maine on: 10/27/2019 08:59 AM   Modules accepted: Orders

## 2019-11-01 ENCOUNTER — Other Ambulatory Visit: Payer: Self-pay | Admitting: Family Medicine

## 2019-11-01 DIAGNOSIS — Z1231 Encounter for screening mammogram for malignant neoplasm of breast: Secondary | ICD-10-CM

## 2019-11-06 ENCOUNTER — Telehealth: Payer: Self-pay | Admitting: Family Medicine

## 2019-11-06 NOTE — Telephone Encounter (Signed)
Attempted to schedule AWV. Unable to LVM.  Will try at later time. Call both numbers

## 2019-12-05 ENCOUNTER — Other Ambulatory Visit: Payer: Self-pay

## 2019-12-05 ENCOUNTER — Ambulatory Visit
Admission: RE | Admit: 2019-12-05 | Discharge: 2019-12-05 | Disposition: A | Discharge status: Home / Self Care | Payer: Medicare HMO | Source: Ambulatory Visit | Attending: Family Medicine | Admitting: Family Medicine

## 2019-12-05 DIAGNOSIS — Z1231 Encounter for screening mammogram for malignant neoplasm of breast: Secondary | ICD-10-CM

## 2020-02-26 DIAGNOSIS — M25572 Pain in left ankle and joints of left foot: Secondary | ICD-10-CM | POA: Diagnosis not present

## 2020-03-25 NOTE — Progress Notes (Unsigned)
Subjective:   Monica Lindsey is a 75 y.o. female who presents for Medicare Annual (Subsequent) preventive examination.  I connected with Jaielle today by telephone and verified that I am speaking with the correct person using two identifiers. Location patient: home Location provider: work Persons participating in the virtual visit: patient, Marine scientist.    I discussed the limitations, risks, security and privacy concerns of performing an evaluation and management service by telephone and the availability of in person appointments. I also discussed with the patient that there may be a patient responsible charge related to this service. The patient expressed understanding and verbally consented to this telephonic visit.    Interactive audio and video telecommunications were attempted between this provider and patient, however failed, due to patient having technical difficulties OR patient did not have access to video capability.  We continued and completed visit with audio only.  Some vital signs may be absent or patient reported.   Time Spent with patient on telephone encounter: 40 minutes   Review of Systems    Cardiac Risk Factors include: advanced age (>76men, >47 women);hypertension;dyslipidemia;obesity (BMI >30kg/m2);sedentary lifestyle     Objective:    Today's Vitals   03/26/20 1031  Weight: 186 lb (84.4 kg)  Height: 5\' 3"  (1.6 m)  PainSc: 3    Body mass index is 32.95 kg/m.  Advanced Directives 03/26/2020 04/20/2018 03/31/2017 07/09/2016 05/23/2013 05/23/2013  Does Patient Have a Medical Advance Directive? Yes Yes Yes Yes Patient has advance directive, copy not in chart Patient does not have advance directive  Type of Advance Directive [employer];Living will [place];Living will [place];Living will [place];Living will [place];Living will -  Does patient want to make changes to  medical advance directive? - No - Patient declined - - No change requested -  Copy of [place] in Chart? No - copy requested No - copy requested - No - copy requested - -  Pre-existing out of facility DNR order (yellow form or pink MOST form) - - - - No No    Current Medications (verified) Outpatient Encounter Medications as of 03/26/2020  Medication Sig  . amLODipine (NORVASC) 5 MG tablet Take 1 tablet (5 mg total) by mouth daily. Needs appt with new pcp for additional refills  . aspirin EC 81 MG tablet Take 81 mg by mouth daily.  . Cyanocobalamin (VITAMIN B-12 PO) Place 1 tablet under the tongue daily.   . Flaxseed Oil (LINSEED OIL) OIL by Does not apply route.  . hydrochlorothiazide (HYDRODIURIL) 25 MG tablet Take 1 tablet (25 mg total) by mouth daily. Needs appt with new pcp for additional refills  . Multiple Vitamin (MULTIVITAMIN) tablet Take 1 tablet by mouth 2 (two) times daily.  . Omega-3 Fatty Acids (FISH OIL) 1000 MG CAPS Take 1,000 mg by mouth 2 (two) times daily.   . pravastatin (PRAVACHOL) 40 MG tablet Take 1 tablet (40 mg total) by mouth daily. Needs appt with new pcp for additional refills  . gabapentin (NEURONTIN) 300 MG capsule Take 1 capsule (300 mg total) by mouth at bedtime. Need Establish with new pcp (Patient not taking: Reported on 03/26/2020)  . [DISCONTINUED] SHINGRIX injection  (Patient not taking: Reported on 10/27/2019)   No facility-administered encounter medications on file as of 03/26/2020.    Allergies (verified) Demerol [meperidine]   History: Past Medical History:  Diagnosis Date  . Arthritis   . Constipation   .  Frequent headaches   . Hay fever   . Hyperlipidemia   . Hypertension    Past Surgical History:  Procedure Laterality Date  . ABDOMINAL HYSTERECTOMY    . BACK SURGERY  1983  . CARPOMETACARPAL ([place]) FUSION OF THUMB Right 04/07/2017   Procedure: CARPOMETACARPAL [facility]) FUSION OF THUMB;  Surgeon: Earnestine Leys, MD;  Location:  [facility] ORS;  Service: Orthopedics;  Laterality: Right;  . HEMORRHOID SURGERY    . TOOTH EXTRACTION     Family History  Problem Relation Age of Onset  . Heart disease Mother   . Alcohol abuse Father   . Arthritis Father   . Heart disease Father   . Stroke Father   . Hypertension Father   . Breast cancer Paternal Aunt 23  . Breast cancer Cousin    Social History   Socioeconomic History  . Marital status: Widowed    Spouse name: Not on file  . Number of children: Not on file  . Years of education: Not on file  . Highest education level: Not on file  Occupational History  . Not on file  Tobacco Use  . Smoking status: Never Smoker  . Smokeless tobacco: Never Used  Vaping Use  . Vaping Use: Never used  Substance and Sexual Activity  . Alcohol use: Yes    Comment: rare  . Drug use: No  . Sexual activity: Never  Other Topics Concern  . 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      Still working 3 days per week.  Very close to her children and grand children.      Daughter, [name], is HPOA.  Has a living will.   Would desire CPR, would not desire prolonged life support if futile.            Social Determinants of Health   Financial Resource Strain: Low Risk   . Difficulty of Paying Living Expenses: Not hard at all  Food Insecurity: No Food Insecurity  . Worried About Charity fundraiser in the Last Year: Never true  . Ran Out of Food in the Last Year: Never true  Transportation Needs: No Transportation Needs  . Lack of Transportation (Medical): No  . Lack of Transportation (Non-Medical): No  Physical Activity: Inactive  . Days of Exercise per Week: 0 days  . Minutes of Exercise per Session: 0 min  Stress: No Stress Concern Present  . Feeling of Stress : Not at all  Social Connections: Moderately Integrated  . Frequency of Communication with Friends and Family: More than three times a week  .  Frequency of Social Gatherings with Friends and Family: More than three times a week  . Attends Religious Services: 1 to 4 times per year  . Active Member of Clubs or Organizations: Yes  . Attends Archivist Meetings: 1 to 4 times per year  . Marital Status: Widowed    Tobacco Counseling Counseling given: Not Answered   Clinical Intake:  Pre-visit preparation completed: Yes  Pain : 0-10 Pain Score: 3  Pain Type: Chronic pain Pain Location: Shoulder Pain Onset: More than a month ago Pain Frequency: Constant     Nutritional Status: BMI > 30  Obese Nutritional Risks: None Diabetes: No  How often do you need to have someone help you when you read instructions, pamphlets, or other written materials from your doctor or pharmacy?: 1 - Never  Diabetic?No  Interpreter Needed?: No  Information entered by :: Caroleen Hamman LPN   Activities of Daily Living In your present state of health, do you have any difficulty performing the following activities: 03/26/2020  Hearing? N  Vision? N  Difficulty concentrating or making decisions? N  Walking or climbing stairs? N  Dressing or bathing? N  Doing errands, shopping? N  Preparing Food and eating ? N  Using the Toilet? N  In the past six months, have you accidently leaked urine? N  Do you have problems with loss of bowel control? N  Managing your Medications? N  Managing your Finances? N  Housekeeping or managing your Housekeeping? N  Some recent data might be hidden    Patient Care Team: Ronnald Nian, DO as PCP - General (Family Medicine) Oh, Lupita Dawn, MD (Inactive) as Physician Assistant (Internal Medicine) Barrie Dunker, MD as Counselor (Dermatology)  Indicate any recent Medical Services you may have received from other than [facility] providers in the past year (date may be approximate).     Assessment:   This is a routine wellness examination for [place].  Hearing/Vision screen  Hearing Screening    125Hz  250Hz  500Hz  1000Hz  2000Hz  3000Hz  4000Hz  6000Hz  8000Hz   Right ear:           Left ear:           Comments: No issues  Vision Screening Comments: Wears reading glasses Last eye exam- 2-3 years ago  Dietary issues and exercise activities discussed: Current Exercise Habits: The patient does not participate in regular exercise at present, Exercise limited by: None identified  Goals    . Increase physical activity    . Maintain a healthy diet      Depression Screen PHQ 2/9 Scores 03/26/2020 10/27/2019 04/20/2018 07/19/2017 07/09/2016 06/18/2015 06/11/2014  PHQ - 2 Score 0 1 0 0 0 0 1    Fall Risk Fall Risk  03/26/2020 10/27/2019 10/05/2018 04/20/2018 07/19/2017  Falls in the past year? 1 - 0 1 No  Comment - - - - -  Number falls in past yr: 1 1 - 0 -  Comment - - - - -  Injury with Fall? 0 0 - 1 -  Risk for fall due to : History of fall(s) - - Other (Comment) -  Risk for fall due to: Comment - - - tripped over something -  Follow up Falls prevention discussed - Falls evaluation completed - -    FALL RISK PREVENTION PERTAINING TO THE HOME:  Any stairs in or around the home? No  Home free of loose throw rugs in walkways, pet beds, electrical cords, etc? Yes  Adequate lighting in your home to reduce risk of falls? Yes   ASSISTIVE DEVICES UTILIZED TO PREVENT FALLS:  Life alert? No  Use of a cane, walker or w/c? No  Grab bars in the bathroom? Yes  Shower chair or bench in shower? No  Elevated toilet seat or a handicapped toilet? No   TIMED UP AND GO:  Was the test performed? No . Phone visit   Cognitive Function:Normal cognitive status assessed by  this Nurse Health Advisor. No abnormalities found.   MMSE - Mini Mental State Exam 04/20/2018 07/09/2016  Orientation to time 5 5  Orientation to Place 5 5  Registration 3 3  Attention/ Calculation 5 0  Recall 3 3  Language- name 2 objects 2 0  Language- repeat 1 1  Language- follow 3 step command 3 3  Language- read & follow  direction 1 0  Write a sentence 1 0  Copy design 1 0  Total score 30 20        Immunizations Immunization History  Administered Date(s) Administered  . DTaP 10/27/2007  . Fluad Quad(high Dose 65+) 10/05/2018, 10/27/2019  . Influenza, Seasonal, Injecte, Preservative Fre 03/06/2009, 02/10/2011, 11/24/2011  . Influenza,inj,Quad PF,6+ Mos 12/06/2013, 10/10/2014, 11/27/2015, 12/01/2016  . Influenza,inj,quad, With Preservative 11/02/2017  . PFIZER([place])SARS-COV-2 Vaccination 02/09/2019, 03/02/2019, 11/07/2019  . Pneumococcal Conjugate-13 05/17/2013  . Pneumococcal-Unspecified 03/06/2009  . Tdap 10/27/2007  . Zoster 10/27/2007  . Zoster Recombinat (Shingrix) 10/20/2018    TDAP status: Due, Education has been provided regarding the importance of this vaccine. Advised may receive this vaccine at local pharmacy or Health Dept. Aware to provide a copy of the vaccination record if obtained from local pharmacy or Health Dept. Verbalized acceptance and understanding.  Flu Vaccine status: Up to date  Pneumococcal vaccine status: Up to date  Covid-19 vaccine status: Completed vaccines  Qualifies for Shingles Vaccine? No   Zostavax completed Yes   Shingrix Completed?: Yes  Screening Tests Health Maintenance  Topic Date Due  . TETANUS/TDAP  07/10/2026 (Originally 10/26/2017)  . INFLUENZA VACCINE  Completed  . DEXA SCAN  Completed  . COVID-19 Vaccine  Completed  . Hepatitis C Screening  Completed  . PNA vac Low Risk Adult  Completed    Health Maintenance  There are no preventive care reminders to display for this patient.  Colorectal cancer screening: No longer required.   Mammogram status: Completed Bilateral-12/05/2019. Repeat every year  Bone Density status: Completed 12/01/2018. Results reflect: Bone density results: OSTEOPENIA. Repeat every 2 years.  Lung Cancer Screening: (Low Dose CT Chest recommended if Age 77-80 years, 30 pack-year currently smoking OR have quit  w/in 15years.) does not qualify.     Additional Screening:  Hepatitis C Screening:Completed 07/09/2016  Vision Screening: Recommended annual ophthalmology exams for early detection of glaucoma and other disorders of the eye. Is the patient up to date with their annual eye exam?  No  Who is the provider or what is the name of the office in which the patient attends annual eye exams? unsure Patient plans to make an appt soon  Dental Screening: Recommended annual dental exams for proper oral hygiene  Community Resource Referral / Chronic Care Management: CRR required this visit?  No   CCM required this visit?  No      Plan:     I have personally reviewed and noted the following in the patient's chart:   . Medical and social history . Use of alcohol, tobacco or illicit drugs  . Current medications and supplements . Functional ability and status . Nutritional status . Physical activity . Advanced directives . List of other physicians . Hospitalizations, surgeries, and ER visits in previous 12 months . Vitals . Screenings to include cognitive, depression, and falls . Referrals and appointments  In addition, I have reviewed and discussed with patient certain preventive protocols, quality metrics, and best practice recommendations. A written personalized care plan for preventive services as well as general preventive health recommendations were provided to patient.   Due to this being a telephonic visit, the after visit summary with patients personalized plan was offered to patient via mail or my-chart. Patient would like to access on my-chart.  Marta Antu, LPN   [id number]  Nurse health Advisor  Nurse Notes: None

## 2020-03-26 ENCOUNTER — Ambulatory Visit (INDEPENDENT_AMBULATORY_CARE_PROVIDER_SITE_OTHER): Payer: Medicare HMO

## 2020-03-26 VITALS — Ht 63.0 in | Wt 186.0 lb

## 2020-03-26 DIAGNOSIS — Z0000 Encounter for general adult medical examination without abnormal findings: Secondary | ICD-10-CM

## 2020-03-26 DIAGNOSIS — M7541 Impingement syndrome of right shoulder: Secondary | ICD-10-CM | POA: Diagnosis not present

## 2020-03-26 NOTE — Patient Instructions (Signed)
Monica Lindsey , Thank you for taking time to complete your Medicare Wellness Visit. I appreciate your ongoing commitment to your health goals. Please review the following plan we discussed and let me know if I can assist you in the future.   Screening recommendations/referrals: Colonoscopy: No longer required Mammogram: Completed 12/05/2019-Due 12/04/2020 Bone Density: Completed 12/01/2018-Due 11/30/2020 Recommended yearly ophthalmology/optometry visit for glaucoma screening and checkup Recommended yearly dental visit for hygiene and checkup  Vaccinations: Influenza vaccine: Up to date Pneumococcal vaccine: Completed vaccines Tdap vaccine: Discuss with pharmacy Shingles vaccine: Completed vaccines  Covid-19:Completed vaccines  Advanced directives: Please bring a copy for your chart  Conditions/risks identified: See problem list  Next appointment: Follow up in one year for your annual wellness visit    Preventive Care 77 Years and Older, Female Preventive care refers to lifestyle choices and visits with your health care provider that can promote health and wellness. What does preventive care include?  A yearly physical exam. This is also called an annual well check.  Dental exams once or twice a year.  Routine eye exams. Ask your health care provider how often you should have your eyes checked.  Personal lifestyle choices, including:  Daily care of your teeth and gums.  Regular physical activity.  Eating a healthy diet.  Avoiding tobacco and drug use.  Limiting alcohol use.  Practicing safe sex.  Taking low-dose aspirin every day.  Taking vitamin and mineral supplements as recommended by your health care provider. What happens during an annual well check? The services and screenings done by your health care provider during your annual well check will depend on your age, overall health, lifestyle risk factors, and family history of disease. Counseling  Your health care  provider may ask you questions about your:  Alcohol use.  Tobacco use.  Drug use.  Emotional well-being.  Home and relationship well-being.  Sexual activity.  Eating habits.  History of falls.  Memory and ability to understand (cognition).  Work and work Statistician.  Reproductive health. Screening  You may have the following tests or measurements:  Height, weight, and BMI.  Blood pressure.  Lipid and cholesterol levels. These may be checked every 5 years, or more frequently if you are over 15 years old.  Skin check.  Lung cancer screening. You may have this screening every year starting at age 77 if you have a 30-pack-year history of smoking and currently smoke or have quit within the past 15 years.  Fecal occult blood test (FOBT) of the stool. You may have this test every year starting at age 77.  Flexible sigmoidoscopy or colonoscopy. You may have a sigmoidoscopy every 5 years or a colonoscopy every 10 years starting at age 77.  Hepatitis C blood test.  Hepatitis B blood test.  Sexually transmitted disease (STD) testing.  Diabetes screening. This is done by checking your blood sugar (glucose) after you have not eaten for a while (fasting). You may have this done every 1-3 years.  Bone density scan. This is done to screen for osteoporosis. You may have this done starting at age 77.  Mammogram. This may be done every 1-2 years. Talk to your health care provider about how often you should have regular mammograms. Talk with your health care provider about your test results, treatment options, and if necessary, the need for more tests. Vaccines  Your health care provider may recommend certain vaccines, such as:  Influenza vaccine. This is recommended every year.  Tetanus, diphtheria, and acellular  pertussis (Tdap, Td) vaccine. You may need a Td booster every 10 years.  Zoster vaccine. You may need this after age 51.  Pneumococcal 13-valent conjugate (PCV13)  vaccine. One dose is recommended after age 77  Pneumococcal polysaccharide (PPSV23) vaccine. One dose is recommended after age 77. Talk to your health care provider about which screenings and vaccines you need and how often you need them. This information is not intended to replace advice given to you by your health care provider. Make sure you discuss any questions you have with your health care provider. Document Released: 02/15/2015 Document Revised: 10/09/2015 Document Reviewed: 11/20/2014 Elsevier Interactive Patient Education  2017 [employer] Prevention in the Home Falls can cause injuries. They can happen to people of all ages. There are many things you can do to make your home safe and to help prevent falls. What can I do on the outside of my home?  Regularly fix the edges of walkways and driveways and fix any cracks.  Remove anything that might make you trip as you walk through a door, such as a raised step or threshold.  Trim any bushes or trees on the path to your home.  Use bright outdoor lighting.  Clear any walking paths of anything that might make someone trip, such as rocks or tools.  Regularly check to see if handrails are loose or broken. Make sure that both sides of any steps have handrails.  Any raised decks and porches should have guardrails on the edges.  Have any leaves, snow, or ice cleared regularly.  Use sand or salt on walking paths during winter.  Clean up any spills in your garage right away. This includes oil or grease spills. What can I do in the bathroom?  Use night lights.  Install grab bars by the toilet and in the tub and shower. Do not use towel bars as grab bars.  Use non-skid mats or decals in the tub or shower.  If you need to sit down in the shower, use a plastic, non-slip stool.  Keep the floor dry. Clean up any water that spills on the floor as soon as it happens.  Remove soap buildup in the tub or shower  regularly.  Attach bath mats securely with double-sided non-slip rug tape.  Do not have throw rugs and other things on the floor that can make you trip. What can I do in the bedroom?  Use night lights.  Make sure that you have a light by your bed that is easy to reach.  Do not use any sheets or blankets that are too big for your bed. They should not hang down onto the floor.  Have a firm chair that has side arms. You can use this for support while you get dressed.  Do not have throw rugs and other things on the floor that can make you trip. What can I do in the kitchen?  Clean up any spills right away.  Avoid walking on wet floors.  Keep items that you use a lot in easy-to-reach places.  If you need to reach something above you, use a strong step stool that has a grab bar.  Keep electrical cords out of the way.  Do not use floor polish or wax that makes floors slippery. If you must use wax, use non-skid floor wax.  Do not have throw rugs and other things on the floor that can make you trip. What can I do with my stairs?  Do not leave any items on the stairs.  Make sure that there are handrails on both sides of the stairs and use them. Fix handrails that are broken or loose. Make sure that handrails are as long as the stairways.  Check any carpeting to make sure that it is firmly attached to the stairs. Fix any carpet that is loose or worn.  Avoid having throw rugs at the top or bottom of the stairs. If you do have throw rugs, attach them to the floor with carpet tape.  Make sure that you have a light switch at the top of the stairs and the bottom of the stairs. If you do not have them, ask someone to add them for you. What else can I do to help prevent falls?  Wear shoes that:  Do not have high heels.  Have rubber bottoms.  Are comfortable and fit you well.  Are closed at the toe. Do not wear sandals.  If you use a stepladder:  Make sure that it is fully  opened. Do not climb a closed stepladder.  Make sure that both sides of the stepladder are locked into place.  Ask someone to hold it for you, if possible.  Clearly mark and make sure that you can see:  Any grab bars or handrails.  First and last steps.  Where the edge of each step is.  Use tools that help you move around (mobility aids) if they are needed. These include:  Canes.  Walkers.  Scooters.  Crutches.  Turn on the lights when you go into a dark area. Replace any light bulbs as soon as they burn out.  Set up your furniture so you have a clear path. Avoid moving your furniture around.  If any of your floors are uneven, fix them.  If there are any pets around you, be aware of where they are.  Review your medicines with your doctor. Some medicines can make you feel dizzy. This can increase your chance of falling. Ask your doctor what other things that you can do to help prevent falls. This information is not intended to replace advice given to you by your health care provider. Make sure you discuss any questions you have with your health care provider. Document Released: 11/15/2008 Document Revised: 06/27/2015 Document Reviewed: 02/23/2014 Elsevier Interactive Patient Education  2017 [employer].

## 2020-06-17 ENCOUNTER — Other Ambulatory Visit: Payer: Self-pay

## 2020-06-17 ENCOUNTER — Encounter: Payer: Self-pay | Admitting: Family Medicine

## 2020-06-17 ENCOUNTER — Ambulatory Visit (INDEPENDENT_AMBULATORY_CARE_PROVIDER_SITE_OTHER): Payer: Medicare HMO | Admitting: Family Medicine

## 2020-06-17 VITALS — BP 124/76 | HR 84 | Temp 97.9°F | Ht 63.0 in | Wt 180.0 lb

## 2020-06-17 DIAGNOSIS — K5904 Chronic idiopathic constipation: Secondary | ICD-10-CM

## 2020-06-17 NOTE — Progress Notes (Signed)
[facility] [address] [place] [address] Dept: [phone] Dept Fax: [phone]  Office Visit  Subjective:    Patient ID: Monica Lindsey, female    DOB: [birth year], 75 y.o..   MRN: [id number]  Chief Complaint  Patient presents with  . Acute Visit    C/o having constipation over the weekend.  She states she hasn't ate anything solid x 1 week.  She has taken Rexal stool softener with little relief.      History of Present Illness:  Patient is in today with a recent episode of constipation. MS. Cannella notes that some years ago, after she had spinal surgery for 2 lumbar herniated disks, she developed and issue with constipation and hemorrhoids. She found her hemorrhoidectomy to be very painful. As such, she has been more vigilant about her bowel issues. She has had episodes of constipation at times, typically every 2-3 months. She notes she is very careful about her diet in general, trying to get plenty of fiber in her diet, including the use of supplemental flax seed. Last week, she developed about of constipation that was quite painful. She responded with taking some mag citrate and this weekend finally passed a large caliber stool that was quite hard and dry. She notes she has not had a colonoscopy in many years (>10 years). She is concerned that she may some something blocking her intestine.  Past Medical History: Patient Active Problem List   Diagnosis Date Noted  . Peripheral neuropathy 02/08/2019  . Spasm of thoracic back muscle 10/18/2018  . B12 deficiency 10/04/2018  . Obesity, unspecified 05/17/2013  . HTN (hypertension) 03/23/2013  . HLD (hyperlipidemia) 03/23/2013  . OA (osteoarthritis) 03/23/2013   Past Surgical History:  Procedure Laterality Date  . ABDOMINAL HYSTERECTOMY    . BACK SURGERY  1983  . CARPOMETACARPAL ([place]) FUSION OF THUMB Right 04/07/2017   Procedure: CARPOMETACARPAL [facility]) FUSION OF THUMB;   Surgeon: Earnestine Leys, MD;  Location: [facility] ORS;  Service: Orthopedics;  Laterality: Right;  . HEMORRHOID SURGERY    . TOOTH EXTRACTION     Family History  Problem Relation Age of Onset  . Heart disease Mother   . Alcohol abuse Father   . Arthritis Father   . Heart disease Father   . Stroke Father   . Hypertension Father   . Breast cancer Paternal Aunt 71  . Breast cancer Cousin    Outpatient Medications Prior to Visit  Medication Sig Dispense Refill  . amLODipine (NORVASC) 5 MG tablet Take 1 tablet (5 mg total) by mouth daily. Needs appt with new pcp for additional refills 90 tablet 3  . aspirin EC 81 MG tablet Take 81 mg by mouth daily.    . Cyanocobalamin (VITAMIN B-12 PO) Place 1 tablet under the tongue daily.     . Flaxseed Oil (LINSEED OIL) OIL by Does not apply route.    . gabapentin (NEURONTIN) 300 MG capsule Take 1 capsule (300 mg total) by mouth at bedtime. Need Establish with new pcp 90 capsule 3  . hydrochlorothiazide (HYDRODIURIL) 25 MG tablet Take 1 tablet (25 mg total) by mouth daily. Needs appt with new pcp for additional refills 90 tablet 3  . Multiple Vitamin (MULTIVITAMIN) tablet Take 1 tablet by mouth 2 (two) times daily.    . Omega-3 Fatty Acids (FISH OIL) 1000 MG CAPS Take 1,000 mg by mouth 2 (two) times daily.     . pravastatin (PRAVACHOL) 40 MG tablet  Take 1 tablet (40 mg total) by mouth daily. Needs appt with new pcp for additional refills 90 tablet 3   No facility-administered medications prior to visit.   Allergies  Allergen Reactions  . Demerol [Meperidine] Other (See Comments)    Makes her "looney"   Objective:   Today's Vitals   06/17/20 1444  BP: 124/76  Pulse: 84  Temp: 97.9 F (36.6 C)  TempSrc: Temporal  SpO2: 96%  Weight: 180 lb (81.6 kg)  Height: 5\' 3"  (1.6 m)   Body mass index is 31.89 kg/m.   General: Well developed, well nourished. No acute distress. Psych: Alert and oriented x3. Normal mood and affect.  There are no  preventive care reminders to display for this patient.    Assessment & Plan:   1. Chronic idiopathic constipation Ms. Costin has recurrent episodes of constipation. Her CCB and diuretic could contribute to this. She does appear to get adequate dietary fiber. She responds to occasional laxative use. I recommended she add a daily Colace (docusate sodium) to her regimen for stool softening. I will refer her to GI for a colonoscopy for assessment of her constipation and colon cancer screening.  - Ambulatory referral to Gastroenterology  Haydee Salter, MD

## 2020-07-19 DIAGNOSIS — M7541 Impingement syndrome of right shoulder: Secondary | ICD-10-CM | POA: Diagnosis not present

## 2020-07-19 DIAGNOSIS — M25572 Pain in left ankle and joints of left foot: Secondary | ICD-10-CM | POA: Diagnosis not present

## 2020-08-01 ENCOUNTER — Encounter: Payer: Self-pay | Admitting: Gastroenterology

## 2020-08-01 ENCOUNTER — Telehealth: Payer: Self-pay | Admitting: *Deleted

## 2020-08-01 ENCOUNTER — Other Ambulatory Visit: Payer: Self-pay

## 2020-08-01 ENCOUNTER — Ambulatory Visit: Payer: Medicare HMO | Admitting: Gastroenterology

## 2020-08-01 VITALS — BP 141/82 | HR 83 | Ht 63.0 in | Wt 184.6 lb

## 2020-08-01 DIAGNOSIS — R194 Change in bowel habit: Secondary | ICD-10-CM

## 2020-08-01 DIAGNOSIS — K5900 Constipation, unspecified: Secondary | ICD-10-CM | POA: Diagnosis not present

## 2020-08-01 MED ORDER — SUPREP BOWEL PREP KIT 17.5-3.13-1.6 GM/177ML PO SOLN: 1.0000 | ORAL | 0 refills | Status: DC

## 2020-08-01 NOTE — Chronic Care Management (AMB) (Signed)
  Chronic Care Management   Note  08/01/2020 Name: Monica Lindsey MRN: [id number] DOB: 10-19-1945  Monica Lindsey is a 75 y.o. year old female who is a primary care patient of Ronnald Nian, DO. I reached out to Bluford Kaufmann by phone today in response to a referral sent by [name]'s PCP Ronnald Nian, DO     [name] was given information about Chronic Care Management services today including:  CCM service includes personalized support from designated clinical staff supervised by her physician, including individualized plan of care and coordination with other care providers 24/7 contact phone numbers for assistance for urgent and routine care needs. Service will only be billed when office clinical staff spend 20 minutes or more in a month to coordinate care. Only one practitioner may furnish and bill the service in a calendar month. The patient may stop CCM services at any time (effective at the end of the month) by phone call to the office staff. The patient will be responsible for cost sharing (co-pay) of up to 20% of the service fee (after annual deductible is met).  Patient agreed to services and verbal consent obtained.   Follow up plan: Telephone appointment with care management team member scheduled for:08/28/2020  Julian Hy, [place], [place] Management  Direct Dial: [phone]

## 2020-08-01 NOTE — Progress Notes (Signed)
Gastroenterology Consultation  Referring Provider:     Haydee Salter, MD Primary Care Physician:  Monica Nian, DO Primary Gastroenterologist:  Dr. Allen Lindsey     Reason for Consultation:     Constipation        HPI:   Monica Lindsey is a 75 y.o. y/o female referred for consultation & management of constipation by Dr. Bryan Lindsey, Monica Fila, DO.  This patient comes to see me after being evaluated by her primary care provider for constipation.  The patient had reported that after having back surgery many years ago she has developed constipation.  The patient's last colonoscopy was in 2011 by Dr. Candace Lindsey.  The patient has been on a high-fiber diet and reports that she takes laxatives which have helped. The patient denies any black stools bloody stools or family history of colon cancer colon polyps.  The patient also reports that she finished the entire bottle of Colace and did not notice any difference in her bowel movements but then she reported she brought a generic stool softener that worked better.  Past Medical History:  Diagnosis Date  . Arthritis   . Constipation   . Frequent headaches   . Hay fever   . Hyperlipidemia   . Hypertension     Past Surgical History:  Procedure Laterality Date  . ABDOMINAL HYSTERECTOMY    . BACK SURGERY  1983  . CARPOMETACARPAL ([place]) FUSION OF THUMB Right 04/07/2017   Procedure: CARPOMETACARPAL [facility]) FUSION OF THUMB;  Surgeon: Monica Leys, MD;  Location: [facility] ORS;  Service: Orthopedics;  Laterality: Right;  . HEMORRHOID SURGERY    . TOOTH EXTRACTION      Prior to Admission medications   Medication Sig Start Date End Date Taking? Authorizing Provider  amLODipine (NORVASC) 5 MG tablet Take 1 tablet (5 mg total) by mouth daily. Needs appt with new pcp for additional refills 09/21/19   CiriglianoGarvin Fila, DO  aspirin EC 81 MG tablet Take 81 mg by mouth daily.    [provider]  Cyanocobalamin (VITAMIN B-12 PO) Place 1 tablet under the tongue  daily.     [provider]  Flaxseed Oil (LINSEED OIL) OIL by Does not apply route.    [provider]  gabapentin (NEURONTIN) 300 MG capsule Take 1 capsule (300 mg total) by mouth at bedtime. Need Establish with new pcp 09/21/19   Cirigliano, Monica Fila, DO  hydrochlorothiazide (HYDRODIURIL) 25 MG tablet Take 1 tablet (25 mg total) by mouth daily. Needs appt with new pcp for additional refills 09/21/19   Monica Nian, DO  Multiple Vitamin (MULTIVITAMIN) tablet Take 1 tablet by mouth 2 (two) times daily.    [provider]  Omega-3 Fatty Acids (FISH OIL) 1000 MG CAPS Take 1,000 mg by mouth 2 (two) times daily.     [provider]  pravastatin (PRAVACHOL) 40 MG tablet Take 1 tablet (40 mg total) by mouth daily. Needs appt with new pcp for additional refills 09/21/19   Monica Nian, DO    Family History  Problem Relation Age of Onset  . Heart disease Mother   . Alcohol abuse Father   . Arthritis Father   . Heart disease Father   . Stroke Father   . Hypertension Father   . Breast cancer Paternal Aunt 55  . Breast cancer Cousin      Social History   Tobacco Use  . Smoking status: Never  . Smokeless tobacco: Never  Vaping Use  . Vaping Use: Never used  Substance Use Topics  . Alcohol use: Yes    Comment: rare  . Drug use: No    Allergies as of 08/01/2020 - Review Complete 06/17/2020  Allergen Reaction Noted  . Demerol [meperidine] Other (See Comments) 03/23/2013    Review of Systems:    All systems reviewed and negative except where noted in HPI.   Physical Exam:  There were no vitals taken for this visit. No LMP recorded. Patient has had a hysterectomy. General:   Alert,  Well-developed, well-nourished, pleasant and cooperative in NAD Head:  Normocephalic and atraumatic. Eyes:  Sclera clear, no icterus.   Conjunctiva pink. Ears:  Normal auditory acuity. Neck:  Supple; no masses or thyromegaly. Lungs:  Respirations even and  unlabored.  Clear throughout to auscultation.   No wheezes, crackles, or rhonchi. No acute distress. Heart:  Regular rate and rhythm; no murmurs, clicks, rubs, or gallops. Abdomen:  Normal bowel sounds.  No bruits.  Soft, non-tender and non-distended without masses, hepatosplenomegaly or hernias noted.  No guarding or rebound tenderness.  Negative Carnett sign.   Rectal:  Deferred.  Pulses:  Normal pulses noted. Extremities:  No clubbing or edema.  No cyanosis. Neurologic:  Alert and oriented x3;  grossly normal neurologically. Skin:  Intact without significant lesions or rashes.  No jaundice. Lymph Nodes:  No significant cervical adenopathy. Psych:  Alert and cooperative. Normal mood and affect.  Imaging Studies: No results found.  Assessment and Plan:   Monica Lindsey is a 75 y.o. y/o female Who comes in today with a history of chronic constipation that has gotten worse recently.  The patient will be set up for a colonoscopy.  The patient will also be started on samples of Linzess.  She has been given samples of 290 g and 145 Micrograms to see which one helps her.  The patient will follow up the time of colonoscopy.  The patient will also let me know if the medication does or does not work for her.  The patient has been explained the plan and agrees with it.    Monica Lame, MD. Marval Regal    Note: This dictation was prepared with Dragon dictation along with smaller phrase technology. Any transcriptional errors that result from this process are unintentional.

## 2020-08-06 ENCOUNTER — Other Ambulatory Visit: Payer: Self-pay

## 2020-08-06 DIAGNOSIS — R194 Change in bowel habit: Secondary | ICD-10-CM

## 2020-08-06 DIAGNOSIS — K5900 Constipation, unspecified: Secondary | ICD-10-CM

## 2020-08-08 ENCOUNTER — Telehealth: Payer: Self-pay | Admitting: Family Medicine

## 2020-08-08 ENCOUNTER — Other Ambulatory Visit: Payer: Self-pay

## 2020-08-08 NOTE — Telephone Encounter (Signed)
Pt has small growth on here butt and she requested call back from Dr De Hollingshead nurse, please advise. [phone]

## 2020-08-08 NOTE — Telephone Encounter (Signed)
Pt called and scheduled to come in office.

## 2020-08-09 ENCOUNTER — Other Ambulatory Visit: Payer: Self-pay

## 2020-08-09 ENCOUNTER — Encounter: Payer: Self-pay | Admitting: Family Medicine

## 2020-08-09 ENCOUNTER — Ambulatory Visit (INDEPENDENT_AMBULATORY_CARE_PROVIDER_SITE_OTHER): Payer: Medicare HMO | Admitting: Family Medicine

## 2020-08-09 VITALS — BP 122/80 | HR 71 | Temp 97.9°F | Ht 63.0 in | Wt 181.8 lb

## 2020-08-09 DIAGNOSIS — L918 Other hypertrophic disorders of the skin: Secondary | ICD-10-CM

## 2020-08-09 NOTE — Patient Instructions (Signed)
Recommend sitz bath twice a day until improved.

## 2020-08-09 NOTE — Progress Notes (Signed)
[facility] [address] [place] [address] Dept: [phone] Dept Fax: [phone]  Office Visit  Subjective:    Patient ID: Monica Lindsey, female    DOB: [birth year], 75 y.o..   MRN: [id number]  Chief Complaint  Patient presents with   Acute Visit    C/o having a (?) skin tag on her bottom x years.  Has gotten irritated in last 3 weeks.     History of Present Illness:  Patient is in today for evaluation of a skin tag that has become quite tender and inflamed. She notes she has had a skin tag on her buttocks for at least 5 years. More recently, she took a car trip to [place] and back. On her return trip yesterday, she noted that she was having considerably more discomfort. She has been putting aloe vera on this site.  Past Medical History: Patient Active Problem List   Diagnosis Date Noted   Peripheral neuropathy 02/08/2019   Spasm of thoracic back muscle 10/18/2018   B12 deficiency 10/04/2018   Obesity, unspecified 05/17/2013   HTN (hypertension) 03/23/2013   HLD (hyperlipidemia) 03/23/2013   OA (osteoarthritis) 03/23/2013   Past Surgical History:  Procedure Laterality Date   ABDOMINAL HYSTERECTOMY     BACK SURGERY  1983   CARPOMETACARPAL ([place]) FUSION OF THUMB Right 04/07/2017   Procedure: CARPOMETACARPAL ([place]) FUSION OF THUMB;  Surgeon: Earnestine Leys, MD;  Location: [facility] ORS;  Service: Orthopedics;  Laterality: Right;   HEMORRHOID SURGERY     TOOTH EXTRACTION     Family History  Problem Relation Age of Onset   Heart disease Mother    Alcohol abuse Father    Arthritis Father    Heart disease Father    Stroke Father    Hypertension Father    Breast cancer Paternal Aunt 43   Breast cancer Cousin    Outpatient Medications Prior to Visit  Medication Sig Dispense Refill   amLODipine (NORVASC) 5 MG tablet Take 1 tablet (5 mg total) by mouth daily. Needs appt with new pcp for additional refills 90 tablet 3    aspirin EC 81 MG tablet Take 81 mg by mouth daily.     Cyanocobalamin (VITAMIN B-12 PO) Place 1 tablet under the tongue daily.      Flaxseed Oil (LINSEED OIL) OIL by Does not apply route.     hydrochlorothiazide (HYDRODIURIL) 25 MG tablet Take 1 tablet (25 mg total) by mouth daily. Needs appt with new pcp for additional refills 90 tablet 3   Multiple Vitamin (MULTIVITAMIN) tablet Take 1 tablet by mouth 2 (two) times daily.     Na Sulfate-K Sulfate-Mg Sulf (SUPREP BOWEL PREP KIT) 17.5-3.13-1.6 GM/177ML SOLN Take 1 kit by mouth as directed. 354 mL 0   Omega-3 Fatty Acids (FISH OIL) 1000 MG CAPS Take 1,000 mg by mouth 2 (two) times daily.      pravastatin (PRAVACHOL) 40 MG tablet Take 1 tablet (40 mg total) by mouth daily. Needs appt with new pcp for additional refills 90 tablet 3   Vitamin D, Cholecalciferol, 25 MCG (1000 UT) TABS Take by mouth.     Niacin (VITAMIN B-3 PO) Take by mouth.     gabapentin (NEURONTIN) 300 MG capsule Take 1 capsule (300 mg total) by mouth at bedtime. Need Establish with new pcp (Patient not taking: Reported on 08/09/2020) 90 capsule 3   No facility-administered medications prior to visit.   Allergies  Allergen Reactions   Demerol [  Meperidine] Other (See Comments)    Makes her "looney"    Objective:   Today's Vitals   08/09/20 1043  BP: 122/80  Pulse: 71  Temp: 97.9 F (36.6 C)  TempSrc: Temporal  SpO2: 95%  Weight: 181 lb 12.8 oz (82.5 kg)  Height: _0  (1.6 m)   Body mass index is 32.2 kg/m.   General: Well developed, well nourished. No acute distress. Skin: There is a 3 mm papule present on the right buttocks. There is no fluctuance or induration. This is   where the patient indicates she had a "skin tag". She is surprised that it is no longer there, as it was   present this morning. Psych: Alert and oriented. Normal mood and affect.  Health Maintenance Due  Topic Date Due   Zoster Vaccines- Shingrix (2 of 2) 12/15/2018   COVID-19 Vaccine (4  - Booster for Pfizer series) 03/09/2020     Assessment & Plan:   1. Inflamed acrochordon It appears Monica Lindsey's skin tag may have fallen off on its own earlier today. IN any case, there is no visible tag there currently. The remaining papule is small and does not appear infected. I recommend she do some Sitz baths for a few days until her discomfort has resolved. Follow-up as needed.  Haydee Salter, MD

## 2020-08-19 ENCOUNTER — Other Ambulatory Visit: Payer: Self-pay

## 2020-08-25 ENCOUNTER — Other Ambulatory Visit: Payer: Self-pay | Admitting: Family Medicine

## 2020-08-25 DIAGNOSIS — I10 Essential (primary) hypertension: Secondary | ICD-10-CM

## 2020-08-28 ENCOUNTER — Telehealth: Payer: Self-pay

## 2020-08-28 ENCOUNTER — Telehealth: Payer: Medicare HMO

## 2020-08-28 DIAGNOSIS — M722 Plantar fascial fibromatosis: Secondary | ICD-10-CM | POA: Diagnosis not present

## 2020-08-28 DIAGNOSIS — M13872 Other specified arthritis, left ankle and foot: Secondary | ICD-10-CM | POA: Diagnosis not present

## 2020-08-28 NOTE — Telephone Encounter (Signed)
  Care Management   Follow Up Note   08/28/2020 Name: Monica Lindsey MRN: [id number] DOB: Feb 01, 1946   Referred by: Ronnald Nian, DO Reason for referral : Chronic Care Management (Initial assessment)   An unsuccessful telephone outreach was attempted today. The patient was referred to the case management team for assistance with care management and care coordination. Unable to reach patient or leave voice mail message due to mail box being full.   Follow Up Plan: The care management team will reach out to the patient again over the next 2 weeks.    Quinn Plowman RN,BSN,CCM RN Case Manager [place] [phone]

## 2020-09-09 ENCOUNTER — Telehealth: Payer: Self-pay

## 2020-09-09 NOTE — Telephone Encounter (Signed)
Pt returned my call and instructions were discussed with the pt. Also, advised her on the bowel prep.

## 2020-09-09 NOTE — Telephone Encounter (Signed)
Tried returning pt's call regarding questions about her instructions instructions for her colonoscopy scheduled for 09/10/20. Could not leave a message on home phone or cell phone. Mailbox was full on both numbers.

## 2020-09-10 ENCOUNTER — Ambulatory Visit: Payer: Medicare HMO | Admitting: Certified Registered"

## 2020-09-10 ENCOUNTER — Encounter
Admission: RE | Disposition: A | Discharge status: Home / Self Care | Payer: Self-pay | Source: Home / Self Care | Attending: Gastroenterology

## 2020-09-10 ENCOUNTER — Ambulatory Visit
Admission: RE | Admit: 2020-09-10 | Discharge: 2020-09-10 | Disposition: A | Discharge status: Home / Self Care | Payer: Medicare HMO | Attending: Gastroenterology | Admitting: Gastroenterology

## 2020-09-10 ENCOUNTER — Encounter: Payer: Self-pay | Admitting: Gastroenterology

## 2020-09-10 DIAGNOSIS — K635 Polyp of colon: Secondary | ICD-10-CM

## 2020-09-10 DIAGNOSIS — Z79899 Other long term (current) drug therapy: Secondary | ICD-10-CM | POA: Insufficient documentation

## 2020-09-10 DIAGNOSIS — Z8249 Family history of ischemic heart disease and other diseases of the circulatory system: Secondary | ICD-10-CM | POA: Diagnosis not present

## 2020-09-10 DIAGNOSIS — D125 Benign neoplasm of sigmoid colon: Secondary | ICD-10-CM | POA: Insufficient documentation

## 2020-09-10 DIAGNOSIS — Z823 Family history of stroke: Secondary | ICD-10-CM | POA: Diagnosis not present

## 2020-09-10 DIAGNOSIS — D120 Benign neoplasm of cecum: Secondary | ICD-10-CM | POA: Insufficient documentation

## 2020-09-10 DIAGNOSIS — Z8261 Family history of arthritis: Secondary | ICD-10-CM | POA: Insufficient documentation

## 2020-09-10 DIAGNOSIS — R194 Change in bowel habit: Secondary | ICD-10-CM

## 2020-09-10 DIAGNOSIS — Z885 Allergy status to narcotic agent status: Secondary | ICD-10-CM | POA: Insufficient documentation

## 2020-09-10 DIAGNOSIS — D126 Benign neoplasm of colon, unspecified: Secondary | ICD-10-CM | POA: Diagnosis not present

## 2020-09-10 DIAGNOSIS — K5900 Constipation, unspecified: Secondary | ICD-10-CM | POA: Insufficient documentation

## 2020-09-10 DIAGNOSIS — K640 First degree hemorrhoids: Secondary | ICD-10-CM | POA: Insufficient documentation

## 2020-09-10 DIAGNOSIS — Z803 Family history of malignant neoplasm of breast: Secondary | ICD-10-CM | POA: Diagnosis not present

## 2020-09-10 HISTORY — PX: COLONOSCOPY WITH PROPOFOL: SHX5780

## 2020-09-10 HISTORY — DX: Calcaneal spur, unspecified foot: M77.30

## 2020-09-10 SURGERY — COLONOSCOPY WITH PROPOFOL
Anesthesia: General

## 2020-09-10 MED ORDER — LIDOCAINE 2% (20 MG/ML) 5 ML SYRINGE
INTRAMUSCULAR | Status: DC | PRN
  Administered 2020-09-10: 20 mg via INTRAVENOUS

## 2020-09-10 MED ORDER — PROPOFOL 10 MG/ML IV BOLUS
INTRAVENOUS | Status: DC | PRN
  Administered 2020-09-10: 70 mg via INTRAVENOUS
  Administered 2020-09-10: 30 mg via INTRAVENOUS

## 2020-09-10 MED ORDER — SODIUM CHLORIDE 0.9 % IV SOLN: INTRAVENOUS | Status: DC

## 2020-09-10 MED ORDER — PROPOFOL 500 MG/50ML IV EMUL
INTRAVENOUS | Status: DC | PRN
  Administered 2020-09-10: 120 ug/kg/min via INTRAVENOUS

## 2020-09-10 NOTE — Op Note (Signed)
[facility] Patient Name: Monica Lindsey Procedure Date: 09/10/2020 10:39 AM MRN: [id number] Account #: [id number] Date of Birth: 03-03-1945 Admit Type: Outpatient Age: 75 Room: [facility] ENDO ROOM 4 Gender: Female Note Status: Finalized Procedure:             Colonoscopy Indications:           Constipation Providers:             [name] MD, MD Medicines:             Propofol per Anesthesia Complications:         No immediate complications. Procedure:             Pre-Anesthesia Assessment:                        - Prior to the procedure, a History and Physical was                         performed, and patient medications and allergies were                         reviewed. The patient's tolerance of previous                         anesthesia was also reviewed. The risks and benefits                         of the procedure and the sedation options and risks                         were discussed with the patient. All questions were                         answered, and informed consent was obtained. Prior                         Anticoagulants: The patient has taken no previous                         anticoagulant or antiplatelet agents. ASA Grade                         Assessment: II - A patient with mild systemic disease.                         After reviewing the risks and benefits, the patient                         was deemed in satisfactory condition to undergo the                         procedure.                        After obtaining informed consent, the colonoscope was                         passed under direct vision. Throughout the procedure,  the patient's blood pressure, pulse, and oxygen                         saturations were monitored continuously. The                         Colonoscope was introduced through the anus and                         advanced to the the cecum, identified by appendiceal                          orifice and ileocecal valve. The colonoscopy was                         performed without difficulty. The patient tolerated                         the procedure well. The quality of the bowel                         preparation was excellent. Findings:      The perianal and digital rectal examinations were normal.      A 2 mm polyp was found in the cecum. The polyp was sessile. The polyp       was removed with a cold biopsy forceps. Resection and retrieval were       complete.      A 6 mm polyp was found in the sigmoid colon. The polyp was pedunculated.       The polyp was removed with a cold snare. Resection and retrieval were       complete.      Non-bleeding internal hemorrhoids were found during retroflexion. The       hemorrhoids were Grade I (internal hemorrhoids that do not prolapse). Impression:            - One 2 mm polyp in the cecum, removed with a cold                         biopsy forceps. Resected and retrieved.                        - One 6 mm polyp in the sigmoid colon, removed with a                         cold snare. Resected and retrieved.                        - Non-bleeding internal hemorrhoids. Recommendation:        - Discharge patient to home.                        - Resume previous diet.                        - Continue present medications.                        - Await pathology results.                        -  Repeat colonoscopy is not recommended for                         surveillance. Procedure Code(s):     --- Professional ---                        [phone], Colonoscopy, flexible; with removal of                         tumor(s), polyp(s), or other lesion(s) by snare                         technique                        45380, 59, Colonoscopy, flexible; with biopsy, single                         or multiple Diagnosis Code(s):     --- Professional ---                        K59.00, Constipation, unspecified                         K63.5, Polyp of colon CPT copyright 2019 American Medical Association. All rights reserved. The codes documented in this report are preliminary and upon coder review may  be revised to meet current compliance requirements. [name] MD, MD 09/10/2020 10:57:58 AM This report has been signed electronically. Number of Addenda: 0 Note Initiated On: 09/10/2020 10:39 AM Scope Withdrawal Time: 0 hours 7 minutes 30 seconds  Total Procedure Duration: 0 hours 13 minutes 30 seconds  Estimated Blood Loss:  Estimated blood loss: none.      [facility]

## 2020-09-10 NOTE — Anesthesia Postprocedure Evaluation (Signed)
Anesthesia Post Note  Patient: Monica Lindsey  Procedure(s) Performed: COLONOSCOPY WITH PROPOFOL  Patient location during evaluation: PACU Anesthesia Type: General Level of consciousness: awake and alert Pain management: pain level controlled Vital Signs Assessment: post-procedure vital signs reviewed and stable Respiratory status: spontaneous breathing, nonlabored ventilation, respiratory function stable and patient connected to nasal cannula oxygen Cardiovascular status: blood pressure returned to baseline and stable Postop Assessment: no apparent nausea or vomiting Anesthetic complications: no   No notable events documented.   Last Vitals:  Vitals:   09/10/20 1110 09/10/20 1120  BP: 136/74 (!) 143/78  Pulse: 73 (!) 56  Resp: (!) 25 19  Temp:    SpO2: 98% 99%    Last Pain:  Vitals:   09/10/20 0933  TempSrc: Temporal  PainSc: 0-No pain                 Jaton Eilers Doyne Keel

## 2020-09-10 NOTE — Transfer of Care (Signed)
Immediate Anesthesia Transfer of Care Note  Patient: Monica Lindsey  Procedure(s) Performed: COLONOSCOPY WITH PROPOFOL  Patient Location: Endoscopy Unit  Anesthesia Type:General  Level of Consciousness: drowsy  Airway & Oxygen Therapy: Patient Spontanous Breathing  Post-op Assessment: Report given to RN and Post -op Vital signs reviewed and stable  Post vital signs: Reviewed and stable  Last Vitals:  Vitals Value Taken Time  BP 117/63 09/10/20 1059  Temp    Pulse 65 09/10/20 1100  Resp 17 09/10/20 1100  SpO2 97 % 09/10/20 1100  Vitals shown include unvalidated device data.  Last Pain:  Vitals:   09/10/20 0933  TempSrc: Temporal  PainSc: 0-No pain         Complications: No notable events documented.

## 2020-09-10 NOTE — H&P (Signed)
Monica Lame, MD [address] [place], [place] [address] Phone:[phone] Fax : [phone]  Primary Care Physician:  Ronnald Nian, DO Primary Gastroenterologist:  Dr. Allen Norris  Pre-Procedure History & Physical: HPI:  Monica Lindsey is a 75 y.o. female is here for an colonoscopy.   Past Medical History:  Diagnosis Date   Arthritis    Constipation    Frequent headaches    Hay fever    Heel spur    Hyperlipidemia    Hypertension     Past Surgical History:  Procedure Laterality Date   ABDOMINAL HYSTERECTOMY     BACK SURGERY  02/02/1981   CARPOMETACARPAL ([place]) FUSION OF THUMB Right 04/07/2017   Procedure: CARPOMETACARPAL ([place]) FUSION OF THUMB;  Surgeon: Earnestine Leys, MD;  Location: [facility] ORS;  Service: Orthopedics;  Laterality: Right;   COLONOSCOPY     HEMORRHOID SURGERY     TOOTH EXTRACTION      Prior to Admission medications   Medication Sig Start Date End Date Taking? Authorizing Provider  amLODipine (NORVASC) 5 MG tablet TAKE 1 TABLET DAILY. NEEDS APPT WITH NEW PCP FOR ADDITIONAL REFILLS 08/26/20  Yes Cirigliano, Garvin Fila, DO  aspirin EC 81 MG tablet Take 81 mg by mouth daily.   Yes [provider]  Cyanocobalamin (VITAMIN B-12 PO) Place 1 tablet under the tongue daily.    Yes [provider]  hydrochlorothiazide (HYDRODIURIL) 25 MG tablet Take 1 tablet (25 mg total) by mouth daily. Needs appt with new pcp for additional refills 09/21/19  Yes Cirigliano, Mary K, DO  Na Sulfate-K Sulfate-Mg Sulf (SUPREP BOWEL PREP KIT) 17.5-3.13-1.6 GM/177ML SOLN Take 1 kit by mouth as directed. 08/01/20  Yes Monica Lame, MD  Flaxseed Oil (LINSEED OIL) OIL by Does not apply route. Patient not taking: Reported on 09/10/2020    [provider]  gabapentin (NEURONTIN) 300 MG capsule Take 1 capsule (300 mg total) by mouth at bedtime. Need Establish with new pcp 09/21/19   Ronnald Nian, DO  Multiple Vitamin (MULTIVITAMIN) tablet Take 1 tablet by  mouth 2 (two) times daily.    [provider]  Omega-3 Fatty Acids (FISH OIL) 1000 MG CAPS Take 1,000 mg by mouth 2 (two) times daily.     [provider]  pravastatin (PRAVACHOL) 40 MG tablet Take 1 tablet (40 mg total) by mouth daily. Needs appt with new pcp for additional refills 09/21/19   Ronnald Nian, DO  Vitamin D, Cholecalciferol, 25 MCG (1000 UT) TABS Take by mouth.    [provider]    Allergies as of 08/06/2020 - Review Complete 08/01/2020  Allergen Reaction Noted   Demerol [meperidine] Other (See Comments) 03/23/2013    Family History  Problem Relation Age of Onset   Heart disease Mother    Alcohol abuse Father    Arthritis Father    Heart disease Father    Stroke Father    Hypertension Father    Breast cancer Paternal Aunt 23   Breast cancer Cousin     Social History   Socioeconomic History   Marital status: Widowed    Spouse name: Not on file   Number of children: Not on file   Years of education: Not on file   Highest education level: Not on file  Occupational History   Not on file  Tobacco Use   Smoking status: Never   Smokeless tobacco: Never  Vaping Use   Vaping Use: Never used  Substance and  Sexual Activity   Alcohol use: Yes    Comment: rare   Drug use: No   Sexual activity: Never  Other Topics Concern   Not on file  Social History Narrative   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   Widowed.      Still working 3 days per week.  Very close to her children and grand children.      Daughter, [name], is HPOA.  Has a living will.   Would desire CPR, would not desire prolonged life support if futile.            Social Determinants of Health   Financial Resource Strain: Low Risk    Difficulty of Paying Living Expenses: Not hard at all  Food Insecurity: No Food Insecurity   Worried About Charity fundraiser in the Last Year: Never true   [place] in the Last Year: Never true   Transportation Needs: No Transportation Needs   Lack of Transportation (Medical): No   Lack of Transportation (Non-Medical): No  Physical Activity: Inactive   Days of Exercise per Week: 0 days   Minutes of Exercise per Session: 0 min  Stress: No Stress Concern Present   Feeling of Stress : Not at all  Social Connections: Moderately Integrated   Frequency of Communication with Friends and Family: More than three times a week   Frequency of Social Gatherings with Friends and Family: More than three times a week   Attends Religious Services: 1 to 4 times per year   Active Member of [employer] or Organizations: Yes   Attends Archivist Meetings: 1 to 4 times per year   Marital Status: Widowed  Human resources officer Violence: Not At Risk   Fear of Current or Ex-Partner: No   Emotionally Abused: No   Physically Abused: No   Sexually Abused: No    Review of Systems: See HPI, otherwise negative ROS  Physical Exam: BP (!) 129/106   Pulse 89   Temp (!) 96.9 F (36.1 C) (Temporal)   Resp 18   Ht 5' 3"  (1.6 m)   Wt 81.2 kg   SpO2 98%   BMI 31.71 kg/m  General:   Alert,  pleasant and cooperative in NAD Head:  Normocephalic and atraumatic. Neck:  Supple; no masses or thyromegaly. Lungs:  Clear throughout to auscultation.    Heart:  Regular rate and rhythm. Abdomen:  Soft, nontender and nondistended. Normal bowel sounds, without guarding, and without rebound.   Neurologic:  Alert and  oriented x4;  grossly normal neurologically.  Impression/Plan: Monica Lindsey is here for an colonoscopy to be performed for change in bowel habits with constipation  Risks, benefits, limitations, and alternatives regarding  colonoscopy have been reviewed with the patient.  Questions have been answered.  All parties agreeable.   Monica Lame, MD  09/10/2020, 10:36 AM

## 2020-09-10 NOTE — Anesthesia Preprocedure Evaluation (Signed)
Anesthesia Evaluation  Patient identified by MRN, date of birth, ID band Patient awake    Reviewed: Allergy & Precautions, NPO status   History of Anesthesia Complications Negative for: history of anesthetic complications  Airway Mallampati: I  TM Distance: >3 FB Neck ROM: Full    Dental no notable dental hx. (+) Teeth Intact   Pulmonary    Pulmonary exam normal        Cardiovascular Exercise Tolerance: Good hypertension, Normal cardiovascular exam     Neuro/Psych    GI/Hepatic   Endo/Other    Renal/GU      Musculoskeletal   Abdominal Normal abdominal exam  (+)   Peds  Hematology   Anesthesia Other Findings   Reproductive/Obstetrics                             Anesthesia Physical Anesthesia Plan  ASA: 2  Anesthesia Plan: General   Post-op Pain Management:    Induction: Intravenous  PONV Risk Score and Plan:   Airway Management Planned: Simple Face Mask  Additional Equipment: None  Intra-op Plan:   Post-operative Plan:   Informed Consent: I have reviewed the patients History and Physical, chart, labs and discussed the procedure including the risks, benefits and alternatives for the proposed anesthesia with the patient or authorized representative who has indicated his/her understanding and acceptance.       Plan Discussed with: CRNA  Anesthesia Plan Comments:         Anesthesia Quick Evaluation

## 2020-09-11 ENCOUNTER — Encounter: Payer: Self-pay | Admitting: Gastroenterology

## 2020-09-11 LAB — SURGICAL PATHOLOGY

## 2020-09-13 ENCOUNTER — Other Ambulatory Visit: Payer: Self-pay | Admitting: Family Medicine

## 2020-09-13 ENCOUNTER — Encounter: Payer: Self-pay | Admitting: Gastroenterology

## 2020-09-13 DIAGNOSIS — I10 Essential (primary) hypertension: Secondary | ICD-10-CM

## 2020-09-18 ENCOUNTER — Ambulatory Visit (INDEPENDENT_AMBULATORY_CARE_PROVIDER_SITE_OTHER): Payer: Medicare HMO

## 2020-09-18 DIAGNOSIS — I10 Essential (primary) hypertension: Secondary | ICD-10-CM | POA: Diagnosis not present

## 2020-09-18 DIAGNOSIS — E785 Hyperlipidemia, unspecified: Secondary | ICD-10-CM

## 2020-09-18 NOTE — Patient Instructions (Signed)
Visit Information:  Thank you for taking the time to speak with me today   PATIENT GOALS:   Goals Addressed             This Visit's Progress    COMPLETED: Increase physical activity       Track and Manage My Blood Pressure-Hypertension   On track    Timeframe:  Long-Range Goal Priority:  Medium Start Date:   09/18/2020                          Expected End Date:   01/01/2021                    Follow Up Date 11/13/2020   - take your medications as prescribed and refill timely - Follow up with your doctor as recommended - Eat low salt, heart healthy meals full of fruits,vegetables, whole grains, lean protein, and limit fat, and sugars - Stay active    Why is this important?   You won't feel high blood pressure, but it can still hurt your blood vessels.  High blood pressure can cause heart or kidney problems. It can also cause a stroke.  Making lifestyle changes like losing a little weight or eating less salt will help.  Checking your blood pressure at home and at different times of the day can help to control blood pressure.  If the doctor prescribes medicine remember to take it the way the doctor ordered.  Call the office if you cannot afford the medicine or if there are questions about it.             Consent to CCM Services: Ms. Schalk was given information about Chronic Care Management services including:  CCM service includes personalized support from designated clinical staff supervised by her physician, including individualized plan of care and coordination with other care providers 24/7 contact phone numbers for assistance for urgent and routine care needs. Service will only be billed when office clinical staff spend 20 minutes or more in a month to coordinate care. Only one practitioner may furnish and bill the service in a calendar month. The patient may stop CCM services at any time (effective at the end of the month) by phone call to the office staff. The  patient will be responsible for cost sharing (co-pay) of up to 20% of the service fee (after annual deductible is met).  Patient agreed to services and verbal consent obtained.   Patient verbalizes understanding of instructions provided today and agrees to view in [place].   The patient has been provided with contact information for the care management team and has been advised to call with any health related questions or concerns.  The care management team will reach out to the patient again over the next 2 months .   Quinn Plowman RN,BSN,CCM RN Case Manager [place] [phone]   CLINICAL CARE PLAN: Patient Care Plan: Cardiovascular     Problem Identified: Hypertension (Hypertension)      Long-Range Goal: Hypertension managed   Start Date: 09/18/2020  Expected End Date: 01/01/2021  This Visit's Progress: On track  Priority: Medium  Note:   Objective:  Last practice recorded BP readings:  BP Readings from Last 3 Encounters:  09/10/20 (!) 143/78  08/09/20 122/80  08/01/20 (!) 141/82  Current Barriers:  Knowledge Deficits related to long term care plan for self management of hypertension/ hyperlipidemia:  Patient prefers not to check blood  pressure at this time. Denies having monitor at home. Per chart review patients last primary care provider visit was 08/23/2020.  Patients blood pressure was 122/80 at that time.  Case Manager Clinical Goal(s):  patient will verbalize understanding of plan for hypertension management patient will attend  scheduled medical appointments patient will demonstrate improved adherence to prescribed treatment plan for hypertension as evidenced by taking all medications as prescribed, monitoring and recording blood pressure as directed, adhering to low sodium/DASH diet Interventions:  Collaboration with No primary care provider on file. regarding development and update of comprehensive plan of care as evidenced by provider attestation  and co-signature Inter-disciplinary care team collaboration (see longitudinal plan of care) Evaluation of current treatment plan related to hypertension self management and patient's adherence to plan as established by provider. Provided education to patient re: stroke prevention, s/s of heart attack and stroke, DASH diet, complications of uncontrolled blood pressure Reviewed medications with patient and discussed importance of compliance Discussed plans with patient for ongoing care management follow up and provided patient with direct contact information for care management team Reviewed scheduled/upcoming provider appointments  Patient Goals: - take your medications as prescribed and refill timely - Follow up with your doctor as recommended - Eat low salt, heart healthy meals full of fruits,vegetables, whole grains, lean protein, and limit fat, and sugars - Stay active Follow Up Plan: The patient has been provided with contact information for the care management team and has been advised to call with any health related questions or concerns.  The care management team will reach out to the patient again over the next 2 months.

## 2020-09-18 NOTE — Chronic Care Management (AMB) (Signed)
Chronic Care Management   CCM RN Visit Note  09/18/2020 Name: Monica Lindsey MRN: [id number] DOB: Aug 25, 1945  Subjective: Monica Lindsey is a 75 y.o. year old female who is a primary care patient of No primary care provider on file.. The care management team was consulted for assistance with disease management and care coordination needs.    Engaged with patient by telephone for initial visit in response to provider referral for case management and/or care coordination services.   Consent to Services:  The patient was given the following information about Chronic Care Management services today, agreed to services, and gave verbal consent: 1. CCM service includes personalized support from designated clinical staff supervised by the primary care provider, including individualized plan of care and coordination with other care providers 2. 24/7 contact phone numbers for assistance for urgent and routine care needs. 3. Service will only be billed when office clinical staff spend 20 minutes or more in a month to coordinate care. 4. Only one practitioner may furnish and bill the service in a calendar month. 5.The patient may stop CCM services at any time (effective at the end of the month) by phone call to the office staff. 6. The patient will be responsible for cost sharing (co-pay) of up to 20% of the service fee (after annual deductible is met). Patient agreed to services and consent obtained.  Patient agreed to services and verbal consent obtained.   Assessment: Review of patient past medical history, allergies, medications, health status, including review of consultants reports, laboratory and other test data, was performed as part of comprehensive evaluation and provision of chronic care management services.   SDOH (Social Determinants of Health) assessments and interventions performed:    CCM Care Plan  Allergies  Allergen Reactions   Demerol [Meperidine] Other (See Comments)    Makes her  "looney"    Outpatient Encounter Medications as of 09/18/2020  Medication Sig   amLODipine (NORVASC) 5 MG tablet TAKE 1 TABLET DAILY. NEEDS APPT WITH NEW PCP FOR ADDITIONAL REFILLS   aspirin EC 81 MG tablet Take 81 mg by mouth daily.   Cyanocobalamin (VITAMIN B-12 PO) Place 1 tablet under the tongue daily.    hydrochlorothiazide (HYDRODIURIL) 25 MG tablet TAKE 1 TABLET DAILY. NEEDS APPT WITH NEW PCP FOR ADDITIONAL REFILLS   Multiple Vitamin (MULTIVITAMIN) tablet Take 1 tablet by mouth 2 (two) times daily.   Omega-3 Fatty Acids (FISH OIL) 1000 MG CAPS Take 1,000 mg by mouth 2 (two) times daily.    pravastatin (PRAVACHOL) 40 MG tablet Take 1 tablet (40 mg total) by mouth daily. Needs appt with new pcp for additional refills   Vitamin D, Cholecalciferol, 25 MCG (1000 UT) TABS Take by mouth.   Flaxseed Oil (LINSEED OIL) OIL by Does not apply route. (Patient not taking: Reported on 09/10/2020)   gabapentin (NEURONTIN) 300 MG capsule Take 1 capsule (300 mg total) by mouth at bedtime. Need Establish with new pcp (Patient not taking: Reported on 09/18/2020)   Na Sulfate-K Sulfate-Mg Sulf (SUPREP BOWEL PREP KIT) 17.5-3.13-1.6 GM/177ML SOLN Take 1 kit by mouth as directed. (Patient not taking: Reported on 09/18/2020)   No facility-administered encounter medications on file as of 09/18/2020.    Patient Active Problem List   Diagnosis Date Noted   Change in bowel habits    Constipation    Polyp of sigmoid colon    Peripheral neuropathy 02/08/2019   Spasm of thoracic back muscle 10/18/2018   B12 deficiency 10/04/2018  Obesity, unspecified 05/17/2013   HTN (hypertension) 03/23/2013   HLD (hyperlipidemia) 03/23/2013   OA (osteoarthritis) 03/23/2013    Conditions to be addressed/monitored:HTN and HLD  Care Plan : Cardiovascular  Updates made by Dannielle Karvonen, RN since 09/18/2020 12:00 AM     Problem: Hypertension (Hypertension)      Long-Range Goal: Hypertension managed   Start Date:  09/18/2020  Expected End Date: 01/01/2021  This Visit's Progress: On track  Priority: Medium  Note:   Objective:  Last practice recorded BP readings:  BP Readings from Last 3 Encounters:  09/10/20 (!) 143/78  08/09/20 122/80  08/01/20 (!) 141/82  Current Barriers:  Knowledge Deficits related to long term care plan for self management of hypertension/ hyperlipidemia:  Patient prefers not to check blood pressure at this time. Denies having monitor at home. Per chart review patients last primary care provider visit was 08/23/2020.  Patients blood pressure was 122/80 at that time.  Case Manager Clinical Goal(s):  patient will verbalize understanding of plan for hypertension management patient will attend  scheduled medical appointments patient will demonstrate improved adherence to prescribed treatment plan for hypertension as evidenced by taking all medications as prescribed, monitoring and recording blood pressure as directed, adhering to low sodium/DASH diet Interventions:  Collaboration with No primary care provider on file. regarding development and update of comprehensive plan of care as evidenced by provider attestation and co-signature Inter-disciplinary care team collaboration (see longitudinal plan of care) Evaluation of current treatment plan related to hypertension self management and patient's adherence to plan as established by provider. Provided education to patient re: stroke prevention, s/s of heart attack and stroke, DASH diet, complications of uncontrolled blood pressure Reviewed medications with patient and discussed importance of compliance Discussed plans with patient for ongoing care management follow up and provided patient with direct contact information for care management team Reviewed scheduled/upcoming provider appointments  Patient Goals: - take your medications as prescribed and refill timely - Follow up with your doctor as recommended - Eat low salt, heart  healthy meals full of fruits,vegetables, whole grains, lean protein, and limit fat, and sugars - Stay active Follow Up Plan: The patient has been provided with contact information for the care management team and has been advised to call with any health related questions or concerns.  The care management team will reach out to the patient again over the next 2 months.       Plan:The patient has been provided with contact information for the care management team and has been advised to call with any health related questions or concerns.  and The care management team will reach out to the patient again over the next 2 months. Quinn Plowman RN,BSN,CCM RN Case Manager [place] [phone]

## 2020-11-06 ENCOUNTER — Ambulatory Visit: Payer: Medicare HMO | Admitting: Adult Health

## 2020-11-13 ENCOUNTER — Ambulatory Visit (INDEPENDENT_AMBULATORY_CARE_PROVIDER_SITE_OTHER): Payer: Medicare HMO

## 2020-11-13 DIAGNOSIS — I270 Primary pulmonary hypertension: Secondary | ICD-10-CM

## 2020-11-13 DIAGNOSIS — E785 Hyperlipidemia, unspecified: Secondary | ICD-10-CM

## 2020-11-13 NOTE — Chronic Care Management (AMB) (Signed)
Chronic Care Management   CCM RN Visit Note  11/13/2020 Name: Monica Lindsey MRN: [id number] DOB: 1945/05/04  Subjective: Monica Lindsey is a 75 y.o. year old female who is a primary care patient of No primary care provider on file.. The care management team was consulted for assistance with disease management and care coordination needs.    Engaged with patient by telephone for follow up visit in response to provider referral for case management and/or care coordination services.   Consent to Services:  The patient was given information about Chronic Care Management services, agreed to services, and gave verbal consent prior to initiation of services.  Please see initial visit note for detailed documentation.   Patient agreed to services and verbal consent obtained.   Assessment: Review of patient past medical history, allergies, medications, health status, including review of consultants reports, laboratory and other test data, was performed as part of comprehensive evaluation and provision of chronic care management services.   SDOH (Social Determinants of Health) assessments and interventions performed:    CCM Care Plan  Allergies  Allergen Reactions   Demerol [Meperidine] Other (See Comments)    Makes her "looney"    Outpatient Encounter Medications as of 11/13/2020  Medication Sig   amLODipine (NORVASC) 5 MG tablet TAKE 1 TABLET DAILY. NEEDS APPT WITH NEW PCP FOR ADDITIONAL REFILLS   aspirin EC 81 MG tablet Take 81 mg by mouth daily.   Cyanocobalamin (VITAMIN B-12 PO) Place 1 tablet under the tongue daily.    Flaxseed Oil (LINSEED OIL) OIL by Does not apply route. (Patient not taking: Reported on 09/10/2020)   gabapentin (NEURONTIN) 300 MG capsule Take 1 capsule (300 mg total) by mouth at bedtime. Need Establish with new pcp (Patient not taking: Reported on 09/18/2020)   hydrochlorothiazide (HYDRODIURIL) 25 MG tablet TAKE 1 TABLET DAILY. NEEDS APPT WITH NEW PCP FOR  ADDITIONAL REFILLS   Multiple Vitamin (MULTIVITAMIN) tablet Take 1 tablet by mouth 2 (two) times daily.   Na Sulfate-K Sulfate-Mg Sulf (SUPREP BOWEL PREP KIT) 17.5-3.13-1.6 GM/177ML SOLN Take 1 kit by mouth as directed. (Patient not taking: Reported on 09/18/2020)   Omega-3 Fatty Acids (FISH OIL) 1000 MG CAPS Take 1,000 mg by mouth 2 (two) times daily.    pravastatin (PRAVACHOL) 40 MG tablet Take 1 tablet (40 mg total) by mouth daily. Needs appt with new pcp for additional refills   Vitamin D, Cholecalciferol, 25 MCG (1000 UT) TABS Take by mouth.   No facility-administered encounter medications on file as of 11/13/2020.    Patient Active Problem List   Diagnosis Date Noted   Change in bowel habits    Constipation    Polyp of sigmoid colon    Peripheral neuropathy 02/08/2019   Spasm of thoracic back muscle 10/18/2018   B12 deficiency 10/04/2018   Obesity, unspecified 05/17/2013   HTN (hypertension) 03/23/2013   HLD (hyperlipidemia) 03/23/2013   OA (osteoarthritis) 03/23/2013    Conditions to be addressed/monitored:HTN and HLD  Care Plan : Cardiovascular  Updates made by Dannielle Karvonen, RN since 11/13/2020 12:00 AM     Problem: Hypertension (Hypertension)      Long-Range Goal: Hypertension managed   Start Date: 09/18/2020  Expected End Date: 01/31/2021  This Visit's Progress: On track  Recent Progress: On track  Priority: Medium  Note:   Objective:  Last practice recorded BP readings:  BP Readings from Last 3 Encounters:  09/10/20 (!) 143/78  08/09/20 122/80  08/01/20 (!) 141/82  Current  Barriers:  Knowledge Deficits related to long term care plan for self management of hypertension/ hyperlipidemia:  Patient states she is not checking her blood pressure but states she will purchase blood pressure monitor so that she can check at home.  Patient states she does not recall having her annual wellness visit this year. She states she has seen Dr. Gena Fray for follow up since her  previous provider left the practice.  Per chart review patient is listed as not having a primary care provider.  RNCM advised patient to contact the primary care provider office to determine if Dr. Gena Fray is her new primary.  Also advised patient to schedule annual wellness visit.  Patient verbalized understanding and agreement.  Patient reports having recent colonoscopy. No concerns per patient. Patient states she has lost 15 lbs intentionally since the summer.  States she is happy about this.  Patient reports having dental issues with teeth sensitivity Lindsey and bottom teeth on left side.  She reports seeing a dentist some time ago but states she is unable to have any of the work done due to cost. RNCM offered to refer patient to care guide for dental resource information.  Patient verbally agreed.   Case Manager Clinical Goal(s):  patient will verbalize understanding of plan for hypertension management patient will attend  scheduled medical appointments patient will demonstrate improved adherence to prescribed treatment plan for hypertension as evidenced by taking all medications as prescribed, monitoring and recording blood pressure as directed, adhering to low sodium/DASH diet Interventions:  Collaboration with No primary care provider on file. regarding development and update of comprehensive plan of care as evidenced by provider attestation and co-signature Inter-disciplinary care team collaboration (see longitudinal plan of care) Evaluation of current treatment plan related to hypertension self management and patient's adherence to plan as established by provider. Provided education to patient re: stroke prevention, s/s of heart attack and stroke, DASH diet, complications of uncontrolled blood pressure Reviewed medications with patient and discussed importance of compliance Discussed plans with patient for ongoing care management follow up and provided patient with direct contact information for care  management team Reviewed scheduled/upcoming provider appointments  Advised patient to contact current primary care office and confirm new primary care provider and schedule annual wellness visit. RNCM will send referral to care guide for dental resources.  Patient Goals: - Continue to take your medications as prescribed and refill timely - Continue to follow up with your doctor as recommended ( call provider office to confirm new primary care provider and scheduled annual wellness visit) - Eat low salt, heart healthy meals full of fruits,vegetables, whole grains, lean protein, and limit fat, and sugars - Continue to stay active - Work with care guide once referral made regarding dental resource information Follow Up Plan: The patient has been provided with contact information for the care management team and has been advised to call with any health related questions or concerns.  The care management team will reach out to the patient again over the next 2 months.       Plan:The patient has been provided with contact information for the care management team and has been advised to call with any health related questions or concerns.  and The care management team will reach out to the patient again over the next 1-2 months  Quinn Plowman RN,BSN,CCM RN Case Manager [place] [phone]

## 2020-11-13 NOTE — Patient Instructions (Signed)
Visit Information:  Thank you for taking the time to speak with me today.   PATIENT GOALS:  Goals Addressed             This Visit's Progress    Track and Manage My Blood Pressure-Hypertension   On track    Timeframe:  Long-Range Goal Priority:  Medium Start Date:   09/18/2020                          Expected End Date:  02/01/1999                  Follow Up Date 01/29/2021  - Continue to take your medications as prescribed and refill timely - Continue to follow up with your doctor as recommended ( call provider office to confirm new primary care provider and scheduled annual wellness visit) - Eat low salt, heart healthy meals full of fruits,vegetables, whole grains, lean protein, and limit fat, and sugars - Continue to stay active - Work with care guide once referral made regarding dental resource information   Why is this important?   You won't feel high blood pressure, but it can still hurt your blood vessels.  High blood pressure can cause heart or kidney problems. It can also cause a stroke.  Making lifestyle changes like losing a little weight or eating less salt will help.  Checking your blood pressure at home and at different times of the day can help to control blood pressure.  If the doctor prescribes medicine remember to take it the way the doctor ordered.  Call the office if you cannot afford the medicine or if there are questions about it.             The patient verbalized understanding of instructions, educational materials, and care plan provided today and agreed to receive a mailed copy of patient instructions, educational materials, and care plan.   The patient has been provided with contact information for the care management team and has been advised to call with any health related questions or concerns.  The care management team will reach out to the patient again over the next 1-2 months    Quinn Plowman RN,BSN,CCM RN Case Manager [place] [phone]

## 2020-11-15 ENCOUNTER — Telehealth: Payer: Self-pay

## 2020-11-15 NOTE — Telephone Encounter (Signed)
   Telephone encounter was:  Successful.  11/15/2020 Name: CARMIN ALVIDREZ MRN: [id number] DOB: 22-Jan-1946  HYDIE LANGAN is a 75 y.o. year old female who is a primary care patient of No primary care provider on file. . The community resource team was consulted for assistance with Financial Difficulties related to Dental bills.  Care guide performed the following interventions: Patient provided with information about care guide support team and interviewed to confirm resource needs.  Follow Up Plan:  Care guide will follow up with patient by phone over the next few days.  [facility] management  [place], [place]  Main Phone: [phone]  E-mail: [email or web address]  Website: [email or web address]

## 2020-11-29 ENCOUNTER — Telehealth: Payer: Self-pay

## 2020-11-29 NOTE — Telephone Encounter (Signed)
   Telephone encounter was:  Unsuccessful.  11/29/2020 Name: Monica Lindsey MRN: [id number] DOB: [birth year]  Unsuccessful outbound call made today to assist with:  Financial Difficulties related to Dental  Outreach Attempt:  1st Attempt  Unable to leave voicemail on either phone, home or cell. Both phones mailboxes are full.  [facility] management  [place], [place]  Main Phone: [phone]  E-mail: [email or web address]  Website: [email or web address]

## 2020-12-02 DIAGNOSIS — E785 Hyperlipidemia, unspecified: Secondary | ICD-10-CM

## 2020-12-02 DIAGNOSIS — I270 Primary pulmonary hypertension: Secondary | ICD-10-CM

## 2020-12-06 ENCOUNTER — Telehealth: Payer: Self-pay | Admitting: *Deleted

## 2020-12-06 NOTE — Chronic Care Management (AMB) (Signed)
  Care Management   Note  12/06/2020 Name: Monica Lindsey MRN: [id number] DOB: 1945-04-03  Monica Lindsey is a 75 y.o. year old female who is a primary care patient of No primary care provider on file. and is actively engaged with the care management team. I reached out to Bluford Kaufmann by phone today to assist with re-scheduling a follow up visit with the RN Case Manager  Follow up plan: Unsuccessful telephone outreach attempt made. A HIPAA compliant phone message was left for the patient providing contact information and requesting a return call.   Julian Hy, [place] Management  Direct Dial: [phone]

## 2020-12-13 ENCOUNTER — Telehealth: Payer: Self-pay

## 2020-12-13 NOTE — Telephone Encounter (Signed)
   Telephone encounter was:  Successful.  12/13/2020 Name: Monica Lindsey MRN: [id number] DOB: [birth year]  Monica Lindsey is a 75 y.o. year old female who is a primary care patient of No primary care provider on file. . The community resource team was consulted for assistance with Financial Difficulties related to Dental bills.  Care guide performed the following interventions:  Patient advised she was looking for new dental office. I called pt regarding dental offices. She stated that she thought more about it and is content on where she goes now. However, I had already sent out mail to her providing 17 locations. If pt changes her mind again, she will now have resources on offices.  Follow Up Plan:  No further follow up planned at this time. The patient has been provided with needed resources.  [facility] management  [place], [place]  Main Phone: [phone]  E-mail: [email or web address]  Website: [email or web address]

## 2020-12-19 NOTE — Chronic Care Management (AMB) (Signed)
  Care Management   Note  12/19/2020 Name: LOUGENIA MORRISSEY MRN: [id number] DOB: Mar 07, 1945  Monica Lindsey is a 75 y.o. year old female who is a primary care patient of No primary care provider on file. and is actively engaged with the care management team. I reached out to Bluford Kaufmann by phone today to assist with re-scheduling a follow up visit with the RN Case Manager  Follow up plan: Telephone appointment with care management team member scheduled for: 03/05/2021  Julian Hy, [place], [place] Management  Direct Dial: [phone]

## 2021-01-01 DIAGNOSIS — M7541 Impingement syndrome of right shoulder: Secondary | ICD-10-CM | POA: Diagnosis not present

## 2021-01-01 DIAGNOSIS — M654 Radial styloid tenosynovitis [de Quervain]: Secondary | ICD-10-CM | POA: Diagnosis not present

## 2021-01-29 ENCOUNTER — Telehealth: Payer: Medicare HMO

## 2021-03-05 ENCOUNTER — Telehealth: Payer: Medicare HMO

## 2021-03-14 ENCOUNTER — Ambulatory Visit (INDEPENDENT_AMBULATORY_CARE_PROVIDER_SITE_OTHER): Payer: Medicare HMO | Admitting: Nurse Practitioner

## 2021-03-14 ENCOUNTER — Encounter: Payer: Self-pay | Admitting: Nurse Practitioner

## 2021-03-14 ENCOUNTER — Other Ambulatory Visit: Payer: Self-pay

## 2021-03-14 VITALS — BP 134/72 | HR 89 | Temp 97.6°F | Resp 12 | Ht 63.0 in | Wt 176.1 lb

## 2021-03-14 DIAGNOSIS — I10 Essential (primary) hypertension: Secondary | ICD-10-CM

## 2021-03-14 DIAGNOSIS — E785 Hyperlipidemia, unspecified: Secondary | ICD-10-CM | POA: Diagnosis not present

## 2021-03-14 DIAGNOSIS — M25511 Pain in right shoulder: Secondary | ICD-10-CM

## 2021-03-14 DIAGNOSIS — Z7689 Persons encountering health services in other specified circumstances: Secondary | ICD-10-CM | POA: Diagnosis not present

## 2021-03-14 DIAGNOSIS — G8929 Other chronic pain: Secondary | ICD-10-CM | POA: Diagnosis not present

## 2021-03-14 DIAGNOSIS — Z23 Encounter for immunization: Secondary | ICD-10-CM

## 2021-03-14 DIAGNOSIS — Z0000 Encounter for general adult medical examination without abnormal findings: Secondary | ICD-10-CM | POA: Insufficient documentation

## 2021-03-14 DIAGNOSIS — G609 Hereditary and idiopathic neuropathy, unspecified: Secondary | ICD-10-CM

## 2021-03-14 LAB — COMPREHENSIVE METABOLIC PANEL
ALT: 15 U/L (ref 0–35)
AST: 19 U/L (ref 0–37)
Albumin: 4.1 g/dL (ref 3.5–5.2)
Alkaline Phosphatase: 54 U/L (ref 39–117)
BUN: 22 mg/dL (ref 6–23)
CO2: 32 mEq/L (ref 19–32)
Calcium: 9.8 mg/dL (ref 8.4–10.5)
Chloride: 102 mEq/L (ref 96–112)
Creatinine, Ser: 1.08 mg/dL (ref 0.40–1.20)
GFR: 49.62 mL/min — ABNORMAL LOW (ref >60.00)
Glucose, Bld: 99 mg/dL (ref 70–99)
Potassium: 3.5 mEq/L (ref 3.5–5.1)
Sodium: 141 mEq/L (ref 135–145)
Total Bilirubin: 0.6 mg/dL (ref 0.2–1.2)
Total Protein: 7.1 g/dL (ref 6.0–8.3)

## 2021-03-14 LAB — LIPID PANEL
Cholesterol: 172 mg/dL (ref 0–200)
HDL: 58.1 mg/dL (ref >39.00)
LDL Cholesterol: 96 mg/dL (ref 0–99)
NonHDL: 114.04
Total CHOL/HDL Ratio: 3
Triglycerides: 91 mg/dL (ref 0.0–149.0)
VLDL: 18.2 mg/dL (ref 0.0–40.0)

## 2021-03-14 LAB — CBC
HCT: 42.9 % (ref 36.0–46.0)
Hemoglobin: 14.6 g/dL (ref 12.0–15.0)
MCHC: 34.1 g/dL (ref 30.0–36.0)
MCV: 87.6 fl (ref 78.0–100.0)
Platelets: 222 10*3/uL (ref 150.0–400.0)
RBC: 4.89 Mil/uL (ref 3.87–5.11)
RDW: 13.1 % (ref 11.5–15.5)
WBC: 7.2 10*3/uL (ref 4.0–10.5)

## 2021-03-14 MED ORDER — PRAVASTATIN SODIUM 40 MG PO TABS: 40.0000 mg | ORAL_TABLET | Freq: Every day | ORAL | 3 refills | Status: DC

## 2021-03-14 MED ORDER — AMLODIPINE BESYLATE 5 MG PO TABS: ORAL_TABLET | ORAL | 3 refills | Status: DC

## 2021-03-14 MED ORDER — HYDROCHLOROTHIAZIDE 25 MG PO TABS: ORAL_TABLET | ORAL | 3 refills | Status: DC

## 2021-03-14 NOTE — Assessment & Plan Note (Signed)
Historical diagnosis.  Patient was written gabapentin but takes it infrequently.  Has not been taking it recently

## 2021-03-14 NOTE — Assessment & Plan Note (Signed)
On pravastatin 40 mg.  Patient tolerates medication well.  Continue medication pending lab results

## 2021-03-14 NOTE — Progress Notes (Signed)
Established Patient Office Visit  Subjective:  Patient ID: Monica Lindsey, female    DOB: Sep 07, 1945  Age: 76 y.o. MRN: [id number]  CC:  Chief Complaint  Patient presents with   Transfer of Care    Use to see Dr C   Shoulder Pain    Right arm, has been an issue for a long time. Has had injections with [facility] in the shoulder, last injection in December 2022. Has had Xrays but no other scans. Not getting better.    HPI Monica Lindsey presents for Transfer of care  HTN: Does not check blood pressure at home. Does not have a blood pressure cuff at home. Currenlty maintained on amlodipine and HCTZ   HLD: pravastatin 83m takes it at night. Family history of heart disease. Father had CABG. Has cut out surgery. Does not exercise often due to orthopedic conditions.   Neuropathy: Feet. Constant tingling in her feet and hands. Does not take gabapentin often, states that she is scared of it. Not currently taking the gabapentin  Shoulder pain: Right shoulder. She is followed by Dr. MSabra Heckat [facility]. Saw them in November and was given a shot in the shoulder. 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  Mammogram: last yeat. [facility]. 12/2019 Colonoscopy: 09/10/2020, no recall DEXA: 0/29/2020  Past Medical History:  Diagnosis Date   Arthritis    Constipation    Frequent headaches    Hay fever    Heel spur    Hyperlipidemia    Hypertension     Past Surgical History:  Procedure Laterality Date   ABDOMINAL HYSTERECTOMY     BACK SURGERY  02/02/1981   CAR[place]ET[place]ARPAL ([place] FUS[place] TH[place]B Right 04/07/2017   Procedure: CARPOMETACARPAL ([place] FUSION OF THUMB;  Surgeon: MEarnestine Leys MD;  Location: [facility] ORS;  Service: Orthopedics;  Laterality: Right;   COLONOSCOPY     COLONOSCOPY WITH PROPOFOL N/A 09/10/2020   Procedure: COLONOSCOPY WITH PROPOFOL;  Surgeon: WLucilla Lame MD;  [facility];  Service: Endoscopy;  Laterality: N/A;   HEMORRHOID SURGERY      TOOTH EXTRACTION      Family History  Problem Relation Age of Onset   Heart disease Mother    Alcohol abuse Father    Arthritis Father    Heart disease Father    Stroke Father    Hypertension Father    Breast cancer Paternal Aunt [phone]  Breast cancer Cousin     Social History   Socioeconomic History   Marital status: Widowed    Spouse name: Not on file   Number of children: Not on file   Years of education: Not on file   Highest education level: Not on file  Occupational History   Not on file  Tobacco Use   Smoking status: Never   Smokeless tobacco: Never  Vaping Use   Vaping Use: Never used  Substance and Sexual Activity   Alcohol use: Yes    Comment: rare   Drug use: No   Sexual activity: Never  Other Topics Concern   Not on file  Social History Narrative   [place] M[place]S- [place] [employer]   Widowed.      Still working 3 days per week.  Very close to her children and grand children.      Daughter, SLarene Beach is HPOA.  Has a living will.   Would desire CPR, would not desire prolonged life support if futile.  Social Determinants of Health   Financial Resource Strain: Low Risk    Difficulty of Paying Living Expenses: Not hard at all  Food Insecurity: No Food Insecurity   Worried About Charity fundraiser in the Last Year: Never true   [place] in the Last Year: Never true  Transportation Needs: No Transportation Needs   Lack of Transportation (Medical): No   Lack of Transportation (Non-Medical): No  Physical Activity: Inactive   Days of Exercise per Week: 0 days   Minutes of Exercise per Session: 0 min  Stress: No Stress Concern Present   Feeling of Stress : Not at all  Social Connections: Moderately Integrated   Frequency of Communication with Friends and Family: More than three times a week   Frequency of Social Gatherings with Friends and Family: More than three times a week   Attends Religious Services: 1 to 4  times per year   Active Member of [employer] or Organizations: Yes   Attends Archivist Meetings: 1 to 4 times per year   Marital Status: Widowed  Human resources officer Violence: Not At Risk   Fear of Current or Ex-Partner: No   Emotionally Abused: No   Physically Abused: No   Sexually Abused: No    Outpatient Medications Prior to Visit  Medication Sig Dispense Refill   amLODipine (NORVASC) 5 MG tablet TAKE 1 TABLET DAILY. NEEDS APPT WITH NEW PCP FOR ADDITIONAL REFILLS 90 tablet 3   aspirin EC 81 MG tablet Take 81 mg by mouth daily.     Cyanocobalamin (VITAMIN B-12 PO) Place 1 tablet under the tongue daily.      gabapentin (NEURONTIN) 300 MG capsule Take 1 capsule (300 mg total) by mouth at bedtime. Need Establish with new pcp 90 capsule 3   hydrochlorothiazide (HYDRODIURIL) 25 MG tablet TAKE 1 TABLET DAILY. NEEDS APPT WITH NEW PCP FOR ADDITIONAL REFILLS 90 tablet 0   Multiple Vitamin (MULTIVITAMIN) tablet Take 1 tablet by mouth 2 (two) times daily.     Omega-3 Fatty Acids (FISH OIL) 1000 MG CAPS Take 1,000 mg by mouth 2 (two) times daily.      pravastatin (PRAVACHOL) 40 MG tablet Take 1 tablet (40 mg total) by mouth daily. Needs appt with new pcp for additional refills 90 tablet 3   Vitamin D, Cholecalciferol, 25 MCG (1000 UT) TABS Take by mouth.     Flaxseed Oil (LINSEED OIL) OIL by Does not apply route. (Patient not taking: Reported on 09/10/2020)     Na Sulfate-K Sulfate-Mg Sulf (SUPREP BOWEL PREP KIT) 17.5-3.13-1.6 GM/177ML SOLN Take 1 kit by mouth as directed. (Patient not taking: Reported on 09/18/2020) 354 mL 0   No facility-administered medications prior to visit.    Allergies  Allergen Reactions   Demerol [Meperidine] Other (See Comments)    Makes her "looney"    ROS Review of Systems  Constitutional:  Negative for chills and fever.  Eyes:  Negative for visual disturbance.  Respiratory:  Negative for cough and shortness of breath.   Cardiovascular:  Negative for chest  pain and leg swelling.  Gastrointestinal:  Negative for abdominal pain, blood in stool, diarrhea, nausea and vomiting.       BM daily  Musculoskeletal:  Positive for arthralgias.  Neurological:  Negative for headaches.     Objective:    Physical Exam Vitals and nursing note reviewed.  Constitutional:      Appearance: Normal appearance.  HENT:     Right Ear: Tympanic  membrane, ear canal and external ear normal.     Left Ear: Tympanic membrane, ear canal and external ear normal.     Mouth/Throat:     Mouth: Mucous membranes are moist.     Pharynx: Oropharynx is clear.  Eyes:     Extraocular Movements: Extraocular movements intact.     Pupils: Pupils are equal, round, and reactive to light.  Cardiovascular:     Rate and Rhythm: Normal rate and regular rhythm.     Heart sounds: Normal heart sounds.  Pulmonary:     Effort: Pulmonary effort is normal.     Breath sounds: Normal breath sounds.  Musculoskeletal:     Right shoulder: Tenderness present. No bony tenderness. Normal strength. Normal pulse.       Arms:     Right lower leg: No edema.     Left lower leg: No edema.     Comments: Negative empty can test Negative Kennedy Test  Lymphadenopathy:     Cervical: No cervical adenopathy.  Skin:    General: Skin is warm.  Neurological:     General: No focal deficit present.     Mental Status: She is alert.     Deep Tendon Reflexes:     Reflex Scores:      Bicep reflexes are 2+ on the right side and 2+ on the left side.      Patellar reflexes are 2+ on the right side and 2+ on the left side.    Comments: Bilateral upper and lower extremities 5/5    BP 134/72    Pulse 89    Temp 97.6 F (36.4 C)    Resp 12    Ht 5' 3"  (1.6 m)    Wt 176 lb 2 oz (79.9 kg)    SpO2 97%    BMI 31.20 kg/m  Wt Readings from Last 3 Encounters:  03/14/21 176 lb 2 oz (79.9 kg)  09/10/20 179 lb (81.2 kg)  08/09/20 181 lb 12.8 oz (82.5 kg)     Health Maintenance Due  Topic Date Due   Pneumonia  Vaccine 73+ Years old (2 - PPSV23 if available, else PCV20) 05/18/2014   Zoster Vaccines- Shingrix (2 of 2) 12/15/2018   INFLUENZA VACCINE  09/02/2020    There are no preventive care reminders to display for this patient.  Lab Results  Component Value Date   TSH 2.36 02/13/2019   Lab Results  Component Value Date   WBC 7.0 10/27/2019   HGB 14.7 10/27/2019   HCT 43.2 10/27/2019   MCV 88.5 10/27/2019   PLT 221.0 10/27/2019   Lab Results  Component Value Date   NA 140 10/27/2019   K 3.5 10/27/2019   CO2 29 10/27/2019   GLUCOSE 97 10/27/2019   BUN 23 10/27/2019   CREATININE 1.05 10/27/2019   BILITOT 0.5 10/27/2019   ALKPHOS 54 10/27/2019   AST 17 10/27/2019   ALT 14 10/27/2019   PROT 6.8 10/27/2019   ALBUMIN 4.4 10/27/2019   CALCIUM 9.9 10/27/2019   GFR 50.96 (L) 10/27/2019   Lab Results  Component Value Date   CHOL 181 10/27/2019   Lab Results  Component Value Date   HDL 56.70 10/27/2019   Lab Results  Component Value Date   LDLCALC 103 (H) 10/27/2019   Lab Results  Component Value Date   TRIG 105.0 10/27/2019   Lab Results  Component Value Date   CHOLHDL 3 10/27/2019   Lab Results  Component Value Date  HGBA1C 5.6 02/13/2019      Assessment & Plan:   Problem List Items Addressed This Visit       Cardiovascular and Mediastinum   Essential hypertension    Patient currently maintained on amlodipine and HCTZ.  Patient tolerates medications well.  Patient does not check blood pressure at home.  Continue medications listed before.      Relevant Medications   amLODipine (NORVASC) 5 MG tablet   hydrochlorothiazide (HYDRODIURIL) 25 MG tablet   pravastatin (PRAVACHOL) 40 MG tablet   Other Relevant Orders   CBC     Nervous and Auditory   Peripheral neuropathy    Historical diagnosis.  Patient was written gabapentin but takes it infrequently.  Has not been taking it recently        Other   HLD (hyperlipidemia)    On pravastatin 40 mg.  Patient  tolerates medication well.  Continue medication pending lab results      Relevant Medications   amLODipine (NORVASC) 5 MG tablet   hydrochlorothiazide (HYDRODIURIL) 25 MG tablet   pravastatin (PRAVACHOL) 40 MG tablet   Other Relevant Orders   Lipid panel   Comprehensive metabolic panel   Establishing care with new doctor, encounter for - Primary    Limited review of EMR performed.      Need for immunization against influenza   Relevant Orders   Flu Vaccine QUAD High Dose(Fluad) (Completed)   Chronic right shoulder pain    Sending the care of Dr. Sabra Heck from [facility].  Last time she saw them was towards the end of 2022.  Patient has had injections in the past seem to help for a limited period of time.  Exam reassuring in office today.  Did encourage patient to call Dr. Sabra Heck from Ortho to schedule a follow-up       No orders of the defined types were placed in this encounter.   Follow-up: Return in about 6 months (around 09/11/2021) for CPE with labs (40 Min appt).  This visit occurred during the SARS-CoV-2 public health emergency.  Safety protocols were in place, including screening questions prior to the visit, additional usage of staff PPE, and extensive cleaning of exam room while observing appropriate contact time as indicated for disinfecting solutions.     Romilda Garret, NP

## 2021-03-14 NOTE — Assessment & Plan Note (Signed)
Limited review of EMR performed.

## 2021-03-14 NOTE — Assessment & Plan Note (Signed)
Patient currently maintained on amlodipine and HCTZ.  Patient tolerates medications well.  Patient does not check blood pressure at home.  Continue medications listed before.

## 2021-03-14 NOTE — Assessment & Plan Note (Signed)
Sending the care of Dr. Sabra Heck from [facility].  Last time she saw them was towards the end of 2022.  Patient has had injections in the past seem to help for a limited period of time.  Exam reassuring in office today.  Did encourage patient to call Dr. Sabra Heck from Ortho to schedule a follow-up

## 2021-03-14 NOTE — Patient Instructions (Signed)
Nice to see you today Will follow up with you in 6 months for a physical, sooner if you need me I would reach out to your ortho (Dr. Sabra Heck) and let him know you are having trouble with your shoulder  I sent in your medications

## 2021-03-17 ENCOUNTER — Other Ambulatory Visit: Payer: Self-pay | Admitting: Nurse Practitioner

## 2021-03-17 NOTE — Addendum Note (Signed)
Addended by: Michela Pitcher on: 03/17/2021 04:59 PM   Modules accepted: Orders

## 2021-03-26 ENCOUNTER — Telehealth: Payer: Medicare HMO

## 2021-03-26 ENCOUNTER — Ambulatory Visit (INDEPENDENT_AMBULATORY_CARE_PROVIDER_SITE_OTHER): Payer: Medicare HMO

## 2021-03-26 DIAGNOSIS — I10 Essential (primary) hypertension: Secondary | ICD-10-CM

## 2021-03-26 DIAGNOSIS — E785 Hyperlipidemia, unspecified: Secondary | ICD-10-CM

## 2021-03-26 NOTE — Chronic Care Management (AMB) (Signed)
Chronic Care Management   CCM RN Visit Note  03/26/2021 Name: Monica Lindsey MRN: [id number] DOB: 05-10-1945  Subjective: Monica Lindsey is a 76 y.o. year old female who is a primary care patient of Michela Pitcher, NP. The care management team was consulted for assistance with disease management and care coordination needs.    Engaged with patient by telephone for follow up visit in response to provider referral for case management and/or care coordination services.   Consent to Services:  The patient was given information about Chronic Care Management services, agreed to services, and gave verbal consent prior to initiation of services.  Please see initial visit note for detailed documentation.   Patient agreed to services and verbal consent obtained.   Assessment: Review of patient past medical history, allergies, medications, health status, including review of consultants reports, laboratory and other test data, was performed as part of comprehensive evaluation and provision of chronic care management services.   SDOH (Social Determinants of Health) assessments and interventions performed:    CCM Care Plan  Allergies  Allergen Reactions   Demerol [Meperidine] Other (See Comments)    Makes her "looney"    Outpatient Encounter Medications as of 03/26/2021  Medication Sig   amLODipine (NORVASC) 5 MG tablet TAKE 1 TABLET DAILY.   aspirin EC 81 MG tablet Take 81 mg by mouth daily.   Cyanocobalamin (VITAMIN B-12 PO) Place 1 tablet under the tongue daily.    gabapentin (NEURONTIN) 300 MG capsule Take 1 capsule (300 mg total) by mouth at bedtime. Need Establish with new pcp   hydrochlorothiazide (HYDRODIURIL) 25 MG tablet TAKE 1 TABLET DAILY.   Multiple Vitamin (MULTIVITAMIN) tablet Take 1 tablet by mouth 2 (two) times daily.   Omega-3 Fatty Acids (FISH OIL) 1000 MG CAPS Take 1,000 mg by mouth 2 (two) times daily.    pravastatin (PRAVACHOL) 40 MG tablet Take 1 tablet (40 mg total)  by mouth daily.   Vitamin D, Cholecalciferol, 25 MCG (1000 UT) TABS Take by mouth.   No facility-administered encounter medications on file as of 03/26/2021.    Patient Active Problem List   Diagnosis Date Noted   Establishing care with new doctor, encounter for 03/14/2021   Need for immunization against influenza 03/14/2021   Chronic right shoulder pain 03/14/2021   Change in bowel habits    Constipation    Polyp of sigmoid colon    Peripheral neuropathy 02/08/2019   Spasm of thoracic back muscle 10/18/2018   B12 deficiency 10/04/2018   Obesity, unspecified 05/17/2013   Essential hypertension 03/23/2013   HLD (hyperlipidemia) 03/23/2013   OA (osteoarthritis) 03/23/2013    Conditions to be addressed/monitored:HTN and HLD  Care Plan : [facility] plan of care  Updates made by Dannielle Karvonen, RN since 03/26/2021 12:00 AM     Problem: chronic disease management education and care coordination needs.   Priority: High     Long-Range Goal: Development of plan of care to address chronic diseae management and care coordination needs.   Start Date: 03/26/2021  Expected End Date: 07/02/2021  Priority: High  Note:   Current Barriers:  Knowledge Deficits related to plan of care for management of HTN and HLD  Patient reports she is doing well.  Reports having follow up with her new primary care provider on 03/14/21.  Blood pressure at visit ws 134/72.   She states no change in treatment plan.  Patient states she was advised by her doctor for call her orthopedic  doctor for follow up visit regarding her right shoulder pain.  Patient states she has changed her diet to chicken, fish and vegetables.  Patient states overall she is doing well.  RNCM Clinical Goal(s):  Patient will verbalize understanding of plan for management of HTN and HLD as evidenced by patient self report and/ or notation in chart take all medications exactly as prescribed and will call provider for medication related questions as  evidenced by patient self report and / or notation in chart.     attend all scheduled medical appointments:   as evidenced by patient self report and/ or notation in chart        continue to work with RN Care Manager and/or Social Worker to address care management and care coordination needs related to HTN and HLD as evidenced by adherence to CM Team Scheduled appointments     through collaboration with RN Care manager, provider, and care team.   Interventions: 1:1 collaboration with primary care provider regarding development and update of comprehensive plan of care as evidenced by provider attestation and co-signature Inter-disciplinary care team collaboration (see longitudinal plan of care) Evaluation of current treatment plan related to  self management and patient's adherence to plan as established by provider   Hyperlipidemia Interventions:  (Status:  Goal on track:  Yes.) Long Term Goal Medication review performed; medication list updated in electronic medical record.  Counseled on importance of regular laboratory monitoring as prescribed Reviewed importance of limiting foods high in cholesterol Advised to take medications as prescribed  Hypertension Interventions:  (Status:  Goal on track:  Yes.) Long Term Goal Last practice recorded BP readings:  BP Readings from Last 3 Encounters:  03/14/21 134/72  09/10/20 (!) 143/78  08/09/20 122/80  Most recent eGFR/CrCl: No results found for: EGFR  No components found for: CRCL  Evaluation of current treatment plan related to hypertension self management and patient's adherence to plan as established by provider Reviewed medications with patient and discussed importance of compliance Discussed plans with patient for ongoing care management follow up and provided patient with direct contact information for care management team Reviewed scheduled/upcoming provider appointments  Advised to take medications as prescribed.  Advised to consider  checking blood pressure 1-2 times per month.   Patient Goals/Self-Care Activities: Take medications as prescribed   Attend all scheduled provider appointments Call pharmacy for medication refills 3-7 days in advance of running out of medications Call provider office for new concerns or questions  Consider checking blood pressure 1-2 times per month Follow a low salt, heart healthy diet.       Plan:The patient has been provided with contact information for the care management team and has been advised to call with any health related questions or concerns.  The care management team will reach out to the patient again over the next 2 months  Quinn Plowman RN,BSN,CCM RN Case Manager [place] [phone]

## 2021-03-26 NOTE — Patient Instructions (Signed)
Visit Information  Thank you for taking time to visit with me today. Please don't hesitate to contact me if I can be of assistance to you before our next scheduled telephone appointment.  Following are the goals we discussed today:  Take medications as prescribed   Attend all scheduled provider appointments Call pharmacy for medication refills 3-7 days in advance of running out of medications Call provider office for new concerns or questions  Consider checking blood pressure 1-2 times per month Follow a low salt, heart healthy diet.   Our next appointment is by telephone on 05/27/21 at 10 am  Please call the care guide team at [phone] if you need to cancel or reschedule your appointment.   If you are experiencing a Mental Health or [place] or need someone to talk to, please call the Suicide and Crisis Lifeline: 988 call 1-800-273-TALK (toll free, 24 hour hotline)   Patient verbalizes understanding of instructions and care plan provided today and agrees to view in [place]. Active MyChart status confirmed with patient.    Quinn Plowman RN,BSN,CCM RN Case Manager [place]  [phone]

## 2021-04-01 DIAGNOSIS — I10 Essential (primary) hypertension: Secondary | ICD-10-CM

## 2021-04-01 DIAGNOSIS — E785 Hyperlipidemia, unspecified: Secondary | ICD-10-CM | POA: Diagnosis not present

## 2021-05-27 ENCOUNTER — Ambulatory Visit (INDEPENDENT_AMBULATORY_CARE_PROVIDER_SITE_OTHER): Payer: Medicare HMO

## 2021-05-27 DIAGNOSIS — E785 Hyperlipidemia, unspecified: Secondary | ICD-10-CM

## 2021-05-27 DIAGNOSIS — I10 Essential (primary) hypertension: Secondary | ICD-10-CM

## 2021-05-27 NOTE — Chronic Care Management (AMB) (Signed)
?Chronic Care Management  ? ?CCM RN Visit Note ? ?05/27/2021 ?Name: Monica Lindsey MRN: [id number] DOB: 08/16/1945 ? ?Subjective: ?Monica Lindsey is a 76 y.o. year old female who is a primary care patient of Michela Pitcher, NP. The care management team was consulted for assistance with disease management and care coordination needs.   ? ?Engaged with patient by telephone for follow up visit in response to provider referral for case management and/or care coordination services.  ? ?Consent to Services:  ?The patient was given information about Chronic Care Management services, agreed to services, and gave verbal consent prior to initiation of services.  Please see initial visit note for detailed documentation.  ? ?Patient agreed to services and verbal consent obtained.  ? ?Assessment: Review of patient past medical history, allergies, medications, health status, including review of consultants reports, laboratory and other test data, was performed as part of comprehensive evaluation and provision of chronic care management services.  ? ?SDOH (Social Determinants of Health) assessments and interventions performed:   ? ?CCM Care Plan ? ?Allergies  ?Allergen Reactions  ? Demerol [Meperidine] Other (See Comments)  ?  Makes her "looney"  ? ? ?Outpatient Encounter Medications as of 05/27/2021  ?Medication Sig  ? amLODipine (NORVASC) 5 MG tablet TAKE 1 TABLET DAILY.  ? aspirin EC 81 MG tablet Take 81 mg by mouth daily.  ? Cyanocobalamin (VITAMIN B-12 PO) Place 1 tablet under the tongue daily.   ? gabapentin (NEURONTIN) 300 MG capsule Take 1 capsule (300 mg total) by mouth at bedtime. Need Establish with new pcp  ? hydrochlorothiazide (HYDRODIURIL) 25 MG tablet TAKE 1 TABLET DAILY.  ? Multiple Vitamin (MULTIVITAMIN) tablet Take 1 tablet by mouth 2 (two) times daily.  ? Omega-3 Fatty Acids (FISH OIL) 1000 MG CAPS Take 1,000 mg by mouth 2 (two) times daily.   ? pravastatin (PRAVACHOL) 40 MG tablet Take 1 tablet (40 mg total)  by mouth daily.  ? Vitamin D, Cholecalciferol, 25 MCG (1000 UT) TABS Take by mouth.  ? ?No facility-administered encounter medications on file as of 05/27/2021.  ? ? ?Patient Active Problem List  ? Diagnosis Date Noted  ? Establishing care with new doctor, encounter for 03/14/2021  ? Need for immunization against influenza 03/14/2021  ? Chronic right shoulder pain 03/14/2021  ? Change in bowel habits   ? Constipation   ? Polyp of sigmoid colon   ? Peripheral neuropathy 02/08/2019  ? Spasm of thoracic back muscle 10/18/2018  ? B12 deficiency 10/04/2018  ? Obesity, unspecified 05/17/2013  ? Essential hypertension 03/23/2013  ? HLD (hyperlipidemia) 03/23/2013  ? OA (osteoarthritis) 03/23/2013  ? ? ?Conditions to be addressed/monitored:HTN and HLD ? ?Care Plan : RNCM plan of care  ?Updates made by Dannielle Karvonen, RN since 05/27/2021 12:00 AM  ?  ? ?Problem: chronic disease management education and care coordination needs.   ?Priority: High  ?  ? ?Long-Range Goal: Development of plan of care to address chronic diseae management and care coordination needs.   ?Start Date: 03/26/2021  ?Expected End Date: 08/29/2021  ?Priority: High  ?Note:   ?Current Barriers:  ?Knowledge Deficits related to plan of care for management of HTN and HLD  ?Patient reports she is doing well.  Denies any new concerns / problems.  Confirmed with patient next follow up with primary care provider is for annual wellness visit on 10/03/21.  Patient states she is not checking her blood pressure because she has not gotten a monitor.  RNCM advised patient to contact her insurance company to determine if there is any over the counter allowance for purchase of blood pressure monitor.  ?RNCM Clinical Goal(s):  ?Patient will verbalize understanding of plan for management of HTN and HLD as evidenced by patient self report and/ or notation in chart ?take all medications exactly as prescribed and will call provider for medication related questions as evidenced by  patient self report and / or notation in chart.     ?attend all scheduled medical appointments:   as evidenced by patient self report and/ or notation in chart        ?continue to work with Consulting civil engineer and/or Social Worker to address care management and care coordination needs related to HTN and HLD as evidenced by adherence to CM Team Scheduled appointments     through collaboration with Consulting civil engineer, provider, and care team.  ? ?Interventions: ?1:1 collaboration with primary care provider regarding development and update of comprehensive plan of care as evidenced by provider attestation and co-signature ?Inter-disciplinary care team collaboration (see longitudinal plan of care) ?Evaluation of current treatment plan related to  self management and patient's adherence to plan as established by provider ? ? ?Hyperlipidemia Interventions:  (Status:  Goal on track:  Yes.) Long Term Goal ?Medication review and discussed importance of compliance.  ?Counseled on importance of regular laboratory monitoring as prescribed ?Reviewed importance of limiting foods high in cholesterol ?Advised to take medications as prescribed ? ?Hypertension Interventions:  (Status:  Goal on track:  Yes.) Long Term Goal ?Last practice recorded BP readings:  ?BP Readings from Last 3 Encounters:  ?03/14/21 134/72  ?09/10/20 (!) 143/78  ?08/09/20 122/80  ?Most recent eGFR/CrCl: No results found for: EGFR  No components found for: CRCL ? ?Evaluation of current treatment plan related to hypertension self management and patient's adherence to plan as established by provider ?Reviewed medications with patient and discussed importance of compliance ?Discussed plans with patient for ongoing care management follow up and provided patient with direct contact information for care management team ?Reviewed scheduled/upcoming provider appointments  ?Advised to take medications as prescribed.  ?Advised to follow a low salt diet.  ?Advised to consider  checking blood pressure 1-2 times per month. ( Advised patient to contact her medical insurance company to determine if there is any over the counter allowance)  ? ?Patient Goals/Self-Care Activities: ?Continue to take medications as prescribed   ?Attend all scheduled provider appointments ?Call pharmacy for medication refills 3-7 days in advance of running out of medications ?Call provider office for new concerns or questions  ?Consider checking blood pressure 1-2 times per month ?Continue to follow a low salt, heart healthy diet.  ?Contact your medical insurance company to determine if there is any over the counter allowance for blood pressure monitor.  ? ?  ? ? ?Plan:The patient has been provided with contact information for the care management team and has been advised to call with any health related questions or concerns.  ?The care management team will reach out to the patient again over the next 2-3 months. ?Quinn Plowman RN,BSN,CCM ?RN Case Manager ?[place]  ?[phone] ? ? ? ? ? ? ? ? ? ?

## 2021-05-27 NOTE — Patient Instructions (Signed)
Visit Information ? ?Thank you for taking time to visit with me today. Please don't hesitate to contact me if I can be of assistance to you before our next scheduled telephone appointment. ? ?Following are the goals we discussed today:  ?Continue to take medications as prescribed   ?Attend all scheduled provider appointments ?Call pharmacy for medication refills 3-7 days in advance of running out of medications ?Call provider office for new concerns or questions  ?Consider checking blood pressure 1-2 times per month ?Continue to follow a low salt, heart healthy diet.  ?Contact your medical insurance company to determine if there is any over the counter allowance for blood pressure monitor.  ? ? ?Our next appointment is by telephone on 08/21/21 at 10:00 am ? ?Please call the care guide team at [phone] if you need to cancel or reschedule your appointment.  ? ?If you are experiencing a Mental Health or [place] or need someone to talk to, please call the Suicide and Crisis Lifeline: 988 ?call 1-800-273-TALK (toll free, 24 hour hotline)  ? ?Patient verbalizes understanding of instructions and care plan provided today and agrees to view in [place]. Active MyChart status confirmed with patient.   ? ?Quinn Plowman RN,BSN,CCM ?RN Case Manager ?[place]  ?[phone] ? ?

## 2021-06-10 ENCOUNTER — Telehealth: Payer: Self-pay | Admitting: Nurse Practitioner

## 2021-06-10 NOTE — Telephone Encounter (Signed)
LVM for pt to rtn my call to schedule AWV with NHA. Please schedule this appt with NHA if pt calls the office.  ?

## 2021-06-19 ENCOUNTER — Telehealth: Payer: Self-pay | Admitting: *Deleted

## 2021-06-19 NOTE — Chronic Care Management (AMB) (Signed)
  Chronic Care Management Note  06/19/2021 Name: Monica Lindsey MRN: [id number] DOB: 1945/11/16  Monica Lindsey is a 76 y.o. year old female who is a primary care patient of Michela Pitcher, NP and is actively engaged with the care management team. I reached out to Bluford Kaufmann by phone today to assist with re-scheduling a follow up visit with the RN Case Manager  Follow up plan: Unsuccessful telephone outreach attempt made. A HIPAA compliant phone message was left for the patient providing contact information and requesting a return call.   Julian Hy, [place] Management  Direct Dial: [phone]

## 2021-07-11 NOTE — Chronic Care Management (AMB) (Signed)
  Chronic Care Management Note  07/11/2021 Name: Monica Lindsey MRN: [id number] DOB: Mar 22, 1945  Monica Lindsey is a 76 y.o. year old female who is a primary care patient of Michela Pitcher, NP and is actively engaged with the care management team. I reached out to Bluford Kaufmann by phone today to assist with re-scheduling a follow up visit with the RN Case Manager  Follow up plan: Unsuccessful telephone outreach attempt made. A HIPAA compliant phone message was left for the patient providing contact information and requesting a return call.   Julian Hy, [place] Management  Direct Dial: [phone]

## 2021-07-14 DIAGNOSIS — S81832A Puncture wound without foreign body, left lower leg, initial encounter: Secondary | ICD-10-CM | POA: Diagnosis not present

## 2021-07-21 NOTE — Chronic Care Management (AMB) (Signed)
  Chronic Care Management Note  07/21/2021 Name: ELOYCE BULTMAN MRN: [id number] DOB: January 13, 1946  ALVEENA TAIRA is a 76 y.o. year old female who is a primary care patient of Michela Pitcher, NP and is actively engaged with the care management team. I reached out to Bluford Kaufmann by phone today to assist with re-scheduling a follow up visit with the RN Case Manager  Follow up plan: Telephone appointment with care management team member scheduled for: 08/19/2021  Julian Hy, [place], [place] Management  Direct Dial: [phone]

## 2021-07-22 DIAGNOSIS — M778 Other enthesopathies, not elsewhere classified: Secondary | ICD-10-CM | POA: Insufficient documentation

## 2021-07-22 DIAGNOSIS — M13811 Other specified arthritis, right shoulder: Secondary | ICD-10-CM | POA: Diagnosis not present

## 2021-07-29 ENCOUNTER — Ambulatory Visit (INDEPENDENT_AMBULATORY_CARE_PROVIDER_SITE_OTHER): Payer: Medicare HMO

## 2021-07-29 DIAGNOSIS — E785 Hyperlipidemia, unspecified: Secondary | ICD-10-CM

## 2021-07-29 DIAGNOSIS — I10 Essential (primary) hypertension: Secondary | ICD-10-CM

## 2021-07-29 NOTE — Chronic Care Management (AMB) (Signed)
Chronic Care Management   CCM RN Visit Note  07/29/2021 Name: Monica Lindsey MRN: [id number] DOB: July 17, 1945  Subjective: Monica Lindsey is a 76 y.o. year old female who is a primary care patient of Michela Pitcher, NP. The care management team was consulted for assistance with disease management and care coordination needs.    Engaged with patient by telephone for follow up visit in response to provider referral for case management and/or care coordination services.   Consent to Services:  The patient was given information about Chronic Care Management services, agreed to services, and gave verbal consent prior to initiation of services.  Please see initial visit note for detailed documentation.   Patient agreed to services and verbal consent obtained.   Assessment: Review of patient past medical history, allergies, medications, health status, including review of consultants reports, laboratory and other test data, was performed as part of comprehensive evaluation and provision of chronic care management services.   SDOH (Social Determinants of Health) assessments and interventions performed:    CCM Care Plan  Allergies  Allergen Reactions   Demerol [Meperidine] Other (See Comments)    Makes her "looney"    Outpatient Encounter Medications as of 07/29/2021  Medication Sig   amLODipine (NORVASC) 5 MG tablet TAKE 1 TABLET DAILY.   aspirin EC 81 MG tablet Take 81 mg by mouth daily.   Cyanocobalamin (VITAMIN B-12 PO) Place 1 tablet under the tongue daily.    gabapentin (NEURONTIN) 300 MG capsule Take 1 capsule (300 mg total) by mouth at bedtime. Need Establish with new pcp   hydrochlorothiazide (HYDRODIURIL) 25 MG tablet TAKE 1 TABLET DAILY.   Multiple Vitamin (MULTIVITAMIN) tablet Take 1 tablet by mouth 2 (two) times daily.   Omega-3 Fatty Acids (FISH OIL) 1000 MG CAPS Take 1,000 mg by mouth 2 (two) times daily.    pravastatin (PRAVACHOL) 40 MG tablet Take 1 tablet (40 mg total)  by mouth daily.   Vitamin D, Cholecalciferol, 25 MCG (1000 UT) TABS Take by mouth.   No facility-administered encounter medications on file as of 07/29/2021.    Patient Active Problem List   Diagnosis Date Noted   Establishing care with new doctor, encounter for 03/14/2021   Need for immunization against influenza 03/14/2021   Chronic right shoulder pain 03/14/2021   Change in bowel habits    Constipation    Polyp of sigmoid colon    Peripheral neuropathy 02/08/2019   Spasm of thoracic back muscle 10/18/2018   B12 deficiency 10/04/2018   Obesity, unspecified 05/17/2013   Essential hypertension 03/23/2013   HLD (hyperlipidemia) 03/23/2013   OA (osteoarthritis) 03/23/2013    Conditions to be addressed/monitored:HTN and HLD  Care Plan : [facility] plan of care  Updates made by Dannielle Karvonen, RN since 07/29/2021 12:00 AM     Problem: chronic disease management education and care coordination needs.   Priority: High     Long-Range Goal: Development of plan of care to address chronic diseae management and care coordination needs.   Start Date: 03/26/2021  Expected End Date: 08/29/2021  Priority: High  Note:   Goal met.  Case closed Current Barriers:  Knowledge Deficits related to plan of care for management of HTN and HLD  Patient reports she is doing well.  Denies any new concerns / problems.  Discussed closing case.  Patient verbally agreed to case closure.    RNCM Clinical Goal(s):  Patient will verbalize understanding of plan for management of HTN and HLD  as evidenced by patient self report and/ or notation in chart take all medications exactly as prescribed and will call provider for medication related questions as evidenced by patient self report and / or notation in chart.     attend all scheduled medical appointments:   as evidenced by patient self report and/ or notation in chart        continue to work with RN Care Manager and/or Social Worker to address care management and  care coordination needs related to HTN and HLD as evidenced by adherence to CM Team Scheduled appointments     through collaboration with RN Care manager, provider, and care team.   Interventions: 1:1 collaboration with primary care provider regarding development and update of comprehensive plan of care as evidenced by provider attestation and co-signature Inter-disciplinary care team collaboration (see longitudinal plan of care) Evaluation of current treatment plan related to  self management and patient's adherence to plan as established by provider   Hyperlipidemia Interventions:  (Status:  Goal Met.) Long Term Goal Medication review and discussed importance of compliance.  Counseled on importance of regular laboratory monitoring as prescribed Reviewed importance of limiting foods high in cholesterol Advised to take medications as prescribed  Hypertension Interventions:  (Status:  Goal Met.) Long Term Goal Last practice recorded BP readings:  BP Readings from Last 3 Encounters:  03/14/21 134/72  09/10/20 (!) 143/78  08/09/20 122/80  Most recent eGFR/CrCl: No results found for: EGFR  No components found for: CRCL  Evaluation of current treatment plan related to hypertension self management and patient's adherence to plan as established by provider Reviewed medications with patient and discussed importance of compliance Discussed plans with patient for ongoing care management follow up and provided patient with direct contact information for care management team Reviewed scheduled/upcoming provider appointments  Advised to take medications as prescribed.  Advised to follow a low salt diet.  Advised to consider checking blood pressure 1-2 times per month. ( Advised patient to contact her medical insurance company to determine if there is any over the counter allowance)   Patient Goals/Self-Care Activities: Continue to take medications as prescribed   Attend all scheduled provider  appointments Call pharmacy for medication refills 3-7 days in advance of running out of medications Call provider office for new concerns or questions  Consider checking blood pressure 1-2 times per month Continue to follow a low salt, heart healthy diet.  Contact your medical insurance company to determine if there is any over the counter allowance for blood pressure monitor.       Plan:No further follow up required: Goals met. Case closed Quinn Plowman RN,BSN,CCM RN Case Manager [place]  [phone]

## 2021-08-01 ENCOUNTER — Ambulatory Visit (INDEPENDENT_AMBULATORY_CARE_PROVIDER_SITE_OTHER): Payer: Medicare HMO

## 2021-08-01 VITALS — Ht 63.0 in | Wt 176.0 lb

## 2021-08-01 DIAGNOSIS — Z0000 Encounter for general adult medical examination without abnormal findings: Secondary | ICD-10-CM

## 2021-08-01 DIAGNOSIS — E785 Hyperlipidemia, unspecified: Secondary | ICD-10-CM

## 2021-08-01 DIAGNOSIS — I10 Essential (primary) hypertension: Secondary | ICD-10-CM

## 2021-08-01 NOTE — Progress Notes (Signed)
Subjective:   Monica Lindsey is a 76 y.o. female who presents for Medicare Annual (Subsequent) preventive examination.  Review of Systems    Virtual Visit via Telephone Note  I connected with  Monica Lindsey on 08/01/21 at 11:30 AM EDT by telephone and verified that I am speaking with the correct person using two identifiers.  Location: Patient: Home Provider: Office Persons participating in the virtual visit: patient/Nurse Health Advisor   I discussed the limitations, risks, security and privacy concerns of performing an evaluation and management service by telephone and the availability of in person appointments. The patient expressed understanding and agreed to proceed.  Interactive audio and video telecommunications were attempted between this nurse and patient, however failed, due to patient having technical difficulties OR patient did not have access to video capability.  We continued and completed visit with audio only.  Some vital signs may be absent or patient reported.   Criselda Peaches, LPN  Cardiac Risk Factors include: advanced age (>11mn, >>49women);hypertension     Objective:    Today's Vitals   08/01/21 1127  Weight: 176 lb (79.8 kg)  Height: '5\' 3"'$  (1.6 m)   Body mass index is 31.18 kg/m.     08/01/2021   11:38 AM 09/18/2020    2:10 PM 09/10/2020    9:28 AM 03/26/2020   10:41 AM 04/20/2018   11:32 AM 03/31/2017    4:41 PM 07/09/2016   11:21 AM  Advanced Directives  Does Patient Have a Medical Advance Directive? Yes Yes Yes Yes Yes Yes Yes  Type of AParamedicof [place]ependenceLiving will [place]Living will HTselakai DezzaLiving will HJeff[place]Living will HPort OrchardLiving will HPort EdwardsLiving will HOstr[place]Living will  Does patient want to make changes to medical advance directive? No - Patient declined No - Patient declined    No - Patient declined    Copy of [place]unit[place] Chart? No - copy requested   No - copy requested No - copy requested  No - copy requested    Current Medications (verified) Outpatient Encounter Medications as of 08/01/2021  Medication Sig   amLODipine (NORVASC) 5 MG tablet TAKE 1 TABLET DAILY.   aspirin EC 81 MG tablet Take 81 mg by mouth daily.   Cyanocobalamin (VITAMIN B-12 PO) Place 1 tablet under the tongue daily.    gabapentin (NEURONTIN) 300 MG capsule Take 1 capsule (300 mg total) by mouth at bedtime. Need Establish with new pcp   hydrochlorothiazide (HYDRODIURIL) 25 MG tablet TAKE 1 TABLET DAILY.   Multiple Vitamin (MULTIVITAMIN) tablet Take 1 tablet by mouth 2 (two) times daily.   Omega-3 Fatty Acids (FISH OIL) 1000 MG CAPS Take 1,000 mg by mouth 2 (two) times daily.    pravastatin (PRAVACHOL) 40 MG tablet Take 1 tablet (40 mg total) by mouth daily.   Vitamin D, Cholecalciferol, 25 MCG (1000 UT) TABS Take by mouth.   No facility-administered encounter medications on file as of 08/01/2021.    Allergies (verified) Demerol [meperidine]   History: Past Medical History:  Diagnosis Date   Arthritis    Constipation    Frequent headaches    Hay fever    Heel spur    Hyperlipidemia    Hypertension    Past Surgical History:  Procedure Laterality Date   ABDOMINAL HYSTERECTOMY     parialt ovaries intact   BACK SURGERY  02/02/1981  CARPOMETACARPAL ([place]) FUSION OF THUMB Right 04/07/2017   Procedure: CARPOMETACARPAL [facility]) FUSION OF THUMB;  Surgeon: Earnestine Leys, MD;  Location: [facility] ORS;  Service: Orthopedics;  Laterality: Right;   COLONOSCOPY     COLONOSCOPY WITH PROPOFOL N/A 09/10/2020   Procedure: COLONOSCOPY WITH PROPOFOL;  Surgeon: [name], MD;  Location[facility] END[facility]COPY;  Service: Endoscopy;  Laterality: N/A;   HEMORRHOID SURGERY     TOOTH EXTRACTION     Family History  Problem Relation Age of Onset   Heart disease Mother    Alcohol abuse Father     Arthritis Father    Heart disease Father    Stroke Father    Hypertension Father    Brain cancer Sister    Breast cancer Paternal Aunt 101   Breast cancer Cousin    Social History   Socioeconomic History   Marital status: Widowed    Spouse name: Not on file   Number of children: Not on file   Years of education: Not on file   Highest education level: Not on file  Occupational History   Not on file  Tobacco Use   Smoking status: Never   Smokeless tobacco: Never  Vaping Use   Vaping Use: Never used  Substance and Sexual Activity   Alcohol use: Yes    Comment: rare   Drug use: No   Sexual activity: Never  Other Topics Concern   Not on file  Social History Narrative   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   Widowed.      Still working 3 days per week.  Very close to her children and grand children.      Daughter, [name], is HPOA.  Has a living will.   Would desire CPR, would not desire prolonged life support if futile.            Social Determinants of Health   Financial Resource Strain: Low Risk  (08/01/2021)   Overall Financial Resource Strain (CARDIA)    Difficulty of Paying Living Expenses: Not hard at all  Food Insecurity: No Food Insecurity (08/01/2021)   Hunger Vital Sign    Worried About Running Out of Food in the Last Year: Never true    Ran Out of Food in the Last Year: Never true  Transportation Needs: No Transportation Needs (08/01/2021)   PRAPARE - Hydrologist (Medical): No    Lack of Transportation (Non-Medical): No  Physical Activity: Insufficiently Active (08/01/2021)   Exercise Vital Sign    Days of Exercise per Week: 2 days    Minutes of Exercise per Session: 30 min  Stress: No Stress Concern Present (08/01/2021)   [place]    Feeling of Stress : Not at all  Social Connections: Moderately Integrated (08/01/2021)   Social  Connection and Isolation Panel [NHANES]    Frequency of Communication with Friends and Family: More than three times a week    Frequency of Social Gatherings with Friends and Family: More than three times a week    Attends Religious Services: More than 4 times per year    Active Member of [employer] or Organizations: Yes    Attends Archivist Meetings: More than 4 times per year    Marital Status: Widowed   Clinical Intake: How often do you need to have someone help you when you read instructions, pamphlets, or other written materials from your doctor or  pharmacy?: 1 - Never  Diabetic?  No  Activities of Daily Living    08/01/2021   11:36 AM  In your present state of health, do you have any difficulty performing the following activities:  Hearing? 0  Vision? 0  Difficulty concentrating or making decisions? 0  Walking or climbing stairs? 0  Dressing or bathing? 0  Doing errands, shopping? 0  Preparing Food and eating ? N  Using the Toilet? N  In the past six months, have you accidently leaked urine? N  Do you have problems with loss of bowel control? N  Managing your Medications? N  Managing your Finances? N  Housekeeping or managing your Housekeeping? N    Patient Care Team: Michela Pitcher, NP as PCP - General (Pain Medicine) Oh, Lupita Dawn, MD (Inactive) as Physician Assistant (Internal Medicine) Barrie Dunker, MD as Counselor (Dermatology)  Indicate any recent Medical Services you may have received from other than [facility] providers in the past year (date may be approximate).     Assessment:   This is a routine wellness examination for [place].  Hearing/Vision screen Hearing Screening - Comments:: No hearing difficulty Vision Screening - Comments:: Wears reading glasses. Followed by [place] issues and exercise activities discussed: Exercise limited by: None identified   Goals Addressed               This Visit's Progress     Stay  healthy (pt-stated)        Continue to spend time with  my family       Depression Screen    08/01/2021   11:33 AM 09/18/2020    2:15 PM 03/26/2020   10:48 AM 10/27/2019    8:20 AM 04/20/2018   11:36 AM 07/19/2017    8:55 AM 07/09/2016   11:20 AM  PHQ 2/9 Scores  PHQ - 2 Score 0 0 0 1 0 0 0    Fall Risk    08/01/2021   11:36 AM 09/18/2020    2:15 PM 03/26/2020   10:45 AM 10/27/2019    8:20 AM 10/05/2018   10:50 AM  Fall Risk   Falls in the past year? 0 0 1  0  Number falls in past yr: 0 0 1 1   Injury with Fall? 0  0 0   Risk for fall due to : No Fall Risks  History of fall(s)    Follow up   Falls prevention discussed  Falls evaluation completed    FALL RISK PREVENTION PERTAINING TO THE HOME:  Any stairs in or around the home? Yes If so, are there any without handrails? No  Home free of loose throw rugs in walkways, pet beds, electrical cords, etc? Yes  Adequate lighting in your home to reduce risk of falls? Yes   ASSISTIVE DEVICES UTILIZED TO PREVENT FALLS:  Life alert? No  Use of a cane, walker or w/c? No  Grab bars in the bathroom? Yes  Shower chair or bench in shower? Yes  Elevated toilet seat or a handicapped toilet? Yes   TIMED UP AND GO:  Was the test performed? No . Audio Visit  Cognitive Function:    04/20/2018   11:38 AM 07/09/2016   11:15 AM  MMSE - Mini Mental State Exam  Orientation to time 5 5  Orientation to Place 5 5  Registration 3 3  Attention/ Calculation 5 0  Recall 3 3  Language- name 2 objects 2 0  Language- repeat 1 1  Language- follow 3 step command 3 3  Language- read & follow direction 1 0  Write a sentence 1 0  Copy design 1 0  Total score 30 20        08/01/2021   11:39 AM  6CIT Screen  What Year? 0 points  What month? 0 points  What time? 0 points  Count back from 20 0 points  Months in reverse 0 points  Repeat phrase 0 points  Total Score 0 points    Immunizations Immunization History  Administered Date(s)  Administered   DTaP 10/27/2007   Fluad Quad(high Dose 65+) 10/05/2018, 10/27/2019, 03/14/2021   Influenza, Seasonal, Injecte, Preservative Fre 03/06/2009, 02/10/2011, 11/24/2011   Influenza,inj,Quad PF,6+ Mos 12/06/2013, 10/10/2014, 11/27/2015, 12/01/2016   Influenza,inj,quad, With Preservative 11/02/2017   PFIZER(Purple Top)SARS-COV-2 Vaccination 02/09/2019, 03/02/2019, 11/07/2019   Pfizer Covid-19 Vaccine Bivalent Booster 62yr & up 08/28/2020   Pneumococcal Conjugate-13 05/17/2013   Pneumococcal-Unspecified 03/06/2009   Tdap 10/27/2007   Zoster Recombinat (Shingrix) 10/20/2018, 10/20/2018   Zoster, Live 10/27/2007    TDAP status: Up to date  Flu Vaccine status: Up to date  Pneumococcal vaccine status: Up to date  Covid-19 vaccine status: Completed vaccines  Qualifies for Shingles Vaccine? Yes   Zostavax completed Yes   Shingrix Completed?: Yes  Screening Tests Health Maintenance  Topic Date Due   Zoster Vaccines- Shingrix (2 of 2) 12/15/2018   Pneumonia Vaccine 78 Years old (2 - PPSV23 if available, else PCV20) 08/02/2022 (Originally 05/18/2014)   TETANUS/TDAP  07/10/2026 (Originally 10/26/2017)   INFLUENZA VACCINE  09/02/2021   DEXA SCAN  Completed   COVID-19 Vaccine  Completed   Hepatitis C Screening  Completed   HPV VACCINES  Aged Out   COLONOSCOPY (Pts 45-457yrInsurance coverage will need to be confirmed)  Discontinued    Health Maintenance  Health Maintenance Due  Topic Date Due   Zoster Vaccines- Shingrix (2 of 2) 12/15/2018    Colorectal cancer screening: Type of screening: Colonoscopy. Completed 09/11/10. Repeat every   years  Mammogram status: No longer required due to age.  Bone Density status: Completed 12/01/18. Results reflect: Bone density results: OSTEOPOROSIS. Repeat every   years.  Lung Cancer Screening: (Low Dose CT Chest recommended if Age 78-80ears, 30 pack-year currently smoking OR have quit w/in 15years.) does not qualify.     Additional Screening:  Hepatitis C Screening: does qualify; Completed 07/09/16  Vision Screening: Recommended annual ophthalmology exams for early detection of glaucoma and other disorders of the eye. Is the patient up to date with their annual eye exam?  Yes  Who is the provider or what is the name of the office in which the patient attends annual eye exams? [place] pt is not established with a provider, would they like to be referred to a provider to establish care? No .   Dental Screening: Recommended annual dental exams for proper oral hygiene  Community Resource Referral / Chronic Care Management:  CRR required this visit?  No   CCM required this visit?  No      Plan:     I have personally reviewed and noted the following in the patient's chart:   Medical and social history Use of alcohol, tobacco or illicit drugs  Current medications and supplements including opioid prescriptions.  Functional ability and status Nutritional status Physical activity Advanced directives List of other physicians Hospitalizations, surgeries, and ER visits in previous 12 months Vitals Screenings to  include cognitive, depression, and falls Referrals and appointments  In addition, I have reviewed and discussed with patient certain preventive protocols, quality metrics, and best practice recommendations. A written personalized care plan for preventive services as well as general preventive health recommendations were provided to patient.     Criselda Peaches, LPN   10/08/8932   Nurse Notes: None

## 2021-08-01 NOTE — Patient Instructions (Addendum)
Monica Lindsey , Thank you for taking time to come for your Medicare Wellness Visit. I appreciate your ongoing commitment to your health goals. Please review the following plan we discussed and let me know if I can assist you in the future.   These are the goals we discussed:  Goals       Maintain a healthy diet      Stay healthy (pt-stated)      Continue to spend time with  my family        This is a list of the screening recommended for you and due dates:  Health Maintenance  Topic Date Due   Zoster (Shingles) Vaccine (2 of 2) 12/15/2018   Pneumonia Vaccine (2 - PPSV23 if available, else PCV20) 08/02/2022*   Tetanus Vaccine  07/10/2026*   Flu Shot  09/02/2021   DEXA scan (bone density measurement)  Completed   COVID-19 Vaccine  Completed   Hepatitis C Screening: USPSTF Recommendation to screen - Ages 33-79 yo.  Completed   HPV Vaccine  Aged Out   Colon Cancer Screening  Discontinued  *Topic was postponed. The date shown is not the original due date.     Advanced directives: Yes  Conditions/risks identified: None  Next appointment: Follow up in one year for your annual wellness visit     Preventive Care 65 Years and Older, Female Preventive care refers to lifestyle choices and visits with your health care provider that can promote health and wellness. What does preventive care include? A yearly physical exam. This is also called an annual well check. Dental exams once or twice a year. Routine eye exams. Ask your health care provider how often you should have your eyes checked. Personal lifestyle choices, including: Daily care of your teeth and gums. Regular physical activity. Eating a healthy diet. Avoiding tobacco and drug use. Limiting alcohol use. Practicing safe sex. Taking low-dose aspirin every day. Taking vitamin and mineral supplements as recommended by your health care provider. What happens during an annual well check? The services and screenings done by  your health care provider during your annual well check will depend on your age, overall health, lifestyle risk factors, and family history of disease. Counseling  Your health care provider may ask you questions about your: Alcohol use. Tobacco use. Drug use. Emotional well-being. Home and relationship well-being. Sexual activity. Eating habits. History of falls. Memory and ability to understand (cognition). Work and work Statistician. Reproductive health. Screening  You may have the following tests or measurements: Height, weight, and BMI. Blood pressure. Lipid and cholesterol levels. These may be checked every 5 years, or more frequently if you are over 71 years old. Skin check. Lung cancer screening. You may have this screening every year starting at age 63 if you have a 30-pack-year history of smoking and currently smoke or have quit within the past 15 years. Fecal occult blood test (FOBT) of the stool. You may have this test every year starting at age 65. Flexible sigmoidoscopy or colonoscopy. You may have a sigmoidoscopy every 5 years or a colonoscopy every 10 years starting at age 103. Hepatitis C blood test. Hepatitis B blood test. Sexually transmitted disease (STD) testing. Diabetes screening. This is done by checking your blood sugar (glucose) after you have not eaten for a while (fasting). You may have this done every 1-3 years. Bone density scan. This is done to screen for osteoporosis. You may have this done starting at age 16. Mammogram. This may be done  every 1-2 years. Talk to your health care provider about how often you should have regular mammograms. Talk with your health care provider about your test results, treatment options, and if necessary, the need for more tests. Vaccines  Your health care provider may recommend certain vaccines, such as: Influenza vaccine. This is recommended every year. Tetanus, diphtheria, and acellular pertussis (Tdap, Td) vaccine. You  may need a Td booster every 10 years. Zoster vaccine. You may need this after age 8. Pneumococcal 13-valent conjugate (PCV13) vaccine. One dose is recommended after age 59. Pneumococcal polysaccharide (PPSV23) vaccine. One dose is recommended after age 41. Talk to your health care provider about which screenings and vaccines you need and how often you need them. This information is not intended to replace advice given to you by your health care provider. Make sure you discuss any questions you have with your health care provider. Document Released: 02/15/2015 Document Revised: 10/09/2015 Document Reviewed: 11/20/2014 Elsevier Interactive Patient Education  2017 [place] Prevention in the Home Falls can cause injuries. They can happen to people of all ages. There are many things you can do to make your home safe and to help prevent falls. What can I do on the outside of my home? Regularly fix the edges of walkways and driveways and fix any cracks. Remove anything that might make you trip as you walk through a door, such as a raised step or threshold. Trim any bushes or trees on the path to your home. Use bright outdoor lighting. Clear any walking paths of anything that might make someone trip, such as rocks or tools. Regularly check to see if handrails are loose or broken. Make sure that both sides of any steps have handrails. Any raised decks and porches should have guardrails on the edges. Have any leaves, snow, or ice cleared regularly. Use sand or salt on walking paths during winter. Clean up any spills in your garage right away. This includes oil or grease spills. What can I do in the bathroom? Use night lights. Install grab bars by the toilet and in the tub and shower. Do not use towel bars as grab bars. Use non-skid mats or decals in the tub or shower. If you need to sit down in the shower, use a plastic, non-slip stool. Keep the floor dry. Clean up any water that spills  on the floor as soon as it happens. Remove soap buildup in the tub or shower regularly. Attach bath mats securely with double-sided non-slip rug tape. Do not have throw rugs and other things on the floor that can make you trip. What can I do in the bedroom? Use night lights. Make sure that you have a light by your bed that is easy to reach. Do not use any sheets or blankets that are too big for your bed. They should not hang down onto the floor. Have a firm chair that has side arms. You can use this for support while you get dressed. Do not have throw rugs and other things on the floor that can make you trip. What can I do in the kitchen? Clean up any spills right away. Avoid walking on wet floors. Keep items that you use a lot in easy-to-reach places. If you need to reach something above you, use a strong step stool that has a grab bar. Keep electrical cords out of the way. Do not use floor polish or wax that makes floors slippery. If you must use wax, use  non-skid floor wax. Do not have throw rugs and other things on the floor that can make you trip. What can I do with my stairs? Do not leave any items on the stairs. Make sure that there are handrails on both sides of the stairs and use them. Fix handrails that are broken or loose. Make sure that handrails are as long as the stairways. Check any carpeting to make sure that it is firmly attached to the stairs. Fix any carpet that is loose or worn. Avoid having throw rugs at the top or bottom of the stairs. If you do have throw rugs, attach them to the floor with carpet tape. Make sure that you have a light switch at the top of the stairs and the bottom of the stairs. If you do not have them, ask someone to add them for you. What else can I do to help prevent falls? Wear shoes that: Do not have high heels. Have rubber bottoms. Are comfortable and fit you well. Are closed at the toe. Do not wear sandals. If you use a stepladder: Make  sure that it is fully opened. Do not climb a closed stepladder. Make sure that both sides of the stepladder are locked into place. Ask someone to hold it for you, if possible. Clearly mark and make sure that you can see: Any grab bars or handrails. First and last steps. Where the edge of each step is. Use tools that help you move around (mobility aids) if they are needed. These include: Canes. Walkers. Scooters. Crutches. Turn on the lights when you go into a dark area. Replace any light bulbs as soon as they burn out. Set up your furniture so you have a clear path. Avoid moving your furniture around. If any of your floors are uneven, fix them. If there are any pets around you, be aware of where they are. Review your medicines with your doctor. Some medicines can make you feel dizzy. This can increase your chance of falling. Ask your doctor what other things that you can do to help prevent falls. This information is not intended to replace advice given to you by your health care provider. Make sure you discuss any questions you have with your health care provider. Document Released: 11/15/2008 Document Revised: 06/27/2015 Document Reviewed: 02/23/2014 Elsevier Interactive Patient Education  2017 [employer].

## 2021-08-19 ENCOUNTER — Telehealth: Payer: Medicare HMO

## 2021-08-21 ENCOUNTER — Telehealth: Payer: Medicare HMO

## 2021-08-29 ENCOUNTER — Telehealth: Payer: Self-pay | Admitting: Nurse Practitioner

## 2021-08-29 NOTE — Telephone Encounter (Signed)
Left message to call back  

## 2021-08-29 NOTE — Telephone Encounter (Signed)
Any recommendations for the patient? When should she test herself if at all?

## 2021-08-29 NOTE — Telephone Encounter (Signed)
If she starts to have symptoms or on day 3-5 after the exposure if she is concerned

## 2021-08-29 NOTE — Telephone Encounter (Signed)
Patient called and said that she was exposed to covid yesterday, she said her daughter just tested positive for covid this morning. She said she hasnt had any symptoms but that her daughter just wanted her to let her PCP know.

## 2021-09-01 NOTE — Telephone Encounter (Signed)
Spoke with patient today. Patient states she tested positive for Covid on 08/31/21, sx started 08/30/21. Runny nose, cough (better now), sore throat, fatigue, had a lot of head pressure. No fever. No SOB or chest pain. Patient is starting to feel better as of today. She has been taking Aleve, Tylenol extra strength, and anti-histamine. She is able to drink and eat ok with small amount.

## 2021-09-01 NOTE — Telephone Encounter (Signed)
Is she interested in an antiviral. If so can we get her scheduled for a virtual visit please

## 2021-09-01 NOTE — Telephone Encounter (Signed)
Called both numbers and left a message to call back

## 2021-09-02 ENCOUNTER — Encounter: Payer: Self-pay | Admitting: Family Medicine

## 2021-09-02 ENCOUNTER — Telehealth (INDEPENDENT_AMBULATORY_CARE_PROVIDER_SITE_OTHER): Payer: Medicare HMO | Admitting: Family Medicine

## 2021-09-02 VITALS — Ht 63.0 in | Wt 175.0 lb

## 2021-09-02 DIAGNOSIS — U071 COVID-19: Secondary | ICD-10-CM

## 2021-09-02 DIAGNOSIS — E785 Hyperlipidemia, unspecified: Secondary | ICD-10-CM | POA: Diagnosis not present

## 2021-09-02 DIAGNOSIS — I10 Essential (primary) hypertension: Secondary | ICD-10-CM

## 2021-09-02 MED ORDER — MOLNUPIRAVIR 200 MG PO CAPS: 4.0000 | ORAL_CAPSULE | Freq: Two times a day (BID) | ORAL | 0 refills | Status: AC

## 2021-09-02 NOTE — Telephone Encounter (Signed)
Called patient and her daughter left message. Need to know if patient would like to try antiviral medication for Covid or no? If yes then need virtual visit

## 2021-09-02 NOTE — Telephone Encounter (Signed)
Did not receive a message where patient called but Dr Jerline Pain had VV with patient today. Per chart.

## 2021-09-02 NOTE — Progress Notes (Signed)
   Monica Lindsey is a 76 y.o. female who presents today for a virtual office visit.  Assessment/Plan:  New/Acute Problems: COVID No red flags but she is at increased risk for complication from covid. Would be reasonable to start antivirals at this point. She is on a few medications that interact with paxlovid. Will start molnupiravir. She can continue over the counter medications. We discussed reasons to return to care. Follow up as needed.   Chronic Problems Addressed Today: Dyslipidemia Doing well on pravastatin.   Hypertension Typically well controlled on norvasc '5mg'$  and HCTZ 25. Increases her risk for complication from COVID 19.     Subjective:  HPI:  Patient here with covid. Symptoms started 3 days ago. Symptoms include sore throat, nasal drainage, cough, and headaches/pressure. She has tried otc medications including aleve and nasal spray with modest improvement. No fevers or chills. No chest pain or shortness of breath.        Objective/Observations  Physical Exam: Gen: NAD, resting comfortably Pulm: Normal work of breathing Neuro: Grossly normal, moves all extremities Psych: Normal affect and thought content  Virtual Visit via Video   I connected with Monica Lindsey on 09/02/21 at  2:40 PM EDT by a video enabled telemedicine application and verified that I am speaking with the correct person using two identifiers. The limitations of evaluation and management by telemedicine and the availability of in person appointments were discussed. The patient expressed understanding and agreed to proceed.   Patient location: Home Provider location: [place] participating in the virtual visit: Myself, Patient, and her daughter     Algis Greenhouse. Jerline Pain, MD 09/02/2021 3:01 PM

## 2021-09-03 NOTE — Telephone Encounter (Signed)
noted 

## 2021-09-15 ENCOUNTER — Ambulatory Visit (INDEPENDENT_AMBULATORY_CARE_PROVIDER_SITE_OTHER): Payer: Medicare HMO | Admitting: Nurse Practitioner

## 2021-09-15 ENCOUNTER — Encounter: Payer: Self-pay | Admitting: Nurse Practitioner

## 2021-09-15 VITALS — BP 136/78 | HR 77 | Temp 97.0°F | Resp 12 | Ht 63.0 in | Wt 177.1 lb

## 2021-09-15 DIAGNOSIS — J069 Acute upper respiratory infection, unspecified: Secondary | ICD-10-CM

## 2021-09-15 DIAGNOSIS — H6121 Impacted cerumen, right ear: Secondary | ICD-10-CM | POA: Diagnosis not present

## 2021-09-15 DIAGNOSIS — R052 Subacute cough: Secondary | ICD-10-CM | POA: Diagnosis not present

## 2021-09-15 DIAGNOSIS — R051 Acute cough: Secondary | ICD-10-CM | POA: Insufficient documentation

## 2021-09-15 DIAGNOSIS — R0982 Postnasal drip: Secondary | ICD-10-CM

## 2021-09-15 DIAGNOSIS — J3489 Other specified disorders of nose and nasal sinuses: Secondary | ICD-10-CM | POA: Insufficient documentation

## 2021-09-15 MED ORDER — FLUTICASONE PROPIONATE 50 MCG/ACT NA SUSP
2.0000 | Freq: Every day | NASAL | 0 refills | Status: DC
Start: 2021-09-15 — End: 2021-10-03

## 2021-09-15 MED ORDER — DOXYCYCLINE HYCLATE 100 MG PO TABS: 100.0000 mg | ORAL_TABLET | Freq: Two times a day (BID) | ORAL | 0 refills | Status: AC

## 2021-09-15 MED ORDER — CETIRIZINE HCL 10 MG PO TABS: 10.0000 mg | ORAL_TABLET | Freq: Every day | ORAL | 11 refills | Status: AC

## 2021-09-15 NOTE — Assessment & Plan Note (Signed)
Given length of time with illness and physical exam we will treat with doxycycline 100 mg twice daily for 7 days.  Defer chest x-ray at this juncture.  Follow-up if no improvement

## 2021-09-15 NOTE — Assessment & Plan Note (Signed)
Has been going on approximately 3 weeks.  Has improved slightly in nature but still present and annoying.  Patient diagnosed with COVID 3+ weeks ago.  Daughter diagnosed with same thing recently went and given antibiotic and seems to be beneficial

## 2021-09-15 NOTE — Assessment & Plan Note (Signed)
Verbal consent obtained.  Ear was prepped with cerumen softening eardrops.  Patient's ear was irrigated.  Patient tolerated procedure well

## 2021-09-15 NOTE — Patient Instructions (Signed)
Nice to see you today I have sent in three medications Follow up as schedule for your physical. Sooner if needed

## 2021-09-15 NOTE — Progress Notes (Signed)
Established Patient Office Visit  Subjective   Patient ID: Monica Lindsey, female    DOB: Sep 14, 1945  Age: 76 y.o. MRN: [id number]  Chief Complaint  Patient presents with   Cough    Tested positive for Covid on 08/31/21-still has a lingering cough, has a constant post nasal drip, sinus pressure, fatigue.      Cough: States that she is having coughing, sneezing, wheezing, rhinorrhea, and PND drip.  Tested positive COVID on 08/23/2021. States that her daughter had it then she got it then her grand-daughter. States she stayed at home for 9 days self-isolation/quarantine.  Has been taking benadryl with some relief      Review of Systems  Constitutional:  Negative for chills and fever.       Appetite normal  Fluid intake been good    HENT:  Positive for sinus pain. Negative for ear discharge, ear pain and sore throat.        "+" PND  Respiratory:  Positive for cough (very productive clear) and wheezing. Negative for shortness of breath.   Cardiovascular:  Negative for chest pain.  Neurological:  Positive for headaches.      Objective:     BP 136/78   Pulse 77   Temp (!) 97 F (36.1 C)   Resp 12   Ht '5\' 3"'$  (1.6 m)   Wt 177 lb 2 oz (80.3 kg)   SpO2 96%   BMI 31.38 kg/m    Physical Exam Vitals and nursing note reviewed.  Constitutional:      Appearance: Normal appearance.  HENT:     Right Ear: External ear normal. There is impacted cerumen.     Left Ear: Tympanic membrane, ear canal and external ear normal. There is no impacted cerumen.     Nose:     Right Sinus: Maxillary sinus tenderness present. No frontal sinus tenderness.     Left Sinus: No maxillary sinus tenderness or frontal sinus tenderness.     Mouth/Throat:     Mouth: Mucous membranes are moist.     Pharynx: Oropharynx is clear.  Cardiovascular:     Rate and Rhythm: Normal rate and regular rhythm.     Heart sounds: Normal heart sounds.  Pulmonary:     Effort: Pulmonary effort is normal.      Breath sounds: Normal breath sounds.  Neurological:     Mental Status: She is alert.      No results found for any visits on 09/15/21.    The 10-year ASCVD risk score (Arnett DK, et al., 2019) is: 28.6%    Assessment & Plan:   Problem List Items Addressed This Visit       Respiratory   Upper respiratory tract infection    Given length of time with illness and physical exam we will treat with doxycycline 100 mg twice daily for 7 days.  Defer chest x-ray at this juncture.  Follow-up if no improvement      Relevant Medications   doxycycline (VIBRA-TABS) 100 MG tablet     Nervous and Auditory   Impacted cerumen of right ear - Primary    Verbal consent obtained.  Ear was prepped with cerumen softening eardrops.  Patient's ear was irrigated.  Patient tolerated procedure well      Relevant Orders   Ear Lavage     Other   PND (post-nasal drip)    Try to avoid Benadryl we will send in Zyrtec along with Flonase.  Fluticasone precautions reviewed  in regards of epistaxis.      Relevant Medications   fluticasone (FLONASE) 50 MCG/ACT nasal spray   cetirizine (ZYRTEC) 10 MG tablet   Subacute cough    Has been going on approximately 3 weeks.  Has improved slightly in nature but still present and annoying.  Patient diagnosed with COVID 3+ weeks ago.  Daughter diagnosed with same thing recently went and given antibiotic and seems to be beneficial      Relevant Medications   cetirizine (ZYRTEC) 10 MG tablet    Return if symptoms worsen or fail to improve, for As scheduled.    Romilda Garret, NP

## 2021-09-15 NOTE — Assessment & Plan Note (Signed)
Try to avoid Benadryl we will send in Zyrtec along with Flonase.  Fluticasone precautions reviewed in regards of epistaxis.

## 2021-10-03 ENCOUNTER — Encounter: Payer: Self-pay | Admitting: Nurse Practitioner

## 2021-10-03 ENCOUNTER — Ambulatory Visit (INDEPENDENT_AMBULATORY_CARE_PROVIDER_SITE_OTHER): Payer: Medicare HMO | Admitting: Nurse Practitioner

## 2021-10-03 VITALS — BP 118/84 | HR 94 | Temp 97.1°F | Resp 14 | Ht 63.0 in | Wt 174.0 lb

## 2021-10-03 DIAGNOSIS — Z0000 Encounter for general adult medical examination without abnormal findings: Secondary | ICD-10-CM

## 2021-10-03 DIAGNOSIS — G609 Hereditary and idiopathic neuropathy, unspecified: Secondary | ICD-10-CM

## 2021-10-03 DIAGNOSIS — E538 Deficiency of other specified B group vitamins: Secondary | ICD-10-CM | POA: Diagnosis not present

## 2021-10-03 DIAGNOSIS — R5383 Other fatigue: Secondary | ICD-10-CM | POA: Diagnosis not present

## 2021-10-03 DIAGNOSIS — I10 Essential (primary) hypertension: Secondary | ICD-10-CM | POA: Diagnosis not present

## 2021-10-03 DIAGNOSIS — E785 Hyperlipidemia, unspecified: Secondary | ICD-10-CM

## 2021-10-03 DIAGNOSIS — Z1231 Encounter for screening mammogram for malignant neoplasm of breast: Secondary | ICD-10-CM

## 2021-10-03 DIAGNOSIS — M25511 Pain in right shoulder: Secondary | ICD-10-CM | POA: Diagnosis not present

## 2021-10-03 DIAGNOSIS — G8929 Other chronic pain: Secondary | ICD-10-CM

## 2021-10-03 DIAGNOSIS — K5900 Constipation, unspecified: Secondary | ICD-10-CM

## 2021-10-03 DIAGNOSIS — Z13820 Encounter for screening for osteoporosis: Secondary | ICD-10-CM

## 2021-10-03 LAB — COMPREHENSIVE METABOLIC PANEL
ALT: 13 U/L (ref 0–35)
AST: 18 U/L (ref 0–37)
Albumin: 4.4 g/dL (ref 3.5–5.2)
Alkaline Phosphatase: 44 U/L (ref 39–117)
BUN: 27 mg/dL — ABNORMAL HIGH (ref 6–23)
CO2: 29 mEq/L (ref 19–32)
Calcium: 10.4 mg/dL (ref 8.4–10.5)
Chloride: 99 mEq/L (ref 96–112)
Creatinine, Ser: 1.13 mg/dL (ref 0.40–1.20)
GFR: 46.81 mL/min — ABNORMAL LOW (ref >60.00)
Glucose, Bld: 87 mg/dL (ref 70–99)
Potassium: 3.9 mEq/L (ref 3.5–5.1)
Sodium: 139 mEq/L (ref 135–145)
Total Bilirubin: 0.6 mg/dL (ref 0.2–1.2)
Total Protein: 7.2 g/dL (ref 6.0–8.3)

## 2021-10-03 LAB — LIPID PANEL
Cholesterol: 211 mg/dL — ABNORMAL HIGH (ref 0–200)
HDL: 59.6 mg/dL (ref >39.00)
LDL Cholesterol: 127 mg/dL — ABNORMAL HIGH (ref 0–99)
NonHDL: 151.08
Total CHOL/HDL Ratio: 4
Triglycerides: 121 mg/dL (ref 0.0–149.0)
VLDL: 24.2 mg/dL (ref 0.0–40.0)

## 2021-10-03 LAB — CBC
HCT: 44.4 % (ref 36.0–46.0)
Hemoglobin: 15.2 g/dL — ABNORMAL HIGH (ref 12.0–15.0)
MCHC: 34.2 g/dL (ref 30.0–36.0)
MCV: 88.2 fl (ref 78.0–100.0)
Platelets: 235 10*3/uL (ref 150.0–400.0)
RBC: 5.04 Mil/uL (ref 3.87–5.11)
RDW: 13.9 % (ref 11.5–15.5)
WBC: 7.4 10*3/uL (ref 4.0–10.5)

## 2021-10-03 LAB — VITAMIN B12: Vitamin B-12: 1274 pg/mL — ABNORMAL HIGH (ref 211–911)

## 2021-10-03 LAB — TSH: TSH: 1.02 u[IU]/mL (ref 0.35–5.50)

## 2021-10-03 NOTE — Assessment & Plan Note (Signed)
Discussed age appropriate immunizations and screening exams.

## 2021-10-03 NOTE — Assessment & Plan Note (Signed)
Patient prescribed gabapentin 300 mg nightly.  Patient states she has not taken as she is unsure if she has it is truly neuropathy.  As it is intermittent and comes and goes.

## 2021-10-03 NOTE — Patient Instructions (Signed)
Nice to see you today I will be in touch with the labs once I have them Follow up with me in 6 months, sooner if you need me  Get your tetanus shot at your local pharmacy

## 2021-10-03 NOTE — Assessment & Plan Note (Signed)
Patient is complaining of overarching fatigue took a week to vacuum and clean her house.  We will check basic labs if all normal or negative consider depression.  We discussed with patient in office prior to her leaving

## 2021-10-03 NOTE — Assessment & Plan Note (Signed)
Continue over-the-counter regimens.  Patient states when she remembers to eat prunes her bowels are much easier to move.

## 2021-10-03 NOTE — Assessment & Plan Note (Signed)
Continue taking pravastatin 40 mg prescribed.  Pending lab results today

## 2021-10-03 NOTE — Progress Notes (Signed)
Established Patient Office Visit  Subjective   Patient ID: Monica Lindsey, female    DOB: [birth year]  Age: 76 y.o. MRN: [id number]  Chief Complaint  Patient presents with   Annual Exam    AWV was done on 08/01/21    HPI   HTN: Currently mantained on  amlodipine and hctz. States that she does not check her blood pressure at home. Tolerating the medications well  HLD: Patient currently maintained on pravastatin 40 mg.  Vitamin D: Patient currently maintained on vitamin D 1000 units over-the-counter.  Last vitamin D level within normal limits.  Neuropathy states that she does not take gabapentin. States that she is unsure if it is neuropathy   Fatigue: State that it has taken her a whole week to clean and vacuum the house. States that she feels tired. Has to   for complete physical and follow up of chronic conditions.  Immunizations: -Tetanus: needs to up at pharmacy -Influenza: -Covid-19: up to date -Shingles: up to date -Pneumonia:  up to date  -HPV: Aged out  Diet: [place]. 2-3 meals a day. States they are smaller. States mostly water Exercise: No regular exercise. Feels too tired.  Eye exam: needs updating  Dental exam: Completes semi-annually   Pap Smear:  aged out Mammogram: Completed in 2021.  Order placed for [facility]  Colonoscopy:  aged out  Lung Cancer Screening: N/A  Dexa: Completed in 2020 ordered today     08/01/2021   11:33 AM 09/18/2020    2:15 PM 03/26/2020   10:48 AM  PHQ9 SCORE ONLY  PHQ-9 Total Score 0 0 0     Advance directive: HPOA  and living will daughter is POA Event organiser    Review of Systems  Constitutional:  Positive for malaise/fatigue. Negative for chills and fever.  Respiratory:  Negative for shortness of breath.   Cardiovascular:  Negative for chest pain and leg swelling.  Gastrointestinal:  Positive for constipation. Negative for abdominal pain, nausea and vomiting.  Genitourinary:  Negative for  dysuria and hematuria.  Neurological:  Positive for tingling.      Objective:     BP 118/84   Pulse 94   Temp (!) 97.1 F (36.2 C)   Resp 14   Ht '5\' 3"'$  (1.6 m)   Wt 174 lb (78.9 kg)   SpO2 99%   BMI 30.82 kg/m    Physical Exam Vitals and nursing note reviewed.  Constitutional:      Appearance: Normal appearance.  HENT:     Right Ear: Tympanic membrane, ear canal and external ear normal.     Left Ear: Tympanic membrane, ear canal and external ear normal.     Mouth/Throat:     Mouth: Mucous membranes are moist.     Pharynx: Oropharynx is clear.  Eyes:     Extraocular Movements: Extraocular movements intact.     Pupils: Pupils are equal, round, and reactive to light.  Cardiovascular:     Rate and Rhythm: Normal rate and regular rhythm.  Pulmonary:     Effort: Pulmonary effort is normal.     Breath sounds: Normal breath sounds.  Abdominal:     General: Bowel sounds are normal.  Musculoskeletal:     Right lower leg: No edema.     Left lower leg: No edema.  Lymphadenopathy:     Cervical: No cervical adenopathy.  Skin:    General: Skin is warm.  Neurological:     General:  No focal deficit present.     Mental Status: She is alert.     Deep Tendon Reflexes:     Reflex Scores:      Bicep reflexes are 2+ on the right side and 2+ on the left side.      Patellar reflexes are 2+ on the right side and 2+ on the left side.    Comments: Bilateral upper and lower extremity strength 5/5      No results found for any visits on 10/03/21.    The 10-year ASCVD risk score (Arnett DK, et al., 2019) is: 22.3%    Assessment & Plan:   Problem List Items Addressed This Visit       Cardiovascular and Mediastinum   Essential hypertension - Primary    Patient currently maintained on amlodipine and hydrochlorothiazide.  Blood pressure within normal limits today.  Patient tolerating medication without complaint.  Continue take medication as prescribed        Nervous and  Auditory   Peripheral neuropathy    Patient prescribed gabapentin 300 mg nightly.  Patient states she has not taken as she is unsure if she has it is truly neuropathy.  As it is intermittent and comes and goes.        Other   HLD (hyperlipidemia)    Continue taking pravastatin 40 mg prescribed.  Pending lab results today      Relevant Orders   Lipid panel   B12 deficiency    Pending labs today      Relevant Orders   Vitamin B12   Constipation    Continue over-the-counter regimens.  Patient states when she remembers to eat prunes her bowels are much easier to move.      Preventative health care    Discussed age appropriate immunizations and screening exams.      Relevant Orders   CBC   Comprehensive metabolic panel   Lipid panel   TSH   Chronic right shoulder pain   Other fatigue    Patient is complaining of overarching fatigue took a week to vacuum and clean her house.  We will check basic labs if all normal or negative consider depression.  We discussed with patient in office prior to her leaving      Relevant Orders   TSH   Other Visit Diagnoses     Screening mammogram for breast cancer       Relevant Orders   MM 3D SCREEN BREAST BILATERAL   Screening for osteoporosis       Relevant Orders   DG Bone Density       Return in about 6 months (around 04/03/2022) for recheck chronic conditions .    Romilda Garret, NP

## 2021-10-03 NOTE — Assessment & Plan Note (Signed)
Pending labs today. 

## 2021-10-03 NOTE — Assessment & Plan Note (Signed)
Patient currently maintained on amlodipine and hydrochlorothiazide.  Blood pressure within normal limits today.  Patient tolerating medication without complaint.  Continue take medication as prescribed

## 2021-10-24 ENCOUNTER — Ambulatory Visit: Payer: Medicare HMO | Admitting: Family

## 2021-10-24 DIAGNOSIS — M19011 Primary osteoarthritis, right shoulder: Secondary | ICD-10-CM | POA: Diagnosis not present

## 2021-10-27 ENCOUNTER — Ambulatory Visit: Payer: Medicare HMO | Admitting: Nurse Practitioner

## 2021-11-26 ENCOUNTER — Encounter: Payer: Self-pay | Admitting: Nurse Practitioner

## 2021-11-26 ENCOUNTER — Ambulatory Visit (INDEPENDENT_AMBULATORY_CARE_PROVIDER_SITE_OTHER): Payer: Medicare HMO | Admitting: Nurse Practitioner

## 2021-11-26 VITALS — BP 122/80 | HR 94 | Temp 97.0°F | Ht 63.0 in | Wt 183.0 lb

## 2021-11-26 DIAGNOSIS — Z23 Encounter for immunization: Secondary | ICD-10-CM | POA: Diagnosis not present

## 2021-11-26 DIAGNOSIS — H6993 Unspecified Eustachian tube disorder, bilateral: Secondary | ICD-10-CM | POA: Insufficient documentation

## 2021-11-26 DIAGNOSIS — I4819 Other persistent atrial fibrillation: Secondary | ICD-10-CM | POA: Insufficient documentation

## 2021-11-26 DIAGNOSIS — R600 Localized edema: Secondary | ICD-10-CM | POA: Diagnosis not present

## 2021-11-26 DIAGNOSIS — I4891 Unspecified atrial fibrillation: Secondary | ICD-10-CM | POA: Diagnosis not present

## 2021-11-26 DIAGNOSIS — I499 Cardiac arrhythmia, unspecified: Secondary | ICD-10-CM | POA: Diagnosis not present

## 2021-11-26 MED ORDER — APIXABAN 5 MG PO TABS: 5.0000 mg | ORAL_TABLET | Freq: Two times a day (BID) | ORAL | 2 refills | Status: DC

## 2021-11-26 MED ORDER — METOPROLOL TARTRATE 25 MG PO TABS: 12.5000 mg | ORAL_TABLET | Freq: Two times a day (BID) | ORAL | 1 refills | Status: DC

## 2021-11-26 MED ORDER — FLUTICASONE PROPIONATE 50 MCG/ACT NA SUSP: 2.0000 | Freq: Every day | NASAL | 0 refills | Status: DC

## 2021-11-26 NOTE — Assessment & Plan Note (Signed)
Could be multifactorial sound to dependent in nature.  Given patient's new onset heart failure we will check basic labs inclusive of BNP.  Pending result.  Continue elevating the feet at home patient is already on HCTZ 25 mg daily patient states she has not missed any doses.

## 2021-11-26 NOTE — Assessment & Plan Note (Signed)
Irregular sounding heartbeat on exam.  EKG confirmed April atrial fibrillation.  Patient has a CHA2DS2-VASc score of 4.  Patient's creatinine less than 1.5 she is greater than 60 kg and GFR is stable we will start her on Eliquis 5 mg twice daily.  Did go over precautions.  Patient was taking a baby aspirin daily did inform patient to stop the aspirin and not to take anymore until told different by provider.  Ambulatory referral to cardiology in [facility] per patient request.  Will have close follow-up in 1 week to make sure patient's heart rate is doing okay and tolerating medicine well

## 2021-11-26 NOTE — Assessment & Plan Note (Signed)
EKG in office.

## 2021-11-26 NOTE — Assessment & Plan Note (Signed)
Clear fluid behind bilateral TMs encourage patient to start taking antihistamine Zyrtec we will send in Flonase follow-up if no improvement

## 2021-11-26 NOTE — Patient Instructions (Signed)
Nice to see you today I want to see you in a week and see how your heart is doing Check your blood pressure and pulse at home. If you pulse is below 60 hold the new medication for a dose (metoprolol)   I have referred you to a cardiologist.

## 2021-11-26 NOTE — Progress Notes (Signed)
Established Patient Office Visit  Subjective   Patient ID: ADRINNE Lindsey, female    DOB: 1945/05/30  Age: 76 y.o. MRN: [id number]  Chief Complaint  Patient presents with   Bilateral Ankle Swelling    Started over the weekend. Sunday tried to keep them elevated and ice. A little better today.   Ear Fullness    HPI  Ankle swelling: States that she notied the ankle swelling 1.5 weeks ago. States that she did not notice it this past Saturday. States that she did put ice on it States that it helped some. States that she will notice that the swelling will go down with elevation. States that she did elevate it Sunday and that did help.  Ear fullness: States that it feels like she is on a [place]. [place] and it started before then. States that it feel full and does not hurt. States that she did try severe cold and flu that did not help    Review of Systems  Constitutional:  Negative for chills and fever.  Respiratory:  Negative for shortness of breath.   Cardiovascular:  Positive for leg swelling. Negative for chest pain.      Objective:     BP 122/80 (BP Location: Left Arm, Patient Position: Sitting, Cuff Size: Large)   Pulse 94   Temp (!) 97 F (36.1 C)   Ht '5\' 3"'$  (1.6 m)   Wt 183 lb (83 kg)   SpO2 97%   BMI 32.42 kg/m  BP Readings from Last 3 Encounters:  11/26/21 122/80  10/03/21 118/84  09/15/21 136/78   Wt Readings from Last 3 Encounters:  11/26/21 183 lb (83 kg)  10/03/21 174 lb (78.9 kg)  09/15/21 177 lb 2 oz (80.3 kg)      Physical Exam Vitals and nursing note reviewed.  Constitutional:      Appearance: Normal appearance.  HENT:     Right Ear: Ear canal and external ear normal.     Left Ear: Ear canal and external ear normal.     Ears:     Comments: Bilateral fluid behind both TM    Mouth/Throat:     Mouth: Mucous membranes are moist.     Pharynx: Oropharynx is clear.  Cardiovascular:      Rate and Rhythm: Normal rate. Rhythm irregular.     Heart sounds: Normal heart sounds.  Pulmonary:     Effort: Pulmonary effort is normal.     Breath sounds: Normal breath sounds.  Musculoskeletal:     Right lower leg: Edema present.     Left lower leg: Edema present.  Lymphadenopathy:     Cervical: No cervical adenopathy.  Neurological:     Mental Status: She is alert.      No results found for any visits on 11/26/21.    The 10-year ASCVD risk score (Arnett DK, et al., 2019) is: 26.3%    Assessment & Plan:   Problem List Items Addressed This Visit       Cardiovascular and Mediastinum   New onset atrial fibrillation (HCC)    Irregular sounding heartbeat on exam.  EKG confirmed April atrial fibrillation.  Patient has a CHA2DS2-VASc score of 4.  Patient's creatinine less than 1.5 she is greater than 60 kg and GFR is stable we will start her on Eliquis 5 mg twice daily.  Did go over precautions.  Patient was taking a baby aspirin  daily did inform patient to stop the aspirin and not to take anymore until told different by provider.  Ambulatory referral to cardiology in [place] per patient request.  Will have close follow-up in 1 week to make sure patient's heart rate is doing okay and tolerating medicine well      Relevant Medications   apixaban (ELIQUIS) 5 MG TABS tablet   metoprolol tartrate (LOPRESSOR) 25 MG tablet   Other Relevant Orders   CBC   Comprehensive metabolic panel   TSH   Brain natriuretic peptide   Ambulatory referral to Cardiology     Nervous and Auditory   Eustachian tube dysfunction, bilateral    Clear fluid behind bilateral TMs encourage patient to start taking antihistamine Zyrtec we will send in Flonase follow-up if no improvement      Relevant Medications   fluticasone (FLONASE) 50 MCG/ACT nasal spray     Other   Lower extremity edema    Could be multifactorial sound to dependent in nature.  Given patient's new onset heart failure we will  check basic labs inclusive of BNP.  Pending result.  Continue elevating the feet at home patient is already on HCTZ 25 mg daily patient states she has not missed any doses.      Relevant Orders   Brain natriuretic peptide   Irregular heartbeat - Primary    EKG in office.      Relevant Orders   EKG 12-Lead (Completed)   Other Visit Diagnoses     Need for influenza vaccination       Relevant Orders   Flu vaccine HIGH DOSE PF (Fluzone High dose)       Return in about 1 year (around 11/27/2022) for a fib recheck .    Romilda Garret, NP

## 2021-11-27 LAB — COMPREHENSIVE METABOLIC PANEL
ALT: 33 U/L (ref 0–35)
AST: 23 U/L (ref 0–37)
Albumin: 4.1 g/dL (ref 3.5–5.2)
Alkaline Phosphatase: 39 U/L (ref 39–117)
BUN: 17 mg/dL (ref 6–23)
CO2: 29 mEq/L (ref 19–32)
Calcium: 9.6 mg/dL (ref 8.4–10.5)
Chloride: 102 mEq/L (ref 96–112)
Creatinine, Ser: 0.99 mg/dL (ref 0.40–1.20)
GFR: 54.81 mL/min — ABNORMAL LOW (ref >60.00)
Glucose, Bld: 87 mg/dL (ref 70–99)
Potassium: 3.9 mEq/L (ref 3.5–5.1)
Sodium: 139 mEq/L (ref 135–145)
Total Bilirubin: 0.5 mg/dL (ref 0.2–1.2)
Total Protein: 6.1 g/dL (ref 6.0–8.3)

## 2021-11-27 LAB — CBC
HCT: 42.5 % (ref 36.0–46.0)
Hemoglobin: 13.9 g/dL (ref 12.0–15.0)
MCHC: 32.7 g/dL (ref 30.0–36.0)
MCV: 91 fl (ref 78.0–100.0)
Platelets: 206 10*3/uL (ref 150.0–400.0)
RBC: 4.67 Mil/uL (ref 3.87–5.11)
RDW: 13.9 % (ref 11.5–15.5)
WBC: 6.1 10*3/uL (ref 4.0–10.5)

## 2021-11-27 LAB — TSH: TSH: 0.82 u[IU]/mL (ref 0.35–5.50)

## 2021-11-27 LAB — BRAIN NATRIURETIC PEPTIDE: Pro B Natriuretic peptide (BNP): 189 pg/mL — ABNORMAL HIGH (ref 0.0–100.0)

## 2021-12-04 ENCOUNTER — Ambulatory Visit (INDEPENDENT_AMBULATORY_CARE_PROVIDER_SITE_OTHER): Payer: Medicare HMO | Admitting: Nurse Practitioner

## 2021-12-04 DIAGNOSIS — I4891 Unspecified atrial fibrillation: Secondary | ICD-10-CM | POA: Diagnosis not present

## 2021-12-04 MED ORDER — METOPROLOL TARTRATE 25 MG PO TABS: 25.0000 mg | ORAL_TABLET | Freq: Two times a day (BID) | ORAL | 1 refills | Status: DC

## 2021-12-04 NOTE — Patient Instructions (Addendum)
Nice to see you today I am increasing the metoprolol from 0.5 tablets to 1 tablet twice a day. Continue taking the Eliquis as directed.   The cardiologist number and information is:  Address: [id number] [facility] [address] [place], [place], [place] [address] Hours:  Open ? Closes 5?PM Phone: [phone]  If you can get a blood pressure cuff check your blood pressure daily. If you have lightheadedness/dizziness let me know

## 2021-12-04 NOTE — Assessment & Plan Note (Signed)
Patient tolerating Eliquis and metoprolol well.  Patient's rate still not controlled on auscultation today.  We will titrate metoprolol from 12.5 mg twice daily to 25 mg twice daily.  Patient encouraged to get a blood pressure cuff if she gets lightheaded or dizzy she will reach out to the office her pulse falls below 60 she will call out to the office.  Did give patient information regards to contacting cardiology office.  Follow-up in 1 week for recheck

## 2021-12-04 NOTE — Progress Notes (Signed)
Established Patient Office Visit  Subjective   Patient ID: RANIKA MCNIEL, female    DOB: [birth year]  Age: 76 y.o. MRN: [id number]  Chief Complaint  Patient presents with   Atrial Fibrillation    Follow up. Feet swelling is better. Tolerating medications well at this time. Advised patient that cardiologist office has reached out to schedule and advised to call them back.      A-Fib: Patient was seen by me on 11/26/2021.  Upon physical examination noticed that she had an irregular heartbeat.  EKG was obtained and patient was in atrial fibrillation.  Start patient on Eliquis 5 mg twice daily and metoprolol extended release 25 mg daily for rate control.  Patient is here for follow-up.  She is accompanied by her daughter today in office.  Patient was started on Eliquis 5 mg twice daily tolerating medication well.  Patient was started on 12.5 mg of metoprolol twice daily also tolerating medication well.  Patient states she has had a couple incidents where she felt palpitations but denies shortness of breath or chest pain.  She is experiencing some fatigue.  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  She was referred to cardiology but states she has not talked to them directly.    Review of Systems  Constitutional:  Negative for chills and fever.  Respiratory:  Negative for shortness of breath.   Cardiovascular:  Positive for palpitations and leg swelling. Negative for chest pain.  Neurological:  Negative for dizziness.      Objective:     BP 130/72   Pulse (!) 102   Temp 97.6 F (36.4 C) (Temporal)   Resp 10   Ht '5\' 3"'$  (1.6 m)   Wt 183 lb 8 oz (83.2 kg)   SpO2 98%   BMI 32.51 kg/m  BP Readings from Last 3 Encounters:  12/04/21 130/72  11/26/21 122/80  10/03/21 118/84   Wt Readings from Last 3 Encounters:  12/04/21 183 lb 8 oz (83.2 kg)  11/26/21 183 lb (83 kg)  10/03/21 174 lb (78.9 kg)      Physical Exam Vitals and nursing note reviewed.  Cardiovascular:     Rate  and Rhythm: Tachycardia present. Rhythm irregular.     Heart sounds: Normal heart sounds.  Pulmonary:     Effort: Pulmonary effort is normal.     Breath sounds: Normal breath sounds.  Musculoskeletal:     Right lower leg: Edema (trace) present.     Left lower leg: Edema (trace) present.  Neurological:     Mental Status: She is alert.      No results found for any visits on 12/04/21.    The 10-year ASCVD risk score (Arnett DK, et al., 2019) is: 29.3%    Assessment & Plan:   Problem List Items Addressed This Visit       Cardiovascular and Mediastinum   New onset atrial fibrillation [facility])    Patient tolerating Eliquis and metoprolol well.  Patient's rate still not controlled on auscultation today.  We will titrate metoprolol from 12.5 mg twice daily to 25 mg twice daily.  Patient encouraged to get a blood pressure cuff if she gets lightheaded or dizzy she will reach out to the office her pulse falls below 60 she will call out to the office.  Did give patient information regards to contacting cardiology office.  Follow-up in 1 week for recheck      Relevant Medications   metoprolol tartrate (LOPRESSOR) 25 MG  tablet    Return in about 1 week (around 12/11/2021) for A fib recheck .    Romilda Garret, NP

## 2021-12-09 ENCOUNTER — Other Ambulatory Visit: Payer: Medicare HMO

## 2021-12-11 ENCOUNTER — Ambulatory Visit (INDEPENDENT_AMBULATORY_CARE_PROVIDER_SITE_OTHER): Payer: Medicare HMO | Admitting: Nurse Practitioner

## 2021-12-11 VITALS — BP 130/88 | HR 75 | Temp 96.2°F | Resp 14 | Ht 63.0 in | Wt 183.1 lb

## 2021-12-11 DIAGNOSIS — I10 Essential (primary) hypertension: Secondary | ICD-10-CM

## 2021-12-11 DIAGNOSIS — I4891 Unspecified atrial fibrillation: Secondary | ICD-10-CM

## 2021-12-11 NOTE — Patient Instructions (Signed)
Nice to see you today We will keep your medications the same, no changes I want to see you in 3 months, sooner if you need me Keep the appointment with the cardiologist

## 2021-12-11 NOTE — Progress Notes (Signed)
   Established Patient Office Visit  Subjective   Patient ID: Monica Lindsey, female    DOB: February 15, 1945  Age: 76 y.o. MRN: [id number]  Chief Complaint  Patient presents with   atrial fibrilliation    Follow up, feeling better.    HPI  A fib: Patient was seen by me on 11/26/2021 and noted to have irregular heartbeat after EKG diagnosed with atrial fibrillation.  Patient was placed on Eliquis and low-dose metoprolol.  Patient followed up with me on 12/04/2021 for recheck doing well with medications but heart rate was still above normal and we titrated patient's metoprolol to 25 mg twice daily.  Patient is here today for recheck.  Patient states she is taking medication as prescribed and tolerating them well.  Patient actually mentioned that she has more energy now than she did previously.  Patient denies any chest pain, shortness of breath, lightheadedness, dizziness.  They do have an appoint with cardiology on 01/27/2022 and have called to be on the cancellation list.    Review of Systems  Constitutional:  Negative for chills, fever and malaise/fatigue.  Respiratory:  Negative for shortness of breath.   Cardiovascular:  Negative for chest pain.  Neurological:  Negative for dizziness and weakness.      Objective:     BP 130/88   Pulse 75   Temp (!) 96.2 F (35.7 C) (Temporal)   Resp 14   Ht '5\' 3"'$  (1.6 m)   Wt 183 lb 2 oz (83.1 kg)   SpO2 98%   BMI 32.44 kg/m    Physical Exam Vitals and nursing note reviewed.  Constitutional:      Appearance: Normal appearance.  Cardiovascular:     Rate and Rhythm: Normal rate. Rhythm irregular.     Heart sounds: Normal heart sounds.  Pulmonary:     Breath sounds: Normal breath sounds.  Neurological:     Mental Status: She is alert.      No results found for any visits on 12/11/21.    The 10-year ASCVD risk score (Arnett DK, et al., 2019) is: 29.3%    Assessment & Plan:   Problem List Items Addressed This Visit        Cardiovascular and Mediastinum   Essential hypertension - Primary    Patient's blood pressure within normal limits today tolerating the increase in metoprolol well.  Continue metoprolol 25 mg twice daily      New onset atrial fibrillation [facility])    Patient feeling good per her report.  She is tolerating Eliquis and metoprolol.  Rate controlled in office today patient has an appointment scheduled with cardiology for 01/27/2022 but is on a cancellation list to be of it sooner.  Continue Eliquis 5 mg twice daily and metoprolol 25 mg twice daily       Return in about 3 months (around 03/13/2022) for Recheck .    Romilda Garret, NP

## 2021-12-11 NOTE — Assessment & Plan Note (Signed)
Patient's blood pressure within normal limits today tolerating the increase in metoprolol well.  Continue metoprolol 25 mg twice daily

## 2021-12-11 NOTE — Assessment & Plan Note (Signed)
Patient feeling good per her report.  She is tolerating Eliquis and metoprolol.  Rate controlled in office today patient has an appointment scheduled with cardiology for 01/27/2022 but is on a cancellation list to be of it sooner.  Continue Eliquis 5 mg twice daily and metoprolol 25 mg twice daily

## 2021-12-31 ENCOUNTER — Telehealth: Payer: Self-pay | Admitting: Nurse Practitioner

## 2021-12-31 DIAGNOSIS — M13811 Other specified arthritis, right shoulder: Secondary | ICD-10-CM | POA: Diagnosis not present

## 2021-12-31 DIAGNOSIS — M25511 Pain in right shoulder: Secondary | ICD-10-CM | POA: Diagnosis not present

## 2021-12-31 NOTE — Telephone Encounter (Signed)
Left message to call back  Does patient mean [facility]? Need to confirm, no [facility] in the system

## 2021-12-31 NOTE — Telephone Encounter (Signed)
See other phone note, its [place]

## 2021-12-31 NOTE — Telephone Encounter (Signed)
Patient would like all of her medications,the three that already gets sent ,as well as doxycycline '100mg'$  and  rx metoprolol tartrate (LOPRESSOR) 25 MG tablet  sent to    [place], [place] Phone: [phone]  Fax: [phone]     going forward.

## 2021-12-31 NOTE — Telephone Encounter (Signed)
Pt called to permanently change her pharmacy to mail order, "[facility]". Call back # [phone]

## 2021-12-31 NOTE — Telephone Encounter (Signed)
Called patient but voicemail is full  Amlodipine, Hydrochlorothiazide and Pravastatin already go to [facility] for refills, per chart she has another refill on file with them to last until February 2024.

## 2022-01-01 NOTE — Telephone Encounter (Signed)
Called again no voicemail

## 2022-01-05 ENCOUNTER — Other Ambulatory Visit: Payer: Medicare HMO

## 2022-01-05 NOTE — Telephone Encounter (Signed)
Left another message on voicemail for patient to call the office back. 

## 2022-01-05 NOTE — Telephone Encounter (Signed)
Patient notified by telephone that she should have a refill and to contact her pharmacy. Pharmacy telephone number given to patient.

## 2022-01-12 ENCOUNTER — Other Ambulatory Visit: Payer: Self-pay

## 2022-01-12 DIAGNOSIS — I4891 Unspecified atrial fibrillation: Secondary | ICD-10-CM

## 2022-01-12 MED ORDER — APIXABAN 5 MG PO TABS: 5.0000 mg | ORAL_TABLET | Freq: Two times a day (BID) | ORAL | 2 refills | Status: DC

## 2022-01-12 MED ORDER — METOPROLOL TARTRATE 25 MG PO TABS: 25.0000 mg | ORAL_TABLET | Freq: Two times a day (BID) | ORAL | 1 refills | Status: DC

## 2022-01-12 NOTE — Telephone Encounter (Signed)
Last visit 12/12/2022 Next 03/13/2022

## 2022-01-13 ENCOUNTER — Other Ambulatory Visit: Payer: Medicare HMO

## 2022-01-14 ENCOUNTER — Ambulatory Visit (INDEPENDENT_AMBULATORY_CARE_PROVIDER_SITE_OTHER): Payer: Medicare HMO | Admitting: Nurse Practitioner

## 2022-01-14 VITALS — BP 110/70 | HR 97 | Temp 97.2°F | Ht 63.0 in | Wt 186.0 lb

## 2022-01-14 DIAGNOSIS — I4891 Unspecified atrial fibrillation: Secondary | ICD-10-CM | POA: Diagnosis not present

## 2022-01-14 DIAGNOSIS — R600 Localized edema: Secondary | ICD-10-CM

## 2022-01-14 DIAGNOSIS — I10 Essential (primary) hypertension: Secondary | ICD-10-CM | POA: Diagnosis not present

## 2022-01-14 MED ORDER — METOPROLOL TARTRATE 25 MG PO TABS: 25.0000 mg | ORAL_TABLET | Freq: Two times a day (BID) | ORAL | 0 refills | Status: DC

## 2022-01-14 MED ORDER — HYDROCHLOROTHIAZIDE 25 MG PO TABS: ORAL_TABLET | ORAL | 0 refills | Status: DC

## 2022-01-14 NOTE — Progress Notes (Signed)
   Established Patient Office Visit  Subjective   Patient ID: LAKETTA SODERBERG, female    DOB: September 19, 1945  Age: 76 y.o. MRN: [id number]  Chief Complaint  Patient presents with   Foot Swelling    Started 2 days ago. Has been keeping elevated but not helping much. Has been feeling unsteady and just not like herself.     HPI   Lower edema extremity: States that it started 1 day ago yesterday evening. Worked through the day and noticed that evening. States that she noticed that evening.  States that she has ran out of HCTZ and metoeprolol for the past few days. States that she did noticed that the swelling was down some this morning. She did use an ice pack yesterday afternoon.  Patient states she did take extra amlodipine in the absence of her metoprolol and HCTZ.     Review of Systems  Constitutional:  Negative for chills and fever.  Respiratory:  Positive for shortness of breath.   Cardiovascular:  Positive for leg swelling. Negative for chest pain.      Objective:     BP 110/70   Pulse 97   Temp (!) 97.2 F (36.2 C) (Temporal)   Ht '5\' 3"'$  (1.6 m)   Wt 186 lb (84.4 kg)   SpO2 97%   BMI 32.95 kg/m    Physical Exam Vitals and nursing note reviewed.  Constitutional:      Appearance: Normal appearance.  Cardiovascular:     Rate and Rhythm: Normal rate. Rhythm irregular.     Heart sounds: Normal heart sounds.  Pulmonary:     Effort: Pulmonary effort is normal.     Breath sounds: Normal breath sounds.  Musculoskeletal:     Right lower leg: 2+ Edema present.     Left lower leg: 1+ Edema present.  Neurological:     Mental Status: She is alert.      No results found for any visits on 01/14/22.    The 10-year ASCVD risk score (Arnett DK, et al., 2019) is: 21.9%    Assessment & Plan:   Problem List Items Addressed This Visit       Cardiovascular and Mediastinum   Essential hypertension    Blood pressure well-controlled today.  Refills of medications  provided      Relevant Medications   metoprolol tartrate (LOPRESSOR) 25 MG tablet   hydrochlorothiazide (HYDRODIURIL) 25 MG tablet   New onset atrial fibrillation [facility])    Patient still in A-fib in office today.  Has been out of her metoprolol we will send an acute prescription to local pharmacy while [place] works on prescription well.      Relevant Medications   metoprolol tartrate (LOPRESSOR) 25 MG tablet   hydrochlorothiazide (HYDRODIURIL) 25 MG tablet     Other   Bilateral lower extremity edema - Primary    Multifactorial in nature.  Patient seems to be having dependent edema that gets better with elevation.  Also has run out of her HCTZ fluid pill.  Did instruct patient to keep feet elevated over the next week also will take 3 to 5 days was getting back on fluid pill to fully resolve or go back to her baseline.  This does not she will reach out to the office so we can investigate further.       Return if symptoms worsen or fail to improve.    Romilda Garret, NP

## 2022-01-14 NOTE — Assessment & Plan Note (Signed)
Patient still in A-fib in office today.  Has been out of her metoprolol we will send an acute prescription to local pharmacy while [place] works on prescription well.

## 2022-01-14 NOTE — Patient Instructions (Signed)
Nice to see you today I have sent a prescription to the [place] of the hydrochlorothiazide and metoprolol This will take 3-5 days to go down after starting on the medication You need to keep your feet elevated through out the day

## 2022-01-14 NOTE — Assessment & Plan Note (Signed)
Blood pressure well-controlled today.  Refills of medications provided

## 2022-01-14 NOTE — Assessment & Plan Note (Signed)
Multifactorial in nature.  Patient seems to be having dependent edema that gets better with elevation.  Also has run out of her HCTZ fluid pill.  Did instruct patient to keep feet elevated over the next week also will take 3 to 5 days was getting back on fluid pill to fully resolve or go back to her baseline.  This does not she will reach out to the office so we can investigate further.

## 2022-01-22 ENCOUNTER — Telehealth: Payer: Self-pay | Admitting: Nurse Practitioner

## 2022-01-22 NOTE — Telephone Encounter (Signed)
Patient called to get a call about seeing a cardiologist and it has been scheduled a month away and wanted to know if that was too long. Call back number [phone].

## 2022-01-22 NOTE — Telephone Encounter (Signed)
I see that the Dr Rockey Situ appt got cancelled. As long as she is doing well with the medications it is safe to wait

## 2022-01-22 NOTE — Telephone Encounter (Signed)
Left message to return call to our office.  

## 2022-01-23 NOTE — Telephone Encounter (Signed)
Left message to return call to our office.  

## 2022-01-27 ENCOUNTER — Ambulatory Visit: Payer: Medicare HMO | Admitting: Cardiovascular Disease

## 2022-01-27 NOTE — Telephone Encounter (Signed)
Left message to return call to our office.  

## 2022-01-28 NOTE — Telephone Encounter (Signed)
Patient called in returning a call she received.  

## 2022-01-28 NOTE — Telephone Encounter (Signed)
Left message to return call to our office.  

## 2022-02-03 ENCOUNTER — Encounter: Payer: Self-pay | Admitting: Nurse Practitioner

## 2022-02-03 ENCOUNTER — Ambulatory Visit (INDEPENDENT_AMBULATORY_CARE_PROVIDER_SITE_OTHER): Payer: Medicare PPO | Admitting: Nurse Practitioner

## 2022-02-03 VITALS — BP 132/82 | HR 91 | Ht 63.0 in | Wt 176.6 lb

## 2022-02-03 DIAGNOSIS — R051 Acute cough: Secondary | ICD-10-CM

## 2022-02-03 DIAGNOSIS — J069 Acute upper respiratory infection, unspecified: Secondary | ICD-10-CM

## 2022-02-03 LAB — POC INFLUENZA A&B (BINAX/QUICKVUE)
Influenza A, POC: NEGATIVE
Influenza B, POC: NEGATIVE

## 2022-02-03 LAB — POC COVID19 BINAXNOW: SARS Coronavirus 2 Ag: NEGATIVE

## 2022-02-03 MED ORDER — GUAIFENESIN ER 600 MG PO TB12: 600.0000 mg | ORAL_TABLET | Freq: Two times a day (BID) | ORAL | 0 refills | Status: DC

## 2022-02-03 MED ORDER — BENZONATATE 100 MG PO CAPS: 100.0000 mg | ORAL_CAPSULE | Freq: Three times a day (TID) | ORAL | 0 refills | Status: DC | PRN

## 2022-02-03 NOTE — Assessment & Plan Note (Addendum)
Flu and COVID test negative in office.  At end of office visit patient mention about being around infant great-grandchild to 4 months and talked about RSV testing pending swab.  Patient to rest drink plenty of fluids symptomatic medication treatment.  Patient was instructed if she does not continue to get better through the week to reach out Friday and we will send in antibiotics likely use doxycycline

## 2022-02-03 NOTE — Progress Notes (Signed)
Acute Office Visit  Subjective:     Patient ID: Monica Lindsey, female    DOB: 1945-11-10, 77 y.o.   MRN: [id number]  Chief Complaint  Patient presents with   Cough    X5days, often productive, denies fever/body aches   Nasal Congestion    With eye pressure     Patient is in today for sick symptoms  Symptoms started 5 days ago No sick contacts. States that her grandaugther and fiance had similar sympotms. She had a fever the patinet does not have a fever Flu vaccine: 11/26/2021 Covid vaccine: [employer] x2 and boosters  States that she has been taking otc medications. Cold relief that has not helped. Patient does mention she is on her symptoms have improved definitely since yesterday.   Review of Systems  Constitutional:  Negative for chills, fever and malaise/fatigue.       Appetite decreased Fluid intake is good   HENT:  Positive for ear pain (full). Negative for ear discharge, sinus pain and sore throat.   Respiratory:  Positive for cough and sputum production. Negative for shortness of breath.   Musculoskeletal:  Negative for joint pain and neck pain.  Neurological:  Positive for headaches.        Objective:    BP 132/82   Pulse 91   Ht '5\' 3"'$  (1.6 m)   Wt 176 lb 9.6 oz (80.1 kg)   SpO2 96%   BMI 31.28 kg/m  BP Readings from Last 3 Encounters:  02/03/22 132/82  01/14/22 110/70  12/11/21 130/88   Wt Readings from Last 3 Encounters:  02/03/22 176 lb 9.6 oz (80.1 kg)  01/14/22 186 lb (84.4 kg)  12/11/21 183 lb 2 oz (83.1 kg)      Physical Exam Vitals and nursing note reviewed.  Constitutional:      Appearance: Normal appearance.  HENT:     Right Ear: Ear canal and external ear normal.     Left Ear: Tympanic membrane, ear canal and external ear normal.     Ears:     Comments: Fluid behind left TM    Nose:     Right Sinus: No maxillary sinus tenderness or frontal sinus tenderness.     Left Sinus: No maxillary sinus tenderness or frontal sinus  tenderness.     Mouth/Throat:     Mouth: Mucous membranes are moist.     Pharynx: Oropharynx is clear.  Cardiovascular:     Rate and Rhythm: Normal rate and regular rhythm.     Heart sounds: Normal heart sounds.  Pulmonary:     Effort: Pulmonary effort is normal.     Breath sounds: Rhonchi (right sided) present.  Lymphadenopathy:     Cervical: No cervical adenopathy.  Neurological:     Mental Status: She is alert.     Results for orders placed or performed in visit on 02/03/22  POC Influenza A&B(BINAX/QUICKVUE)  Result Value Ref Range   Influenza A, POC Negative Negative   Influenza B, POC Negative Negative  POC COVID-19  Result Value Ref Range   SARS Coronavirus 2 Ag Negative Negative        Assessment & Plan:   Problem List Items Addressed This Visit       Respiratory   Upper respiratory tract infection    Flu and COVID test negative in office.  At end of office visit patient mention about being around infant great-grandchild to 4 months and talked about RSV testing pending swab.  Patient  to rest drink plenty of fluids symptomatic medication treatment.  Patient was instructed if she does not continue to get better through the week to reach out Friday and we will send in antibiotics likely use doxycycline      Relevant Medications   benzonatate (TESSALON) 100 MG capsule     Other   Acute cough - Primary    Flu and COVID-negative in office.  Pending RSV swab.  Will treat with Tessalon Perles and Mucinex.  Rest drink plenty of fluids follow-up if no improvement      Relevant Medications   guaiFENesin (MUCINEX) 600 MG 12 hr tablet   benzonatate (TESSALON) 100 MG capsule   Other Relevant Orders   POC Influenza A&B(BINAX/QUICKVUE) (Completed)   POC COVID-19 (Completed)   RSV screen (nasopharyngeal)not at [place] ordered this encounter  Medications   DISCONTD: benzonatate (TESSALON) 100 MG capsule    Sig: Take 1 capsule (100 mg total) by mouth 3 (three)  times daily as needed for cough.    Dispense:  21 capsule    Refill:  0    Order Specific Question:   Supervising Provider    Answer:   Loura Pardon A [[id number]]   DISCONTD: guaiFENesin (MUCINEX) 600 MG 12 hr tablet    Sig: Take 1 tablet (600 mg total) by mouth 2 (two) times daily.    Dispense:  14 tablet    Refill:  0    Order Specific Question:   Supervising Provider    Answer:   Glori Bickers MARNE A [[id number]]   guaiFENesin (MUCINEX) 600 MG 12 hr tablet    Sig: Take 1 tablet (600 mg total) by mouth 2 (two) times daily.    Dispense:  14 tablet    Refill:  0    Order Specific Question:   Supervising Provider    Answer:   Glori Bickers MARNE A [[id number]]   benzonatate (TESSALON) 100 MG capsule    Sig: Take 1 capsule (100 mg total) by mouth 3 (three) times daily as needed for cough.    Dispense:  21 capsule    Refill:  0    Order Specific Question:   Supervising Provider    Answer:   TOWER, MARNE A [[id number]]    Return if symptoms worsen or fail to improve.  Romilda Garret, NP

## 2022-02-03 NOTE — Patient Instructions (Signed)
Nice to see you today I have sent in cough medication and mucinex. Drink plenty of fluid and rest. If you do not continue to improve over this week call the office or send me a mychart message and we can do some antibiotics

## 2022-02-03 NOTE — Assessment & Plan Note (Signed)
Flu and COVID-negative in office.  Pending RSV swab.  Will treat with Tessalon Perles and Mucinex.  Rest drink plenty of fluids follow-up if no improvement

## 2022-02-03 NOTE — Telephone Encounter (Signed)
Patient was seen in office today. Nothing further needed at this time.

## 2022-02-04 LAB — RSV SCREEN (NASOPHARYNGEAL) NOT AT ARMC
MICRO NUMBER:: 14377276
RESULT:: NOT DETECTED
SPECIMEN QUALITY:: ADEQUATE

## 2022-02-05 ENCOUNTER — Telehealth: Payer: Self-pay | Admitting: Nurse Practitioner

## 2022-02-05 NOTE — Telephone Encounter (Signed)
Pt stated she saw [facility] on Tues, 1/2 & was told if her symptoms didn't get better to let [facility] know. Pt stated her ear pain isn't better however her cough is better. Pt is asking can she get a referral to Ear doctor? Call back # [phone]

## 2022-02-06 ENCOUNTER — Encounter: Payer: Self-pay | Admitting: Nurse Practitioner

## 2022-02-06 DIAGNOSIS — H6993 Unspecified Eustachian tube disorder, bilateral: Secondary | ICD-10-CM

## 2022-02-06 MED ORDER — FLUTICASONE PROPIONATE 50 MCG/ACT NA SUSP: 2.0000 | Freq: Every day | NASAL | 0 refills | Status: DC

## 2022-02-06 MED ORDER — DOXYCYCLINE HYCLATE 100 MG PO TABS: 100.0000 mg | ORAL_TABLET | Freq: Two times a day (BID) | ORAL | 0 refills | Status: AC

## 2022-02-06 NOTE — Telephone Encounter (Signed)
Responded to her via mychart earlier today. Please let her know what the mychart message said

## 2022-02-10 ENCOUNTER — Ambulatory Visit (INDEPENDENT_AMBULATORY_CARE_PROVIDER_SITE_OTHER): Payer: Medicare PPO | Admitting: Nurse Practitioner

## 2022-02-10 ENCOUNTER — Encounter: Payer: Self-pay | Admitting: Nurse Practitioner

## 2022-02-10 VITALS — BP 118/72 | HR 85 | Ht 63.0 in | Wt 173.0 lb

## 2022-02-10 DIAGNOSIS — H6993 Unspecified Eustachian tube disorder, bilateral: Secondary | ICD-10-CM

## 2022-02-10 DIAGNOSIS — H9191 Unspecified hearing loss, right ear: Secondary | ICD-10-CM | POA: Insufficient documentation

## 2022-02-10 MED ORDER — PREDNISONE 20 MG PO TABS: ORAL_TABLET | ORAL | 0 refills | Status: AC

## 2022-02-10 NOTE — Patient Instructions (Signed)
Nice to see you today Follow up if you do not improve Take medication with food If you do not improve then we will send you to ENT

## 2022-02-10 NOTE — Assessment & Plan Note (Signed)
Patient is here exam normal on office visit.  Will start patient on prednisone taper she will reach out if hearing does not return to baseline and will refer to ENT.

## 2022-02-10 NOTE — Assessment & Plan Note (Signed)
Patient started using the doxycycline along with the fluticasone nasal spray.  Will add on prednisone taper.  Prednisone precautions reviewed

## 2022-02-10 NOTE — Progress Notes (Signed)
Acute Office Visit  Subjective:     Patient ID: ALEERA GILCREASE, female    DOB: [birth year], 77 y.o.   MRN: [id number]  Chief Complaint  Patient presents with   Ear Pain    R side, hearing loss   Nasal Congestion    With pnd    HPI Patient is in today for sick symptoms  Patient was seen by me in office on 02/03/2022.  COVID flu and RSV swab which was all negative.  Patient was given Tessalon Perles and guaifenesin for symptomatic relief.  Instructed to follow-up or reach out if no improvement.  Patient did reach out on 02/06/2022 stating symptoms have not improved I did send in some doxycycline and fluticasone nasal spray.  Patient is here today for follow-up in office  States that she is having hearing loss in the right ear. States that she did pick up the flonase and the antibiotc and is on day 2 of 7. States that she is doing some better.   She also menitons that she had a stinging pain in her eye while at work then after that she did she a floating string in the vision and she has seen it twice and has not come back since.   Review of Systems  Constitutional:  Negative for chills, fever and malaise/fatigue.  HENT:  Positive for ear pain and hearing loss. Negative for ear discharge.   Respiratory:  Positive for cough (has improved). Negative for shortness of breath.   Musculoskeletal:  Negative for joint pain and myalgias.  Neurological:  Negative for headaches.        Objective:    BP 118/72   Pulse 85   Ht '5\' 3"'$  (1.6 m)   Wt 173 lb (78.5 kg)   SpO2 96%   BMI 30.65 kg/m    Physical Exam Vitals and nursing note reviewed.  Constitutional:      Appearance: Normal appearance.  HENT:     Head:     Comments: Fluid behind right TM    Right Ear: Ear canal and external ear normal.     Left Ear: Tympanic membrane, ear canal and external ear normal.     Mouth/Throat:     Mouth: Mucous membranes are moist.     Pharynx: Oropharynx is clear.  Cardiovascular:     Rate and  Rhythm: Normal rate and regular rhythm.     Heart sounds: Normal heart sounds.  Pulmonary:     Effort: Pulmonary effort is normal.     Breath sounds: Normal breath sounds.  Neurological:     Mental Status: She is alert.     No results found for any visits on 02/10/22.      Assessment & Plan:   Problem List Items Addressed This Visit       Nervous and Auditory   Eustachian tube dysfunction, bilateral - Primary    Patient started using the doxycycline along with the fluticasone nasal spray.  Will add on prednisone taper.  Prednisone precautions reviewed      Relevant Medications   predniSONE (DELTASONE) 20 MG tablet   Decreased hearing of right ear    Patient is here exam normal on office visit.  Will start patient on prednisone taper she will reach out if hearing does not return to baseline and will refer to ENT.      Relevant Medications   predniSONE (DELTASONE) 20 MG tablet    Meds ordered this encounter  Medications  predniSONE (DELTASONE) 20 MG tablet    Sig: Take 1 tablet (20 mg total) by mouth 2 (two) times daily with a meal for 4 days, THEN 1 tablet (20 mg total) daily with breakfast for 4 days. Avoid NSAIDs like ibuprofen, motrin, aleve, naproxen, BC/Goodys.    Dispense:  12 tablet    Refill:  0    Order Specific Question:   Supervising Provider    Answer:   TOWER, MARNE A [[id number]]    Return if symptoms worsen or fail to improve.  Romilda Garret, NP

## 2022-02-18 ENCOUNTER — Telehealth: Payer: Self-pay | Admitting: Nurse Practitioner

## 2022-02-18 NOTE — Telephone Encounter (Signed)
Attempted to contact patient. No answer. Voicemail left requesting return call.

## 2022-02-18 NOTE — Telephone Encounter (Signed)
Prescription Request  02/18/2022  Is this a "Controlled Substance" medicine? No  LOV: 02/10/2022  What is the name of the medication or equipment? predniSONE (DELTASONE) 20 MG tablet   Have you contacted your pharmacy to request a refill? No   Which pharmacy would you like this sent to?   [id number] [address], [place] AT [facility] OF [address] [place] [address] Phone: [phone] Fax: [phone]    Patient notified that their request is being sent to the clinical staff for review and that they should receive a response within 2 business days.   Please advise at Mobile [phone] (mobile)

## 2022-02-20 NOTE — Telephone Encounter (Signed)
Patient called in returning call she missed.

## 2022-02-20 NOTE — Telephone Encounter (Signed)
LVM for pt to return call

## 2022-02-20 NOTE — Telephone Encounter (Signed)
If no improvement with 8 days of steroids then she needs to be seen by ENT. I assume [place] location, please verify

## 2022-02-20 NOTE — Telephone Encounter (Signed)
Spoke with patient and she states that she is still having pain and weird sounds in her right ear. She states she has not improved much at all. She would like a refill on the prednisone, she has finished abtx.  Please advise, thank you!

## 2022-02-24 NOTE — Telephone Encounter (Signed)
MyChart message sent to patient:  Monica Lindsey, We have been trying to reach you regarding your issue of right ear pain. Matt recommends a consultation with an Ear, Nose and Throat provider to see if they have any additional recommendations or treatment options. Please let us know if you would like to proceed with this referral.    Thank you and stay well, Olar

## 2022-02-27 ENCOUNTER — Encounter: Payer: Self-pay | Admitting: Cardiology

## 2022-02-27 ENCOUNTER — Other Ambulatory Visit
Admission: RE | Admit: 2022-02-27 | Discharge: 2022-02-27 | Disposition: A | Discharge status: Home / Self Care | Payer: Medicare PPO | Source: Ambulatory Visit | Attending: Cardiovascular Disease | Admitting: Cardiovascular Disease

## 2022-02-27 ENCOUNTER — Ambulatory Visit: Payer: Medicare PPO | Attending: Cardiovascular Disease | Admitting: Cardiology

## 2022-02-27 VITALS — BP 118/84 | HR 100 | Ht 63.0 in | Wt 172.2 lb

## 2022-02-27 DIAGNOSIS — I4819 Other persistent atrial fibrillation: Secondary | ICD-10-CM | POA: Diagnosis not present

## 2022-02-27 DIAGNOSIS — E7800 Pure hypercholesterolemia, unspecified: Secondary | ICD-10-CM

## 2022-02-27 DIAGNOSIS — I10 Essential (primary) hypertension: Secondary | ICD-10-CM | POA: Diagnosis not present

## 2022-02-27 LAB — CBC
HCT: 48.4 % — ABNORMAL HIGH (ref 36.0–46.0)
Hemoglobin: 15.9 g/dL — ABNORMAL HIGH (ref 12.0–15.0)
MCH: 28.8 pg (ref 26.0–34.0)
MCHC: 32.9 g/dL (ref 30.0–36.0)
MCV: 87.7 fL (ref 80.0–100.0)
Platelets: 209 10*3/uL (ref 150–400)
RBC: 5.52 MIL/uL — ABNORMAL HIGH (ref 3.87–5.11)
RDW: 13.3 % (ref 11.5–15.5)
WBC: 8.9 10*3/uL (ref 4.0–10.5)
nRBC: 0 % (ref 0.0–0.2)

## 2022-02-27 LAB — BASIC METABOLIC PANEL
Anion gap: 10 (ref 5–15)
BUN: 26 mg/dL — ABNORMAL HIGH (ref 8–23)
CO2: 26 mmol/L (ref 22–32)
Calcium: 10.2 mg/dL (ref 8.9–10.3)
Chloride: 103 mmol/L (ref 98–111)
Creatinine, Ser: 1.32 mg/dL — ABNORMAL HIGH (ref 0.44–1.00)
GFR, Estimated: 41 mL/min — ABNORMAL LOW (ref >60)
Glucose, Bld: 105 mg/dL — ABNORMAL HIGH (ref 70–99)
Potassium: 4.4 mmol/L (ref 3.5–5.1)
Sodium: 139 mmol/L (ref 135–145)

## 2022-02-27 NOTE — Patient Instructions (Addendum)
Medication Instructions:   Your physician recommends that you continue on your current medications as directed. Please refer to the Current Medication list given to you today.   *If you need a refill on your cardiac medications before your next appointment, please call your pharmacy*   Lab Work:  Your physician recommends you go to the medical mall for lab work - BMP / CBC  If you have labs (blood work) drawn today and your tests are completely normal, you will receive your results only by: MyChart Message (if you have MyChart) OR A paper copy in the mail If you have any lab test that is abnormal or we need to change your treatment, we will call you to review the results.   Testing/Procedures:  You are scheduled for a Cardioversion on ___January 31st_____________ with Dr.__Agbor-Etang_________ Please arrive at the [facility] at _6:30________ a.m. on the day of your procedure.  DIET INSTRUCTIONS:  Nothing to eat or drink after midnight except your medications with a safe sip of water  Labs: __TODAY________________ Medications:  YOU MAY TAKE ALL of your remaining medications with a small amount of water. Must have a responsible person to drive you home. Bring a current list of your medications and current insurance cards.  If you have any questions after you get home, please call the office at [phone]    2. Your physician has requested that you have an echocardiogram. Echocardiography is a painless test that uses sound waves to create images of your heart. It provides your doctor with information about the size and shape of your heart and how well your heart's chambers and valves are working. This procedure takes approximately one hour. There are no restrictions for this procedure. Please do NOT wear cologne, perfume, aftershave, or lotions (deodorant is allowed). Please arrive 15 minutes prior to your appointment time.  3. We will order CT coronary calcium score $99 at  our [facility] in [place]  Please call Colletta Maryland @ [phone] to schedule  [address], [place] [address]     Follow-Up: At [facility], you and your health needs are our priority.  As part of our continuing mission to provide you with exceptional heart care, we have created designated Provider Care Teams.  These Care Teams include your primary Cardiologist (physician) and Advanced Practice Providers (APPs -  Physician Assistants and Nurse Practitioners) who all work together to provide you with the care you need, when you need it.  We recommend signing up for the patient portal called "MyChart".  Sign up information is provided on this After Visit Summary.  MyChart is used to connect with patients for Virtual Visits (Telemedicine).  Patients are able to view lab/test results, encounter notes, upcoming appointments, etc.  Non-urgent messages can be sent to your provider as well.   To learn more about what you can do with MyChart, go to [email or web address].    Your next appointment:   2 week(s) after Cardioversion  Provider:   You may see Kate Sable, MD or one of the following Advanced Practice Providers on your designated Care Team:   Murray Hodgkins, NP Christell Faith, PA-C Cadence Kathlen Mody, PA-C Gerrie Nordmann, NP

## 2022-02-27 NOTE — H&P (View-Only) (Signed)
Cardiology Office Note:    Date:  02/27/2022   ID:  Monica Lindsey, DOB [birth year], MRN [id number]  PCP:  Michela Pitcher, NP   [facility] Cardiologist:  Kate Sable, MD     Referring MD: Michela Pitcher, NP   Chief Complaint  Patient presents with   New Patient (Initial Visit)    Afib, SOB, occasional leg swelling, Nohx    History of Present Illness:    Monica Lindsey is a 77 y.o. female with a hx of atrial fibrillation, hypertension, hyperlipidemia presents into atrial fibrillation.  Patient saw his primary care provider on 10/23, noted to have irregular heart rhythm on exam.  EKG showed atrial fibrillation.  Started on metoprolol and Eliquis.  Denies any bleeding issues with Eliquis has been on Eliquis for at least 2 months now.  She states diagnosed with COVID infection about 3 months ago, has been having symptoms of shortness of breath with minimal exertion.  Denies edema, orthopnea, presyncope or syncope.  Has occasional palpitations and 1 episode of sharp chest pain last evening, has not recurred.  Blood pressures have been controlled.  Has been on Pravachol for years now, wondering if she can stop taking statin.  Past Medical History:  Diagnosis Date   Arthritis    Constipation    Frequent headaches    Hay fever    Heel spur    Hyperlipidemia    Hypertension     Past Surgical History:  Procedure Laterality Date   ABDOMINAL HYSTERECTOMY     parialt ovaries intact   BACK SURGERY  02/02/1981   CARPOMETACARPAL ([facility]) FUSION OF THUMB Right 04/07/2017   Procedure: CARPOMETACARPAL ([place]) FUSION OF THUMB;  Surgeon: Earnestine Leys, MD;  Location: [facility] ORS;  Service: Orthopedics;  Laterality: Right;   COLONOSCOPY     COLONOSCOPY WITH PROPOFOL N/A 09/10/2020   Procedure: COLONOSCOPY WITH PROPOFOL;  Surgeon: [name], MD;  Location: [facility] ENDOSCOPY;  Service: Endoscopy;  Laterality: N/A;   HEMORRHOID SURGERY     TOOTH EXTRACTION      Current  Medications: Current Meds  Medication Sig   amLODipine (NORVASC) 5 MG tablet TAKE 1 TABLET DAILY.   apixaban (ELIQUIS) 5 MG TABS tablet Take 1 tablet (5 mg total) by mouth 2 (two) times daily.   Cyanocobalamin (VITAMIN B-12 PO) Place 1 tablet under the tongue daily.   hydrochlorothiazide (HYDRODIURIL) 25 MG tablet TAKE 1 TABLET DAILY.   metoprolol tartrate (LOPRESSOR) 25 MG tablet Take 1 tablet (25 mg total) by mouth 2 (two) times daily.   Multiple Vitamin (MULTIVITAMIN) tablet Take 1 tablet by mouth daily.   Omega-3 Fatty Acids (FISH OIL) 1000 MG CAPS Take 1,000 mg by mouth 2 (two) times daily.   pravastatin (PRAVACHOL) 40 MG tablet Take 1 tablet (40 mg total) by mouth daily.   Vitamin D, Cholecalciferol, 25 MCG (1000 UT) TABS Take by mouth.     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   Widowed.      Still working 3 days per week.  Very close to her children and grand children.      Daughter, Monica Lindsey, is HPOA.  Has a living will.   Would desire CPR, would not desire prolonged life support if futile.            Social Determinants of Health   Financial Resource Strain: Low Risk  (08/01/2021)   Overall Financial Resource Strain (CARDIA)    Difficulty of Paying Living Expenses: Not hard at all  Food Insecurity: No Food Insecurity (08/01/2021)   Hunger Vital Sign    Worried About  Running Out of Food in the Last Year: Never true    Ran Out of Food in the Last Year: Never true  Transportation Needs: No Transportation Needs (08/01/2021)   PRAPARE - Hydrologist (Medical): No    Lack of Transportation (Non-Medical): No  Physical Activity: Insufficiently Active (08/01/2021)   Exercise Vital Sign    Days of Exercise per Week: 2 days    Minutes of Exercise per Session: 30 min  Stress: No Stress Concern Present (08/01/2021)   [place]    Feeling of Stress : Not at all  Social Connections: Moderately Integrated (08/01/2021)   Social Connection and Isolation Panel [NHANES]    Frequency of Communication with Friends and Family: More than three times a week    Frequency of Social Gatherings with Friends and Family: More than three times a week    Attends Religious Services: More than 4 times per year    Active Member of [employer] or Organizations: Yes    Attends Archivist Meetings: More than 4 times per year    Marital Status: Widowed     Family History: The patient's family history includes Alcohol abuse in her father; Arthritis in her father; Brain cancer in her sister; Breast cancer in her cousin; Breast cancer (age of onset: 37) in her paternal aunt; Heart disease in her father and mother; Hypertension in her father; Stroke in her father.  ROS:   Please see the history of present illness.     All other systems reviewed and are negative.  EKGs/Labs/Other Studies Reviewed:    The following studies were reviewed today:   EKG:  EKG is  ordered today.  The ekg ordered today demonstrates atrial fibrillation, heart rate 100.  Recent Labs: 11/26/2021: ALT 33; Pro B Natriuretic peptide (BNP) 189.0; TSH 0.82 02/27/2022: BUN 26; Creatinine, Ser 1.32; Hemoglobin 15.9; Platelets 209; Potassium 4.4; Sodium 139  Recent Lipid Panel    Component Value Date/Time   CHOL 211 (H)  10/03/2021 0935   TRIG 121.0 10/03/2021 0935   HDL 59.60 10/03/2021 0935   CHOLHDL 4 10/03/2021 0935   VLDL 24.2 10/03/2021 0935   LDLCALC 127 (H) 10/03/2021 0935   LDLDIRECT 127.3 03/23/2013 1459     Risk Assessment/Calculations:             Physical Exam:    VS:  BP 118/84 (BP Location: Right Arm)   Pulse 100   Ht '5\' 3"'$  (1.6 m)   Wt 172 lb 3.2 oz (78.1 kg)   SpO2 98%  BMI 30.50 kg/m     Wt Readings from Last 3 Encounters:  02/27/22 172 lb 3.2 oz (78.1 kg)  02/10/22 173 lb (78.5 kg)  02/03/22 176 lb 9.6 oz (80.1 kg)     GEN:  Well nourished, well developed in no acute distress HEENT: Normal NECK: No JVD; No carotid bruits CARDIAC: Irregular irregular RESPIRATORY:  Clear to auscultation without rales, wheezing or rhonchi  ABDOMEN: Soft, non-tender, non-distended MUSCULOSKELETAL:  No edema; No deformity  SKIN: Warm and dry NEUROLOGIC:  Alert and oriented x 3 PSYCHIATRIC:  Normal affect   ASSESSMENT:    1. Persistent atrial fibrillation ([place])   2. Primary hypertension   3. Pure hypercholesterolemia    PLAN:    In order of problems listed above:  Persistent atrial fibrillation, occasional shortness of breath, palpitations.  Has been on Eliquis uninterrupted x 2 months.  Plan DC cardioversion.  Continue Lopressor 25 mg twice daily, Eliquis 5 mg twice daily.  Refer to A-fib clinic.  Get echocardiogram. Hypertension, BP controlled.  Continue Lopressor, HCTZ, Norvasc. Hyperlipidemia, continue Pravachol.  Obtain calcium scan.  Follow-up after cardiac testing.      Shared Decision Making/Informed Consent The risks (stroke, cardiac arrhythmias rarely resulting in the need for a temporary or permanent pacemaker, skin irritation or burns and complications associated with conscious sedation including aspiration, arrhythmia, respiratory failure and death), benefits (restoration of normal sinus rhythm) and alternatives of a direct current cardioversion were explained in  detail to Ms. Leng and she agrees to proceed.      Medication Adjustments/Labs and Tests Ordered: Current medicines are reviewed at length with the patient today.  Concerns regarding medicines are outlined above.  Orders Placed This Encounter  Procedures   CT CARDIAC SCORING (SELF PAY ONLY)   Basic Metabolic Panel (BMET)   CBC   Ambulatory referral to Cardiac Electrophysiology   EKG 12-Lead   ECHOCARDIOGRAM COMPLETE   No orders of the defined types were placed in this encounter.   Patient Instructions  Medication Instructions:   Your physician recommends that you continue on your current medications as directed. Please refer to the Current Medication list given to you today.   *If you need a refill on your cardiac medications before your next appointment, please call your pharmacy*   Lab Work:  Your physician recommends you go to the medical mall for lab work - BMP / CBC  If you have labs (blood work) drawn today and your tests are completely normal, you will receive your results only by: MyChart Message (if you have MyChart) OR A paper copy in the mail If you have any lab test that is abnormal or we need to change your treatment, we will call you to review the results.   Testing/Procedures:  You are scheduled for a Cardioversion on ___January 31st_____________ with Dr.__Agbor-Etang_________ Please arrive at the [facility] at _6:30________ a.m. on the day of your procedure.  DIET INSTRUCTIONS:  Nothing to eat or drink after midnight except your medications with a safe sip of water  Labs: __TODAY________________ Medications:  YOU MAY TAKE ALL of your remaining medications with a small amount of water. Must have a responsible person to drive you home. Bring a current list of your medications and current insurance cards.  If you have any questions after you get home, please call the office at [phone]    2. Your physician has requested that you have an  echocardiogram. Echocardiography is a painless test that uses  sound waves to create images of your heart. It provides your doctor with information about the size and shape of your heart and how well your heart's chambers and valves are working. This procedure takes approximately one hour. There are no restrictions for this procedure. Please do NOT wear cologne, perfume, aftershave, or lotions (deodorant is allowed). Please arrive 15 minutes prior to your appointment time.  3. We will order CT coronary calcium score $99 at our [facility] in [place]  Please call Colletta Maryland @ [phone] to schedule  [address], [place] [address]     Follow-Up: At [facility], you and your health needs are our priority.  As part of our continuing mission to provide you with exceptional heart care, we have created designated Provider Care Teams.  These Care Teams include your primary Cardiologist (physician) and Advanced Practice Providers (APPs -  Physician Assistants and Nurse Practitioners) who all work together to provide you with the care you need, when you need it.  We recommend signing up for the patient portal called "MyChart".  Sign up information is provided on this After Visit Summary.  MyChart is used to connect with patients for Virtual Visits (Telemedicine).  Patients are able to view lab/test results, encounter notes, upcoming appointments, etc.  Non-urgent messages can be sent to your provider as well.   To learn more about what you can do with MyChart, go to [email or web address].    Your next appointment:   2 week(s) after Cardioversion  Provider:   You may see Kate Sable, MD or one of the following Advanced Practice Providers on your designated Care Team:   Murray Hodgkins, NP Christell Faith, PA-C Cadence Kathlen Mody, PA-C Gerrie Nordmann, NP   Signed, Kate Sable, MD  02/27/2022 12:33 PM    [place]

## 2022-02-27 NOTE — Progress Notes (Signed)
Cardiology Office Note:    Date:  02/27/2022   ID:  Monica Lindsey, DOB 1945/09/03, MRN [id number]  PCP:  Michela Pitcher, NP   [facility] Cardiologist:  Kate Sable, MD     Referring MD: Michela Pitcher, NP   Chief Complaint  Patient presents with   New Patient (Initial Visit)    Afib, SOB, occasional leg swelling, Nohx    History of Present Illness:    Monica Lindsey is a 77 y.o. female with a hx of atrial fibrillation, hypertension, hyperlipidemia presents into atrial fibrillation.  Patient saw his primary care provider on 10/23, noted to have irregular heart rhythm on exam.  EKG showed atrial fibrillation.  Started on metoprolol and Eliquis.  Denies any bleeding issues with Eliquis has been on Eliquis for at least 2 months now.  She states diagnosed with COVID infection about 3 months ago, has been having symptoms of shortness of breath with minimal exertion.  Denies edema, orthopnea, presyncope or syncope.  Has occasional palpitations and 1 episode of sharp chest pain last evening, has not recurred.  Blood pressures have been controlled.  Has been on Pravachol for years now, wondering if she can stop taking statin.  Past Medical History:  Diagnosis Date   Arthritis    Constipation    Frequent headaches    Hay fever    Heel spur    Hyperlipidemia    Hypertension     Past Surgical History:  Procedure Laterality Date   ABDOMINAL HYSTERECTOMY     parialt ovaries intact   BACK SURGERY  02/02/1981   CARPOMETACARPAL ([facility]) FUSION OF THUMB Right 04/07/2017   Procedure: CARPOMETACARPAL ([place]) FUSION OF THUMB;  Surgeon: Earnestine Leys, MD;  Location: [facility] ORS;  Service: Orthopedics;  Laterality: Right;   COLONOSCOPY     COLONOSCOPY WITH PROPOFOL N/A 09/10/2020   Procedure: COLONOSCOPY WITH PROPOFOL;  Surgeon: [name], MD;  Location: [facility] ENDOSCOPY;  Service: Endoscopy;  Laterality: N/A;   HEMORRHOID SURGERY     TOOTH EXTRACTION      Current  Medications: Current Meds  Medication Sig   amLODipine (NORVASC) 5 MG tablet TAKE 1 TABLET DAILY.   apixaban (ELIQUIS) 5 MG TABS tablet Take 1 tablet (5 mg total) by mouth 2 (two) times daily.   Cyanocobalamin (VITAMIN B-12 PO) Place 1 tablet under the tongue daily.   hydrochlorothiazide (HYDRODIURIL) 25 MG tablet TAKE 1 TABLET DAILY.   metoprolol tartrate (LOPRESSOR) 25 MG tablet Take 1 tablet (25 mg total) by mouth 2 (two) times daily.   Multiple Vitamin (MULTIVITAMIN) tablet Take 1 tablet by mouth daily.   Omega-3 Fatty Acids (FISH OIL) 1000 MG CAPS Take 1,000 mg by mouth 2 (two) times daily.   pravastatin (PRAVACHOL) 40 MG tablet Take 1 tablet (40 mg total) by mouth daily.   Vitamin D, Cholecalciferol, 25 MCG (1000 UT) TABS Take by mouth.     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   Widowed.      Still working 3 days per week.  Very close to her children and grand children.      Daughter, [name], is HPOA.  Has a living will.   Would desire CPR, would not desire prolonged life support if futile.            Social Determinants of Health   Financial Resource Strain: Low Risk  (08/01/2021)   Overall Financial Resource Strain (CARDIA)    Difficulty of Paying Living Expenses: Not hard at all  Food Insecurity: No Food Insecurity (08/01/2021)   Hunger Vital Sign    Worried About  Running Out of Food in the Last Year: Never true    Ran Out of Food in the Last Year: Never true  Transportation Needs: No Transportation Needs (08/01/2021)   PRAPARE - Hydrologist (Medical): No    Lack of Transportation (Non-Medical): No  Physical Activity: Insufficiently Active (08/01/2021)   Exercise Vital Sign    Days of Exercise per Week: 2 days    Minutes of Exercise per Session: 30 min  Stress: No Stress Concern Present (08/01/2021)   [place]    Feeling of Stress : Not at all  Social Connections: Moderately Integrated (08/01/2021)   Social Connection and Isolation Panel [NHANES]    Frequency of Communication with Friends and Family: More than three times a week    Frequency of Social Gatherings with Friends and Family: More than three times a week    Attends Religious Services: More than 4 times per year    Active Member of [employer] or Organizations: Yes    Attends Archivist Meetings: More than 4 times per year    Marital Status: Widowed     Family History: The patient's family history includes Alcohol abuse in her father; Arthritis in her father; Brain cancer in her sister; Breast cancer in her cousin; Breast cancer (age of onset: 78) in her paternal aunt; Heart disease in her father and mother; Hypertension in her father; Stroke in her father.  ROS:   Please see the history of present illness.     All other systems reviewed and are negative.  EKGs/Labs/Other Studies Reviewed:    The following studies were reviewed today:   EKG:  EKG is  ordered today.  The ekg ordered today demonstrates atrial fibrillation, heart rate 100.  Recent Labs: 11/26/2021: ALT 33; Pro B Natriuretic peptide (BNP) 189.0; TSH 0.82 02/27/2022: BUN 26; Creatinine, Ser 1.32; Hemoglobin 15.9; Platelets 209; Potassium 4.4; Sodium 139  Recent Lipid Panel    Component Value Date/Time   CHOL 211 (H)  10/03/2021 0935   TRIG 121.0 10/03/2021 0935   HDL 59.60 10/03/2021 0935   CHOLHDL 4 10/03/2021 0935   VLDL 24.2 10/03/2021 0935   LDLCALC 127 (H) 10/03/2021 0935   LDLDIRECT 127.3 03/23/2013 1459     Risk Assessment/Calculations:             Physical Exam:    VS:  BP 118/84 (BP Location: Right Arm)   Pulse 100   Ht '5\' 3"'$  (1.6 m)   Wt 172 lb 3.2 oz (78.1 kg)   SpO2 98%  BMI 30.50 kg/m     Wt Readings from Last 3 Encounters:  02/27/22 172 lb 3.2 oz (78.1 kg)  02/10/22 173 lb (78.5 kg)  02/03/22 176 lb 9.6 oz (80.1 kg)     GEN:  Well nourished, well developed in no acute distress HEENT: Normal NECK: No JVD; No carotid bruits CARDIAC: Irregular irregular RESPIRATORY:  Clear to auscultation without rales, wheezing or rhonchi  ABDOMEN: Soft, non-tender, non-distended MUSCULOSKELETAL:  No edema; No deformity  SKIN: Warm and dry NEUROLOGIC:  Alert and oriented x 3 PSYCHIATRIC:  Normal affect   ASSESSMENT:    1. Persistent atrial fibrillation ([place])   2. Primary hypertension   3. Pure hypercholesterolemia    PLAN:    In order of problems listed above:  Persistent atrial fibrillation, occasional shortness of breath, palpitations.  Has been on Eliquis uninterrupted x 2 months.  Plan DC cardioversion.  Continue Lopressor 25 mg twice daily, Eliquis 5 mg twice daily.  Refer to A-fib clinic.  Get echocardiogram. Hypertension, BP controlled.  Continue Lopressor, HCTZ, Norvasc. Hyperlipidemia, continue Pravachol.  Obtain calcium scan.  Follow-up after cardiac testing.      Shared Decision Making/Informed Consent The risks (stroke, cardiac arrhythmias rarely resulting in the need for a temporary or permanent pacemaker, skin irritation or burns and complications associated with conscious sedation including aspiration, arrhythmia, respiratory failure and death), benefits (restoration of normal sinus rhythm) and alternatives of a direct current cardioversion were explained in  detail to Monica Lindsey and she agrees to proceed.      Medication Adjustments/Labs and Tests Ordered: Current medicines are reviewed at length with the patient today.  Concerns regarding medicines are outlined above.  Orders Placed This Encounter  Procedures   CT CARDIAC SCORING (SELF PAY ONLY)   Basic Metabolic Panel (BMET)   CBC   Ambulatory referral to Cardiac Electrophysiology   EKG 12-Lead   ECHOCARDIOGRAM COMPLETE   No orders of the defined types were placed in this encounter.   Patient Instructions  Medication Instructions:   Your physician recommends that you continue on your current medications as directed. Please refer to the Current Medication list given to you today.   *If you need a refill on your cardiac medications before your next appointment, please call your pharmacy*   Lab Work:  Your physician recommends you go to the medical mall for lab work - BMP / CBC  If you have labs (blood work) drawn today and your tests are completely normal, you will receive your results only by: MyChart Message (if you have MyChart) OR A paper copy in the mail If you have any lab test that is abnormal or we need to change your treatment, we will call you to review the results.   Testing/Procedures:  You are scheduled for a Cardioversion on ___January 31st_____________ with Dr.__Agbor-Etang_________ Please arrive at the [facility] at _6:30________ a.m. on the day of your procedure.  DIET INSTRUCTIONS:  Nothing to eat or drink after midnight except your medications with a safe sip of water  Labs: __TODAY________________ Medications:  YOU MAY TAKE ALL of your remaining medications with a small amount of water. Must have a responsible person to drive you home. Bring a current list of your medications and current insurance cards.  If you have any questions after you get home, please call the office at [phone]    2. Your physician has requested that you have an  echocardiogram. Echocardiography is a painless test that uses  sound waves to create images of your heart. It provides your doctor with information about the size and shape of your heart and how well your heart's chambers and valves are working. This procedure takes approximately one hour. There are no restrictions for this procedure. Please do NOT wear cologne, perfume, aftershave, or lotions (deodorant is allowed). Please arrive 15 minutes prior to your appointment time.  3. We will order CT coronary calcium score $99 at our [facility] in [place]  Please call [place]ta [place] @ [phone] to schedule  [address] [place], [place] [address]     Follow-Up: At [facility], you and your health needs are our priority.  As part of our continuing mission to provide you with exceptional heart care, we have created designated Provider Care Teams.  These Care Teams include your primary Cardiologist (physician) and Advanced Practice Providers (APPs -  Physician Assistants and Nurse Practitioners) who all work together to provide you with the care you need, when you need it.  We recommend signing up for the patient portal called "MyChart".  Sign up information is provided on this After Visit Summary.  MyChart is used to connect with patients for Virtual Visits (Telemedicine).  Patients are able to view lab/test results, encounter notes, upcoming appointments, etc.  Non-urgent messages can be sent to your provider as well.   To learn more about what you can do with MyChart, go to [email or web address].    Your next appointment:   2 week(s) after Cardioversion  Provider:   You may see Kate Sable, MD or one of the following Advanced Practice Providers on your designated Care Team:   Murray Hodgkins, NP Christell Faith, PA-C Cadence Kathlen Mody, PA-C Gerrie Nordmann, NP   Signed, Kate Sable, MD  02/27/2022 12:33 PM    [place]

## 2022-03-03 ENCOUNTER — Telehealth: Payer: Self-pay

## 2022-03-03 ENCOUNTER — Ambulatory Visit
Admission: RE | Admit: 2022-03-03 | Discharge: 2022-03-03 | Disposition: A | Discharge status: Home / Self Care | Payer: Medicare PPO | Source: Ambulatory Visit | Attending: Cardiology | Admitting: Cardiology

## 2022-03-03 DIAGNOSIS — E7800 Pure hypercholesterolemia, unspecified: Secondary | ICD-10-CM | POA: Insufficient documentation

## 2022-03-03 DIAGNOSIS — I4819 Other persistent atrial fibrillation: Secondary | ICD-10-CM

## 2022-03-03 DIAGNOSIS — I10 Essential (primary) hypertension: Secondary | ICD-10-CM | POA: Insufficient documentation

## 2022-03-03 NOTE — Telephone Encounter (Signed)
Called patient.  Patient was agreeable.  Labs placed.

## 2022-03-03 NOTE — Telephone Encounter (Signed)
-----  Message from Kate Sable, MD sent at 03/02/2022  5:00 PM EST ----- Labs look okay, creatinine slightly worse from 3 months ago.  Likely from dehydration.  Will plan cardioversion as scheduled.  Recheck BMP in 1 week, if creatinine is still abnormal we will plan to reduce HCTZ dosing.

## 2022-03-04 ENCOUNTER — Ambulatory Visit
Admission: RE | Admit: 2022-03-04 | Discharge: 2022-03-04 | Disposition: A | Discharge status: Home / Self Care | Payer: Medicare PPO | Attending: Cardiology | Admitting: Cardiology

## 2022-03-04 ENCOUNTER — Encounter
Admission: RE | Disposition: A | Discharge status: Home / Self Care | Payer: Self-pay | Source: Home / Self Care | Attending: Cardiology

## 2022-03-04 ENCOUNTER — Ambulatory Visit: Payer: Medicare PPO | Admitting: Anesthesiology

## 2022-03-04 ENCOUNTER — Encounter: Payer: Self-pay | Admitting: Cardiology

## 2022-03-04 ENCOUNTER — Other Ambulatory Visit: Payer: Self-pay

## 2022-03-04 ENCOUNTER — Telehealth: Payer: Self-pay

## 2022-03-04 DIAGNOSIS — M1990 Unspecified osteoarthritis, unspecified site: Secondary | ICD-10-CM | POA: Diagnosis not present

## 2022-03-04 DIAGNOSIS — I10 Essential (primary) hypertension: Secondary | ICD-10-CM | POA: Insufficient documentation

## 2022-03-04 DIAGNOSIS — K5900 Constipation, unspecified: Secondary | ICD-10-CM | POA: Diagnosis not present

## 2022-03-04 DIAGNOSIS — I4891 Unspecified atrial fibrillation: Secondary | ICD-10-CM | POA: Diagnosis not present

## 2022-03-04 DIAGNOSIS — E785 Hyperlipidemia, unspecified: Secondary | ICD-10-CM | POA: Diagnosis not present

## 2022-03-04 DIAGNOSIS — R0602 Shortness of breath: Secondary | ICD-10-CM | POA: Insufficient documentation

## 2022-03-04 DIAGNOSIS — R519 Headache, unspecified: Secondary | ICD-10-CM | POA: Insufficient documentation

## 2022-03-04 DIAGNOSIS — I4819 Other persistent atrial fibrillation: Secondary | ICD-10-CM | POA: Insufficient documentation

## 2022-03-04 HISTORY — PX: CARDIOVERSION: SHX1299

## 2022-03-04 SURGERY — CARDIOVERSION
Anesthesia: General

## 2022-03-04 MED ORDER — AMIODARONE HCL 200 MG PO TABS: ORAL_TABLET | ORAL | 3 refills | Status: DC

## 2022-03-04 MED ORDER — PROPOFOL 10 MG/ML IV BOLUS
INTRAVENOUS | Status: DC | PRN
  Administered 2022-03-04: 50 mg via INTRAVENOUS

## 2022-03-04 MED ORDER — SODIUM CHLORIDE 0.9 % IV SOLN: INTRAVENOUS | Status: DC

## 2022-03-04 NOTE — Interval H&P Note (Signed)
History and Physical Interval Note:  03/04/2022 7:41 AM  Monica Lindsey  has presented today for surgery, with the diagnosis of  Afib.  The various methods of treatment have been discussed with the patient and family. After consideration of risks, benefits and other options for treatment, the patient has consented to  Procedure(s): CARDIOVERSION (N/A) as a surgical intervention.  The patient's history has been reviewed, patient examined, no change in status, stable for surgery.  I have reviewed the patient's chart and labs.  Questions were answered to the patient's satisfaction.     Aaron Edelman Agbor-Etang

## 2022-03-04 NOTE — Anesthesia Preprocedure Evaluation (Signed)
Anesthesia Evaluation  Patient identified by MRN, date of birth, ID band Patient awake    Reviewed: Allergy & Precautions, NPO status , Patient's Chart, lab work & pertinent test results  History of Anesthesia Complications (+) history of anesthetic complications  Airway Mallampati: III  TM Distance: >3 FB Neck ROM: full    Dental  (+) Chipped   Pulmonary neg pulmonary ROS, neg shortness of breath   Pulmonary exam normal        Cardiovascular Exercise Tolerance: Good hypertension, (-) Past MI + dysrhythmias Atrial Fibrillation  Rhythm:irregular     Neuro/Psych  Headaches  Neuromuscular disease  negative psych ROS   GI/Hepatic negative GI ROS, Neg liver ROS,neg GERD  ,,  Endo/Other  negative endocrine ROS    Renal/GU negative Renal ROS  negative genitourinary   Musculoskeletal   Abdominal   Peds  Hematology negative hematology ROS (+)   Anesthesia Other Findings Past Medical History: No date: Arthritis No date: Constipation No date: Frequent headaches No date: Hay fever No date: Heel spur No date: Hyperlipidemia No date: Hypertension  Past Surgical History: No date: ABDOMINAL HYSTERECTOMY     Comment:  parialt ovaries intact 02/02/1981: BACK SURGERY 04/07/2017: CARPOMETACARPAL ([facility]) FUSION OF THUMB; Right     Comment:  Procedure: CARPOMETACARPAL [facility]) FUSION OF THUMB;                Surgeon: Earnestine Leys, MD;  Location: [facility] ORS;                Service: Orthopedics;  Laterality: Right; No date: COLONOSCOPY 09/10/2020: COLONOSCOPY WITH PROPOFOL; N/A     Comment:  Procedure: COLONOSCOPY WITH PROPOFOL;  Surgeon: [name], MD;  Location: [facility] ENDOSCOPY;  Service:               Endoscopy;  Laterality: N/A; No date: HEMORRHOID SURGERY No date: TOOTH EXTRACTION  BMI    Body Mass Index: 30.47 kg/m      Reproductive/Obstetrics negative OB ROS                              Anesthesia Physical Anesthesia Plan  ASA: 3  Anesthesia Plan: General   Post-op Pain Management:    Induction: Intravenous  PONV Risk Score and Plan: Propofol infusion and TIVA  Airway Management Planned: Natural Airway and Nasal Cannula  Additional Equipment:   Intra-op Plan:   Post-operative Plan:   Informed Consent: I have reviewed the patients History and Physical, chart, labs and discussed the procedure including the risks, benefits and alternatives for the proposed anesthesia with the patient or authorized representative who has indicated his/her understanding and acceptance.     Dental Advisory Given  Plan Discussed with: Anesthesiologist, CRNA and Surgeon  Anesthesia Plan Comments: (Patient consented for risks of anesthesia including but not limited to:  - adverse reactions to medications - risk of airway placement if required - damage to eyes, teeth, lips or other oral mucosa - nerve damage due to positioning  - sore throat or hoarseness - Damage to heart, brain, nerves, lungs, other parts of body or loss of life  Patient voiced understanding.)       Anesthesia Quick Evaluation

## 2022-03-04 NOTE — Anesthesia Postprocedure Evaluation (Signed)
Anesthesia Post Note  Patient: Monica Lindsey  Procedure(s) Performed: CARDIOVERSION  Patient location during evaluation: Spec[facility] Recovery Anesthesia Type: General Level of consciousness: awake and alert Pain management: pain level controlled Vital Signs Assessment: post-procedure vital signs reviewed and stable Respiratory status: spontaneous breathing, nonlabored ventilation, respiratory function stable and patient connected to nasal cannula oxygen Cardiovascular status: blood pressure returned to baseline and stable Postop Assessment: no apparent nausea or vomiting Anesthetic complications: no   No notable events documented.   Last Vitals:  Vitals:   03/04/22 0740 03/04/22 0742  BP:  105/79  Pulse: 83   Resp: (!) 26   Temp:    SpO2: 97%     Last Pain:  Vitals:   03/04/22 0658  TempSrc: Oral  PainSc: 0-No pain                 Precious Haws Marshaun Lortie

## 2022-03-04 NOTE — Procedures (Addendum)
Cardioversion procedure note For atrial fibrillation.  Procedure Details:  Consent: Risks of procedure as well as the alternatives and risks of each were explained to the (patient/caregiver).  Consent for procedure obtained.  Time Out: Verified patient identification, verified procedure, site/side was marked, verified correct patient position, special equipment/implants available, medications/allergies/relevent history reviewed, required imaging and test results available.  Performed  Patient placed on cardiac monitor, pulse oximetry, supplemental oxygen as necessary.   Sedation given: propofol per anesthesia team. Pacer pads placed anterior and posterior chest.   Cardioverted 2 time(s).   Cardioverted at  [place]. Synchronized biphasic Unsuccessful.    Evaluation: Findings: Post procedure EKG shows: atrial fibrillation Complications: None Patient did tolerate procedure well.  Time Spent Directly with the Patient:   Recommendations: Cont lopressor, eliquis -heart rate controlled. -start amio '400mg'$  bid with taper over 2 weeks to '200mg'$  daily -keep appointment with afib clinic.  35 minutes   Kate Sable, M.D.

## 2022-03-04 NOTE — Telephone Encounter (Signed)
Secure chat from Dr. Mylo Red- Etang  GM. can you please call in amio '400mg'$  bid x 7days, then '200mg'$  bid x 7days, then '200mg'$  daily after for this patient. Thanks  BA

## 2022-03-04 NOTE — Transfer of Care (Signed)
Immediate Anesthesia Transfer of Care Note  Patient: Monica Lindsey  Procedure(s) Performed: CARDIOVERSION  Patient Location: PACU  Anesthesia Type:General  Level of Consciousness: awake, alert , and oriented  Airway & Oxygen Therapy: Patient Spontanous Breathing  Post-op Assessment: Report given to RN and Post -op Vital signs reviewed and stable  Post vital signs: Reviewed and stable  Last Vitals:  Vitals Value Taken Time  BP 108/79 03/04/22 0739  Temp    Pulse 73 03/04/22 0741  Resp 22 03/04/22 0741  SpO2 98 % 03/04/22 0741  Vitals shown include unvalidated device data.  Last Pain:  Vitals:   03/04/22 0658  TempSrc: Oral  PainSc: 0-No pain         Complications: No notable events documented.

## 2022-03-04 NOTE — Progress Notes (Signed)
MD at bedside speaking with pt. & her daughter re: cardioversion attempt(s). Daughter verbalized understanding of conversation as pt. "Waking up" post procedure.

## 2022-03-13 ENCOUNTER — Encounter: Payer: Self-pay | Admitting: Nurse Practitioner

## 2022-03-13 ENCOUNTER — Ambulatory Visit (INDEPENDENT_AMBULATORY_CARE_PROVIDER_SITE_OTHER): Payer: Medicare PPO | Admitting: Nurse Practitioner

## 2022-03-13 ENCOUNTER — Ambulatory Visit: Payer: Medicare PPO | Admitting: Nurse Practitioner

## 2022-03-13 VITALS — BP 102/66 | HR 59 | Temp 98.2°F | Resp 16 | Ht 63.0 in | Wt 172.2 lb

## 2022-03-13 DIAGNOSIS — I4819 Other persistent atrial fibrillation: Secondary | ICD-10-CM | POA: Diagnosis not present

## 2022-03-13 DIAGNOSIS — R7989 Other specified abnormal findings of blood chemistry: Secondary | ICD-10-CM | POA: Insufficient documentation

## 2022-03-13 DIAGNOSIS — I10 Essential (primary) hypertension: Secondary | ICD-10-CM | POA: Diagnosis not present

## 2022-03-13 NOTE — Patient Instructions (Signed)
Nice to see you today I want to see you in 6 months for your physical, sooner if you need me

## 2022-03-13 NOTE — Progress Notes (Signed)
Established Patient Office Visit  Subjective   Patient ID: Monica Lindsey, female    DOB: July 17, 1945  Age: 77 y.o. MRN: [id number]  Chief Complaint  Patient presents with   Atrial Fibrillation   Hypertension      Afib: Patient was seen by Dr. Charlestine Night through cardiology and was admitted to the hospital for cardioversion for atrial fibrillation.  After cardioversion patient was placed on amiodarone.  We did continue metoprolol and Eliquis.  She has a follow-up with cardiology on 03/18/2022 and has a follow-up with EP on 03/25/2022.  She is taking medications inclusive of amiodarone as prescribed.  Does have occasional shortness of breath with exertion, palpitations.  Patient also complains of fatigue  HTN: Patient is currently maintained on amiodarone, amlodipine, hydrochlorothiazide, metoprolol.  She did see me on 01/14/2022 with concerns of lower extremity edema she had ran out of her hydrochlorothiazide and metoprolol for the past few days at that point and took an extra amlodipine in the absence.  Blood pressure has been doing well.  Patient has not had any more lower extremity edema since being back on medication regimen    Review of Systems  Constitutional:  Positive for malaise/fatigue. Negative for chills and fever.  Respiratory:  Negative for shortness of breath.   Cardiovascular:  Positive for palpitations. Negative for chest pain.  Neurological:  Negative for dizziness (off balance with quick movement) and headaches.  Psychiatric/Behavioral:  Negative for hallucinations and suicidal ideas.       Objective:     BP 102/66   Pulse (!) 59   Temp 98.2 F (36.8 C)   Resp 16   Ht 5' 3"$  (1.6 m)   Wt 172 lb 4 oz (78.1 kg)   SpO2 97%   BMI 30.51 kg/m  BP Readings from Last 3 Encounters:  03/13/22 102/66  03/04/22 96/67  02/27/22 118/84   Wt Readings from Last 3 Encounters:  03/13/22 172 lb 4 oz (78.1 kg)  03/04/22 172 lb (78 kg)  02/27/22 172 lb 3.2 oz (78.1 kg)       Physical Exam Vitals and nursing note reviewed.  Constitutional:      Appearance: Normal appearance.  Cardiovascular:     Rate and Rhythm: Normal rate. Rhythm irregular.     Heart sounds: Normal heart sounds.  Pulmonary:     Effort: Pulmonary effort is normal.     Breath sounds: Normal breath sounds.  Musculoskeletal:     Right lower leg: No edema.     Left lower leg: No edema.  Neurological:     Mental Status: She is alert.      No results found for any visits on 03/13/22.    The 10-year ASCVD risk score (Arnett DK, et al., 2019) is: 19.1%    Assessment & Plan:   Problem List Items Addressed This Visit       Cardiovascular and Mediastinum   Essential hypertension - Primary    Patient's blood pressure well-controlled.  She is not having any dizziness or lightheadedness.  Continue medication as prescribed.  Patient's edema has went down since getting back on medications as prescribed      Relevant Orders   Basic metabolic panel   Persistent atrial fibrillation ([place])    Patient currently anticoagulated with Eliquis 5 mg twice daily.  Does follow with cardiology now.  Did undergo a cardioversion that was unsuccessful patient was placed on amiodarone taking as prescribed.  She has an appointment coming up with  cardiology and EP follow-up with specialist as recommended.  Continue medication as prescribed      Relevant Orders   Basic metabolic panel     Other   Elevated serum creatinine    Noticed increased creatinine and decreased GFR from labs done by cardiology.  Looks like they wanted to repeat she is has not gotten done yet will repeat BMP in office.  Will forward to cardiology after completion.      Relevant Orders   Basic metabolic panel    Return in about 6 months (around 09/11/2022) for CPE and Labs.    Romilda Garret, NP

## 2022-03-13 NOTE — Assessment & Plan Note (Signed)
Patient's blood pressure well-controlled.  She is not having any dizziness or lightheadedness.  Continue medication as prescribed.  Patient's edema has went down since getting back on medications as prescribed

## 2022-03-13 NOTE — Assessment & Plan Note (Signed)
Patient currently anticoagulated with Eliquis 5 mg twice daily.  Does follow with cardiology now.  Did undergo a cardioversion that was unsuccessful patient was placed on amiodarone taking as prescribed.  She has an appointment coming up with cardiology and EP follow-up with specialist as recommended.  Continue medication as prescribed

## 2022-03-13 NOTE — Assessment & Plan Note (Signed)
Noticed increased creatinine and decreased GFR from labs done by cardiology.  Looks like they wanted to repeat she is has not gotten done yet will repeat BMP in office.  Will forward to cardiology after completion.

## 2022-03-14 LAB — BASIC METABOLIC PANEL
BUN/Creatinine Ratio: 13 (calc) (ref 6–22)
BUN: 18 mg/dL (ref 7–25)
CO2: 23 mmol/L (ref 20–32)
Calcium: 9.9 mg/dL (ref 8.6–10.4)
Chloride: 100 mmol/L (ref 98–110)
Creat: 1.37 mg/dL — ABNORMAL HIGH (ref 0.60–1.00)
Glucose, Bld: 109 mg/dL — ABNORMAL HIGH (ref 65–99)
Potassium: 3.7 mmol/L (ref 3.5–5.3)
Sodium: 138 mmol/L (ref 135–146)

## 2022-03-18 ENCOUNTER — Other Ambulatory Visit
Admission: RE | Admit: 2022-03-18 | Discharge: 2022-03-18 | Disposition: A | Discharge status: Home / Self Care | Payer: Medicare PPO | Source: Ambulatory Visit | Attending: Cardiology | Admitting: Cardiology

## 2022-03-18 ENCOUNTER — Ambulatory Visit: Payer: Medicare PPO | Attending: Cardiology | Admitting: Cardiology

## 2022-03-18 ENCOUNTER — Encounter: Payer: Self-pay | Admitting: Cardiology

## 2022-03-18 ENCOUNTER — Other Ambulatory Visit: Payer: Self-pay

## 2022-03-18 VITALS — BP 112/70 | HR 79 | Ht 63.0 in | Wt 170.5 lb

## 2022-03-18 DIAGNOSIS — I4819 Other persistent atrial fibrillation: Secondary | ICD-10-CM | POA: Diagnosis not present

## 2022-03-18 DIAGNOSIS — I10 Essential (primary) hypertension: Secondary | ICD-10-CM | POA: Diagnosis not present

## 2022-03-18 DIAGNOSIS — E7800 Pure hypercholesterolemia, unspecified: Secondary | ICD-10-CM | POA: Diagnosis not present

## 2022-03-18 LAB — CBC
HCT: 45.2 % (ref 36.0–46.0)
Hemoglobin: 14.9 g/dL (ref 12.0–15.0)
MCH: 29 pg (ref 26.0–34.0)
MCHC: 33 g/dL (ref 30.0–36.0)
MCV: 87.9 fL (ref 80.0–100.0)
Platelets: 297 10*3/uL (ref 150–400)
RBC: 5.14 MIL/uL — ABNORMAL HIGH (ref 3.87–5.11)
RDW: 13.4 % (ref 11.5–15.5)
WBC: 7 10*3/uL (ref 4.0–10.5)
nRBC: 0 % (ref 0.0–0.2)

## 2022-03-18 LAB — ADD ON BMP
BUN/Creatinine Ratio: 13 (calc) (ref 6–22)
BUN: 18 mg/dL (ref 7–25)
CO2: 23 mmol/L (ref 20–32)
Calcium: 9.9 mg/dL (ref 8.6–10.4)
Chloride: 100 mmol/L (ref 98–110)
Creat: 1.37 mg/dL — ABNORMAL HIGH (ref 0.60–1.00)
Glucose, Bld: 109 mg/dL — ABNORMAL HIGH (ref 65–99)
Potassium: 3.7 mmol/L (ref 3.5–5.3)
Sodium: 138 mmol/L (ref 135–146)
eGFR: 40 mL/min/{1.73_m2} — ABNORMAL LOW (ref >=60)

## 2022-03-18 LAB — BASIC METABOLIC PANEL
Anion gap: 9 (ref 5–15)
BUN: 22 mg/dL (ref 8–23)
CO2: 27 mmol/L (ref 22–32)
Calcium: 9.4 mg/dL (ref 8.9–10.3)
Chloride: 100 mmol/L (ref 98–111)
Creatinine, Ser: 1.35 mg/dL — ABNORMAL HIGH (ref 0.44–1.00)
GFR, Estimated: 40 mL/min — ABNORMAL LOW (ref >60)
Glucose, Bld: 105 mg/dL — ABNORMAL HIGH (ref 70–99)
Potassium: 3.6 mmol/L (ref 3.5–5.1)
Sodium: 136 mmol/L (ref 135–145)

## 2022-03-18 MED ORDER — AMIODARONE HCL 200 MG PO TABS: 200.0000 mg | ORAL_TABLET | Freq: Every day | ORAL | 3 refills | Status: DC

## 2022-03-18 NOTE — Patient Instructions (Signed)
Medication Instructions:   DECREASE Amiodarone - take one tablet (200 mg) by mouth daily.  STOP Pravastatin   *If you need a refill on your cardiac medications before your next appointment, please call your pharmacy*   Lab Work:  Your physician recommends you go to the medical mall for lab work.   If you have labs (blood work) drawn today and your tests are completely normal, you will receive your results only by: [place] (if you have MyChart) OR A paper copy in the mail If you have any lab test that is abnormal or we need to change your treatment, we will call you to review the results.   Testing/Procedures:  You are scheduled for a Cardioversion on ____February 28th, 2024____________ with Dr.__Agbor-Etang_________ Please arrive at the [place] of [facility] at ___6:30______ a.m. on the day of your procedure.  DIET INSTRUCTIONS:  Nothing to eat or drink after midnight except your medications with a safe sip of water.         Labs: _____Today_____________  Medications:  YOU MAY TAKE ALL of your remaining medications with a small amount of water.  Must have a responsible person to drive you home.  Bring a current list of your medications and current insurance cards.    If you have any questions after you get home, please call the office at [phone]    Follow-Up: At [facility], you and your health needs are our priority.  As part of our continuing mission to provide you with exceptional heart care, we have created designated Provider Care Teams.  These Care Teams include your primary Cardiologist (physician) and Advanced Practice Providers (APPs -  Physician Assistants and Nurse Practitioners) who all work together to provide you with the care you need, when you need it.  We recommend signing up for the patient portal called "MyChart".  Sign up information is provided on this After Visit Summary.  MyChart is used to connect with patients for Virtual Visits  (Telemedicine).  Patients are able to view lab/test results, encounter notes, upcoming appointments, etc.  Non-urgent messages can be sent to your provider as well.   To learn more about what you can do with MyChart, go to [email or web address].    Your next appointment:   6 week(s)  Provider:   You may see Kate Sable, MD or one of the following Advanced Practice Providers on your designated Care Team:   Murray Hodgkins, NP Christell Faith, PA-C Cadence Kathlen Mody, PA-C Gerrie Nordmann, NP

## 2022-03-18 NOTE — Progress Notes (Signed)
Cardiology Office Note:    Date:  03/18/2022   ID:  Monica Lindsey, DOB Jun 14, 1945, MRN [id number]  PCP:  Monica Pitcher, NP   [facility] Cardiologist:  Monica Sable, MD     Referring MD: Monica Pitcher, NP   No chief complaint on file.   History of Present Illness:    Monica Lindsey is a 77 y.o. female with a hx of persistent atrial fibrillation, hypertension, hyperlipidemia presents for follow-up.  Previously seen due to atrial fibrillation.  DC cardioversion was performed 02/2022 unsuccessfully.  Patient was started on amiodarone with taper.  Denies palpitations but endorses shortness of breath.  Also takes Lopressor.  Denies any bleeding issues with Eliquis.  Has appointment with EP next week, echo is scheduled in 2 days.   Past Medical History:  Diagnosis Date   Arthritis    Constipation    Frequent headaches    Hay fever    Heel spur    Hyperlipidemia    Hypertension     Past Surgical History:  Procedure Laterality Date   ABDOMINAL HYSTERECTOMY     parialt ovaries intact   BACK SURGERY  02/02/1981   CARDIOVERSION N/A 03/04/2022   Procedure: CARDIOVERSION;  Surgeon: Monica Sable, MD;  Location: [facility] ORS;  Service: Cardiovascular;  Laterality: N/A;   CARPOMETACARPAL ([place]) FUSION OF THUMB Right 04/07/2017   Procedure: CARPOMETACARPAL ([place]) FUSION OF THUMB;  Surgeon: Monica Leys, MD;  Location: [facility] ORS;  Service: Orthopedics;  Laterality: Right;   COLONOSCOPY     COLONOSCOPY WITH PROPOFOL N/A 09/10/2020   Procedure: COLONOSCOPY WITH PROPOFOL;  Surgeon: Monica Lame, MD;  Location: [facility] ENDOSCOPY;  Service: Endoscopy;  Laterality: N/A;   HEMORRHOID SURGERY     TOOTH EXTRACTION      Current Medications: Current Meds  Medication Sig   amLODipine (NORVASC) 5 MG tablet TAKE 1 TABLET DAILY.   apixaban (ELIQUIS) 5 MG TABS tablet Take 1 tablet (5 mg total) by mouth 2 (two) times daily.   benzonatate (TESSALON) 100 MG capsule Take 1  capsule (100 mg total) by mouth 3 (three) times daily as needed for cough.   cetirizine (ZYRTEC) 10 MG tablet Take 1 tablet (10 mg total) by mouth daily.   Cyanocobalamin (VITAMIN B-12 PO) Place 1 tablet under the tongue daily.   fluticasone (FLONASE) 50 MCG/ACT nasal spray Place 2 sprays into both nostrils daily.   guaiFENesin (MUCINEX) 600 MG 12 hr tablet Take 1 tablet (600 mg total) by mouth 2 (two) times daily.   hydrochlorothiazide (HYDRODIURIL) 25 MG tablet TAKE 1 TABLET DAILY.   metoprolol tartrate (LOPRESSOR) 25 MG tablet Take 1 tablet (25 mg total) by mouth 2 (two) times daily.   Multiple Vitamin (MULTIVITAMIN) tablet Take 1 tablet by mouth daily.   Omega-3 Fatty Acids (FISH OIL) 1000 MG CAPS Take 1,000 mg by mouth 2 (two) times daily.   Vitamin D, Cholecalciferol, 25 MCG (1000 UT) TABS Take by mouth.   [DISCONTINUED] amiodarone (PACERONE) 200 MG tablet Take 2 tablets (400 mg total) by mouth 2 (two) times daily for 7 days, THEN 1 tablet (200 mg total) 2 (two) times daily for 7 days, THEN 1 tablet (200 mg total) daily.   [DISCONTINUED] pravastatin (PRAVACHOL) 40 MG tablet Take 1 tablet (40 mg total) by mouth daily.     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   Widowed.      Still working 3 days per week.  Very close to her children and grand children.      Daughter, Monica Lindsey, is HPOA.  Has a living  will.   Would desire CPR, would not desire prolonged life support if futile.            Social Determinants of Health   Financial Resource Strain: Low Risk  (08/01/2021)   Overall Financial Resource Strain (CARDIA)    Difficulty of Paying Living Expenses: Not hard at all  Food Insecurity: No Food Insecurity (08/01/2021)   Hunger Vital Sign    Worried About Running Out of Food in the Last Year: Never true    Ran Out of Food in the Last Year: Never true  Transportation Needs: No Transportation Needs (08/01/2021)   PRAPARE - Hydrologist (Medical): No    Lack of Transportation (Non-Medical): No  Physical Activity: Insufficiently Active (08/01/2021)   Exercise Vital Sign    Days of Exercise per Week: 2 days    Minutes of Exercise per Session: 30 min  Stress: No Stress Concern Present (08/01/2021)   [place]    Feeling of Stress : Not at all  Social Connections: Moderately Integrated (08/01/2021)   Social Connection and Isolation Panel [NHANES]    Frequency of Communication with Friends and Family: More than three times a week    Frequency of Social Gatherings with Friends and Family: More than three times a week    Attends Religious Services: More than 4 times per year    Active Member of [employer] or Organizations: Yes    Attends Archivist Meetings: More than 4 times per year    Marital Status: Widowed     Family History: The patient's family history includes Alcohol abuse in her father; Arthritis in her father; Brain cancer in her sister; Breast cancer in her cousin; Breast cancer (age of onset: 93) in her paternal aunt; Heart disease in her father and mother; Hypertension in her father; Stroke in her father.  ROS:   Please see the history of present illness.     All other systems reviewed and are negative.  EKGs/Labs/Other Studies Reviewed:    The following studies were  reviewed today:   EKG:  EKG is  ordered today.  The ekg ordered today demonstrates atrial fibrillation, heart rate 79  Recent Labs: 11/26/2021: ALT 33; Pro B Natriuretic peptide (BNP) 189.0; TSH 0.82 03/13/2022: BUN 18; Creat 1.37; Potassium 3.7; Sodium 138 03/18/2022: Hemoglobin 14.9; Platelets 297  Recent Lipid Panel    Component Value Date/Time   CHOL 211 (H) 10/03/2021 0935   TRIG 121.0 10/03/2021 0935   HDL 59.60 10/03/2021 0935   CHOLHDL 4 10/03/2021 0935   VLDL 24.2 10/03/2021 0935   LDLCALC 127 (H) 10/03/2021  0935   LDLDIRECT 127.3 03/23/2013 1459     Risk Assessment/Calculations:             Physical Exam:    VS:  BP 112/70   Pulse 79   Ht 5' 3"$  (1.6 m)   Wt 170 lb 8 oz (77.3 kg)   BMI 30.20 kg/m     Wt Readings from Last 3 Encounters:  03/18/22 170 lb 8 oz (77.3 kg)  03/13/22 172 lb 4 oz (78.1 kg)  03/04/22 172 lb (78 kg)     GEN:  Well nourished, well developed in no acute distress HEENT: Normal NECK: No JVD; No carotid bruits CARDIAC: Irregular irregular RESPIRATORY:  Clear to auscultation without rales, wheezing or rhonchi  ABDOMEN: Soft, non-tender, non-distended MUSCULOSKELETAL:  No edema; No deformity  SKIN: Warm and dry NEUROLOGIC:  Alert and oriented x 3 PSYCHIATRIC:  Normal affect   ASSESSMENT:    1. Persistent atrial fibrillation ([place])   2. Primary hypertension   3. Pure hypercholesterolemia     PLAN:    In order of problems listed above:  Persistent atrial fibrillation, s/p failed DC cardioversion 1/24.  Occasional shortness of breath, palpitations.  Reduce amiodarone 200 mg daily, continue Lopressor 25 mg twice daily, Eliquis 5 mg twice daily.  Plan repeat cardioversion in 2 weeks.  Keep appointment with EP/A-fib clinic.  Obtain echo. Hypertension, BP controlled.  BP controlled.  Continue Lopressor, Norvasc, HCTZ.  Hyperlipidemia, calcium score 0.  Due to age, reasonable to stop Pravachol.  Follow-up in 6 weeks.      Shared  Decision Making/Informed Consent The risks (stroke, cardiac arrhythmias rarely resulting in the need for a temporary or permanent pacemaker, skin irritation or burns and complications associated with conscious sedation including aspiration, arrhythmia, respiratory failure and death), benefits (restoration of normal sinus rhythm) and alternatives of a direct current cardioversion were explained in detail to Ms. Dobies and she agrees to proceed.      Medication Adjustments/Labs and Tests Ordered: Current medicines are reviewed at length with the patient today.  Concerns regarding medicines are outlined above.  Orders Placed This Encounter  Procedures   CBC   Basic Metabolic Panel (BMET)   EKG 12-Lead   Meds ordered this encounter  Medications   DISCONTD: amiodarone (PACERONE) 200 MG tablet    Sig: Take 1 tablet (200 mg total) by mouth daily.    Dispense:  90 tablet    Refill:  3    Patient Instructions  Medication Instructions:   DECREASE Amiodarone - take one tablet (200 mg) by mouth daily.  STOP Pravastatin   *If you need a refill on your cardiac medications before your next appointment, please call your pharmacy*   Lab Work:  Your physician recommends you go to the medical mall for lab work.   If you have labs (blood work) drawn today and your tests are completely normal, you will receive your results only by: [place] (if you have MyChart) OR A paper copy in the mail If you have any lab test that is abnormal or we need to change your treatment, we will call you to review the results.   Testing/Procedures:  You are scheduled for a Cardioversion on ____February 28th, 2024____________ with Dr.__Agbor-Etang_________ Please arrive at the [place] [facility] at ___6:30______ a.m. on the day of your procedure.  DIET INSTRUCTIONS:  Nothing to eat or drink after midnight except your medications with a safe sip of water.  Labs:  _____Today_____________  Medications:  YOU MAY TAKE ALL of your remaining medications with a small amount of water.  Must have a responsible person to drive you home.  Bring a current list of your medications and current insurance cards.    If you have any questions after you get home, please call the office at [phone]    Follow-Up: At [facility], you and your health needs are our priority.  As part of our continuing mission to provide you with exceptional heart care, we have created designated Provider Care Teams.  These Care Teams include your primary Cardiologist (physician) and Advanced Practice Providers (APPs -  Physician Assistants and Nurse Practitioners) who all work together to provide you with the care you need, when you need it.  We recommend signing up for the patient portal called "MyChart".  Sign up information is provided on this After Visit Summary.  MyChart is used to connect with patients for Virtual Visits (Telemedicine).  Patients are able to view lab/test results, encounter notes, upcoming appointments, etc.  Non-urgent messages can be sent to your provider as well.   To learn more about what you can do with MyChart, go to [email or web address].    Your next appointment:   6 week(s)  Provider:   You may see Monica Sable, MD or one of the following Advanced Practice Providers on your designated Care Team:   Murray Hodgkins, NP Christell Faith, PA-C Cadence Kathlen Mody, PA-C Gerrie Nordmann, NP   Signed, Monica Sable, MD  03/18/2022 12:12 PM    [place]

## 2022-03-18 NOTE — H&P (View-Only) (Signed)
Cardiology Office Note:    Date:  03/18/2022   ID:  Monica Lindsey, DOB 12-31-1945, MRN [id number]  PCP:  Michela Pitcher, NP   [place] [facility] Cardiologist:  Kate Sable, MD     Referring MD: Michela Pitcher, NP   No chief complaint on file.   History of Present Illness:    Monica Lindsey is a 77 y.o. female with a hx of persistent atrial fibrillation, hypertension, hyperlipidemia presents for follow-up.  Previously seen due to atrial fibrillation.  DC cardioversion was performed 02/2022 unsuccessfully.  Patient was started on amiodarone with taper.  Denies palpitations but endorses shortness of breath.  Also takes Lopressor.  Denies any bleeding issues with Eliquis.  Has appointment with EP next week, echo is scheduled in 2 days.   Past Medical History:  Diagnosis Date   Arthritis    Constipation    Frequent headaches    Hay fever    Heel spur    Hyperlipidemia    Hypertension     Past Surgical History:  Procedure Laterality Date   ABDOMINAL HYSTERECTOMY     parialt ovaries intact   BACK SURGERY  02/02/1981   CARDIOVERSION N/A 03/04/2022   Procedure: CARDIOVERSION;  Surgeon: Kate Sable, MD;  Location: [facility] ORS;  Service: Cardiovascular;  Laterality: N/A;   CARPOMETACARPAL ([place]) FUSION OF THUMB Right 04/07/2017   Procedure: CARPOMETACARPAL ([place]) FUSION OF THUMB;  Surgeon: Earnestine Leys, MD;  Location: [facility] ORS;  Service: Orthopedics;  Laterality: Right;   COLONOSCOPY     COLONOSCOPY WITH PROPOFOL N/A 09/10/2020   Procedure: COLONOSCOPY WITH PROPOFOL;  Surgeon: [name], MD;  Location: [facility] ENDOSCOPY;  Service: Endoscopy;  Laterality: N/A;   HEMORRHOID SURGERY     TOOTH EXTRACTION      Current Medications: Current Meds  Medication Sig   amLODipine (NORVASC) 5 MG tablet TAKE 1 TABLET DAILY.   apixaban (ELIQUIS) 5 MG TABS tablet Take 1 tablet (5 mg total) by mouth 2 (two) times daily.   benzonatate (TESSALON) 100 MG capsule Take 1  capsule (100 mg total) by mouth 3 (three) times daily as needed for cough.   cetirizine (ZYRTEC) 10 MG tablet Take 1 tablet (10 mg total) by mouth daily.   Cyanocobalamin (VITAMIN B-12 PO) Place 1 tablet under the tongue daily.   fluticasone (FLONASE) 50 MCG/ACT nasal spray Place 2 sprays into both nostrils daily.   guaiFENesin (MUCINEX) 600 MG 12 hr tablet Take 1 tablet (600 mg total) by mouth 2 (two) times daily.   hydrochlorothiazide (HYDRODIURIL) 25 MG tablet TAKE 1 TABLET DAILY.   metoprolol tartrate (LOPRESSOR) 25 MG tablet Take 1 tablet (25 mg total) by mouth 2 (two) times daily.   Multiple Vitamin (MULTIVITAMIN) tablet Take 1 tablet by mouth daily.   Omega-3 Fatty Acids (FISH OIL) 1000 MG CAPS Take 1,000 mg by mouth 2 (two) times daily.   Vitamin D, Cholecalciferol, 25 MCG (1000 UT) TABS Take by mouth.   [DISCONTINUED] amiodarone (PACERONE) 200 MG tablet Take 2 tablets (400 mg total) by mouth 2 (two) times daily for 7 days, THEN 1 tablet (200 mg total) 2 (two) times daily for 7 days, THEN 1 tablet (200 mg total) daily.   [DISCONTINUED] pravastatin (PRAVACHOL) 40 MG tablet Take 1 tablet (40 mg total) by mouth daily.     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   Widowed.      Still working 3 days per week.  Very close to her children and grand children.      Daughter, [name], is HPOA.  Has a living  will.   Would desire CPR, would not desire prolonged life support if futile.            Social Determinants of Health   Financial Resource Strain: Low Risk  (08/01/2021)   Overall Financial Resource Strain (CARDIA)    Difficulty of Paying Living Expenses: Not hard at all  Food Insecurity: No Food Insecurity (08/01/2021)   Hunger Vital Sign    Worried About Running Out of Food in the Last Year: Never true    Ran Out of Food in the Last Year: Never true  Transportation Needs: No Transportation Needs (08/01/2021)   PRAPARE - Hydrologist (Medical): No    Lack of Transportation (Non-Medical): No  Physical Activity: Insufficiently Active (08/01/2021)   Exercise Vital Sign    Days of Exercise per Week: 2 days    Minutes of Exercise per Session: 30 min  Stress: No Stress Concern Present (08/01/2021)   [place]    Feeling of Stress : Not at all  Social Connections: Moderately Integrated (08/01/2021)   Social Connection and Isolation Panel [NHANES]    Frequency of Communication with Friends and Family: More than three times a week    Frequency of Social Gatherings with Friends and Family: More than three times a week    Attends Religious Services: More than 4 times per year    Active Member of [employer] or Organizations: Yes    Attends Archivist Meetings: More than 4 times per year    Marital Status: Widowed     Family History: The patient's family history includes Alcohol abuse in her father; Arthritis in her father; Brain cancer in her sister; Breast cancer in her cousin; Breast cancer (age of onset: 71) in her paternal aunt; Heart disease in her father and mother; Hypertension in her father; Stroke in her father.  ROS:   Please see the history of present illness.     All other systems reviewed and are negative.  EKGs/Labs/Other Studies Reviewed:    The following studies were  reviewed today:   EKG:  EKG is  ordered today.  The ekg ordered today demonstrates atrial fibrillation, heart rate 79  Recent Labs: 11/26/2021: ALT 33; Pro B Natriuretic peptide (BNP) 189.0; TSH 0.82 03/13/2022: BUN 18; Creat 1.37; Potassium 3.7; Sodium 138 03/18/2022: Hemoglobin 14.9; Platelets 297  Recent Lipid Panel    Component Value Date/Time   CHOL 211 (H) 10/03/2021 0935   TRIG 121.0 10/03/2021 0935   HDL 59.60 10/03/2021 0935   CHOLHDL 4 10/03/2021 0935   VLDL 24.2 10/03/2021 0935   LDLCALC 127 (H) 10/03/2021  0935   LDLDIRECT 127.3 03/23/2013 1459     Risk Assessment/Calculations:             Physical Exam:    VS:  BP 112/70   Pulse 79   Ht '5\' 3"'$  (1.6 m)   Wt 170 lb 8 oz (77.3 kg)   BMI 30.20 kg/m     Wt Readings from Last 3 Encounters:  03/18/22 170 lb 8 oz (77.3 kg)  03/13/22 172 lb 4 oz (78.1 kg)  03/04/22 172 lb (78 kg)     GEN:  Well nourished, well developed in no acute distress HEENT: Normal NECK: No JVD; No carotid bruits CARDIAC: Irregular irregular RESPIRATORY:  Clear to auscultation without rales, wheezing or rhonchi  ABDOMEN: Soft, non-tender, non-distended MUSCULOSKELETAL:  No edema; No deformity  SKIN: Warm and dry NEUROLOGIC:  Alert and oriented x 3 PSYCHIATRIC:  Normal affect   ASSESSMENT:    1. Persistent atrial fibrillation ([place])   2. Primary hypertension   3. Pure hypercholesterolemia     PLAN:    In order of problems listed above:  Persistent atrial fibrillation, s/p failed DC cardioversion 1/24.  Occasional shortness of breath, palpitations.  Reduce amiodarone 200 mg daily, continue Lopressor 25 mg twice daily, Eliquis 5 mg twice daily.  Plan repeat cardioversion in 2 weeks.  Keep appointment with EP/A-fib clinic.  Obtain echo. Hypertension, BP controlled.  BP controlled.  Continue Lopressor, Norvasc, HCTZ.  Hyperlipidemia, calcium score 0.  Due to age, reasonable to stop Pravachol.  Follow-up in 6 weeks.      Shared  Decision Making/Informed Consent The risks (stroke, cardiac arrhythmias rarely resulting in the need for a temporary or permanent pacemaker, skin irritation or burns and complications associated with conscious sedation including aspiration, arrhythmia, respiratory failure and death), benefits (restoration of normal sinus rhythm) and alternatives of a direct current cardioversion were explained in detail to Ms. Cozby and she agrees to proceed.      Medication Adjustments/Labs and Tests Ordered: Current medicines are reviewed at length with the patient today.  Concerns regarding medicines are outlined above.  Orders Placed This Encounter  Procedures   CBC   Basic Metabolic Panel (BMET)   EKG 12-Lead   Meds ordered this encounter  Medications   DISCONTD: amiodarone (PACERONE) 200 MG tablet    Sig: Take 1 tablet (200 mg total) by mouth daily.    Dispense:  90 tablet    Refill:  3    Patient Instructions  Medication Instructions:   DECREASE Amiodarone - take one tablet (200 mg) by mouth daily.  STOP Pravastatin   *If you need a refill on your cardiac medications before your next appointment, please call your pharmacy*   Lab Work:  Your physician recommends you go to the medical mall for lab work.   If you have labs (blood work) drawn today and your tests are completely normal, you will receive your results only by: [place] (if you have MyChart) OR A paper copy in the mail If you have any lab test that is abnormal or we need to change your treatment, we will call you to review the results.   Testing/Procedures:  You are scheduled for a Cardioversion on ____February 28th, 2024____________ with Dr.__Agbor-Etang_________ Please arrive at the [place] [facility] at ___6:30______ a.m. on the day of your procedure.  DIET INSTRUCTIONS:  Nothing to eat or drink after midnight except your medications with a safe sip of water.  Labs:  _____Today_____________  Medications:  YOU MAY TAKE ALL of your remaining medications with a small amount of water.  Must have a responsible person to drive you home.  Bring a current list of your medications and current insurance cards.    If you have any questions after you get home, please call the office at [phone]    Follow-Up: At [facility], you and your health needs are our priority.  As part of our continuing mission to provide you with exceptional heart care, we have created designated Provider Care Teams.  These Care Teams include your primary Cardiologist (physician) and Advanced Practice Providers (APPs -  Physician Assistants and Nurse Practitioners) who all work together to provide you with the care you need, when you need it.  We recommend signing up for the patient portal called "MyChart".  Sign up information is provided on this After Visit Summary.  MyChart is used to connect with patients for Virtual Visits (Telemedicine).  Patients are able to view lab/test results, encounter notes, upcoming appointments, etc.  Non-urgent messages can be sent to your provider as well.   To learn more about what you can do with MyChart, go to [email or web address].    Your next appointment:   6 week(s)  Provider:   You may see Kate Sable, MD or one of the following Advanced Practice Providers on your designated Care Team:   Murray Hodgkins, NP Christell Faith, PA-C Cadence Kathlen Mody, PA-C Gerrie Nordmann, NP   Signed, Kate Sable, MD  03/18/2022 12:12 PM    [place]

## 2022-03-18 NOTE — H&P (View-Only) (Signed)
Cardiology Office Note:    Date:  03/18/2022   ID:  Monica Lindsey, DOB 1945-12-21, MRN [id number]  PCP:  Michela Pitcher, NP   [place] Prov[facility] Cardiologist:  Kate Sable, MD     Referring MD: Michela Pitcher, NP   No chief complaint on file.   History of Present Illness:    Monica Lindsey is a 77 y.o. female with a hx of persistent atrial fibrillation, hypertension, hyperlipidemia presents for follow-up.  Previously seen due to atrial fibrillation.  DC cardioversion was performed 02/2022 unsuccessfully.  Patient was started on amiodarone with taper.  Denies palpitations but endorses shortness of breath.  Also takes Lopressor.  Denies any bleeding issues with Eliquis.  Has appointment with EP next week, echo is scheduled in 2 days.   Past Medical History:  Diagnosis Date   Arthritis    Constipation    Frequent headaches    Hay fever    Heel spur    Hyperlipidemia    Hypertension     Past Surgical History:  Procedure Laterality Date   ABDOMINAL HYSTERECTOMY     parialt ovaries intact   BACK SURGERY  02/02/1981   CARDIOVERSION N/A 03/04/2022   Procedure: CARDIOVERSION;  Surgeon: Kate Sable, MD;  Location: [facility] ORS;  Service: Cardiovascular;  Laterality: N/A;   CARPOMETACARPAL ([place]) FUSION OF THUMB Right 04/07/2017   Procedure: CARPOMETACARPAL ([place]) FUSION OF THUMB;  Surgeon: Earnestine Leys, MD;  Location: [facility] ORS;  Service: Orthopedics;  Laterality: Right;   COLONOSCOPY     COLONOSCOPY WITH PROPOFOL N/A 09/10/2020   Procedure: COLONOSCOPY WITH PROPOFOL;  Surgeon: [name], MD;  [facility] ENDOSCOPY;  Service: Endoscopy;  Laterality: N/A;   HEMORRHOID SURGERY     TOOTH EXTRACTION      Current Medications: Current Meds  Medication Sig   amLODipine (NORVASC) 5 MG tablet TAKE 1 TABLET DAILY.   apixaban (ELIQUIS) 5 MG TABS tablet Take 1 tablet (5 mg total) by mouth 2 (two) times daily.   benzonatate (TESSALON) 100 MG capsule Take 1  capsule (100 mg total) by mouth 3 (three) times daily as needed for cough.   cetirizine (ZYRTEC) 10 MG tablet Take 1 tablet (10 mg total) by mouth daily.   Cyanocobalamin (VITAMIN B-12 PO) Place 1 tablet under the tongue daily.   fluticasone (FLONASE) 50 MCG/ACT nasal spray Place 2 sprays into both nostrils daily.   guaiFENesin (MUCINEX) 600 MG 12 hr tablet Take 1 tablet (600 mg total) by mouth 2 (two) times daily.   hydrochlorothiazide (HYDRODIURIL) 25 MG tablet TAKE 1 TABLET DAILY.   metoprolol tartrate (LOPRESSOR) 25 MG tablet Take 1 tablet (25 mg total) by mouth 2 (two) times daily.   Multiple Vitamin (MULTIVITAMIN) tablet Take 1 tablet by mouth daily.   Omega-3 Fatty Acids (FISH OIL) 1000 MG CAPS Take 1,000 mg by mouth 2 (two) times daily.   Vitamin D, Cholecalciferol, 25 MCG (1000 UT) TABS Take by mouth.   [DISCONTINUED] amiodarone (PACERONE) 200 MG tablet Take 2 tablets (400 mg total) by mouth 2 (two) times daily for 7 days, THEN 1 tablet (200 mg total) 2 (two) times daily for 7 days, THEN 1 tablet (200 mg total) daily.   [DISCONTINUED] pravastatin (PRAVACHOL) 40 MG tablet Take 1 tablet (40 mg total) by mouth daily.     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   Widowed.      Still working 3 days per week.  Very close to her children and grand children.      Daughter, Monica Lindsey, is HPOA.  Has a living  will.   Would desire CPR, would not desire prolonged life support if futile.            Social Determinants of Health   Financial Resource Strain: Low Risk  (08/01/2021)   Overall Financial Resource Strain (CARDIA)    Difficulty of Paying Living Expenses: Not hard at all  Food Insecurity: No Food Insecurity (08/01/2021)   Hunger Vital Sign    Worried About Running Out of Food in the Last Year: Never true    Ran Out of Food in the Last Year: Never true  Transportation Needs: No Transportation Needs (08/01/2021)   PRAPARE - Hydrologist (Medical): No    Lack of Transportation (Non-Medical): No  Physical Activity: Insufficiently Active (08/01/2021)   Exercise Vital Sign    Days of Exercise per Week: 2 days    Minutes of Exercise per Session: 30 min  Stress: No Stress Concern Present (08/01/2021)   [place]    Feeling of Stress : Not at all  Social Connections: Moderately Integrated (08/01/2021)   Social Connection and Isolation Panel [NHANES]    Frequency of Communication with Friends and Family: More than three times a week    Frequency of Social Gatherings with Friends and Family: More than three times a week    Attends Religious Services: More than 4 times per year    Active Member of [employer] or Organizations: Yes    Attends Archivist Meetings: More than 4 times per year    Marital Status: Widowed     Family History: The patient's family history includes Alcohol abuse in her father; Arthritis in her father; Brain cancer in her sister; Breast cancer in her cousin; Breast cancer (age of onset: 90) in her paternal aunt; Heart disease in her father and mother; Hypertension in her father; Stroke in her father.  ROS:   Please see the history of present illness.     All other systems reviewed and are negative.  EKGs/Labs/Other Studies Reviewed:    The following studies were  reviewed today:   EKG:  EKG is  ordered today.  The ekg ordered today demonstrates atrial fibrillation, heart rate 79  Recent Labs: 11/26/2021: ALT 33; Pro B Natriuretic peptide (BNP) 189.0; TSH 0.82 03/13/2022: BUN 18; Creat 1.37; Potassium 3.7; Sodium 138 03/18/2022: Hemoglobin 14.9; Platelets 297  Recent Lipid Panel    Component Value Date/Time   CHOL 211 (H) 10/03/2021 0935   TRIG 121.0 10/03/2021 0935   HDL 59.60 10/03/2021 0935   CHOLHDL 4 10/03/2021 0935   VLDL 24.2 10/03/2021 0935   LDLCALC 127 (H) 10/03/2021  0935   LDLDIRECT 127.3 03/23/2013 1459     Risk Assessment/Calculations:             Physical Exam:    VS:  BP 112/70   Pulse 79   Ht '5\' 3"'$  (1.6 m)   Wt 170 lb 8 oz (77.3 kg)   BMI 30.20 kg/m     Wt Readings from Last 3 Encounters:  03/18/22 170 lb 8 oz (77.3 kg)  03/13/22 172 lb 4 oz (78.1 kg)  03/04/22 172 lb (78 kg)     GEN:  Well nourished, well developed in no acute distress HEENT: Normal NECK: No JVD; No carotid bruits CARDIAC: Irregular irregular RESPIRATORY:  Clear to auscultation without rales, wheezing or rhonchi  ABDOMEN: Soft, non-tender, non-distended MUSCULOSKELETAL:  No edema; No deformity  SKIN: Warm and dry NEUROLOGIC:  Alert and oriented x 3 PSYCHIATRIC:  Normal affect   ASSESSMENT:    1. Persistent atrial fibrillation ([place])   2. Primary hypertension   3. Pure hypercholesterolemia     PLAN:    In order of problems listed above:  Persistent atrial fibrillation, s/p failed DC cardioversion 1/24.  Occasional shortness of breath, palpitations.  Reduce amiodarone 200 mg daily, continue Lopressor 25 mg twice daily, Eliquis 5 mg twice daily.  Plan repeat cardioversion in 2 weeks.  Keep appointment with EP/A-fib clinic.  Obtain echo. Hypertension, BP controlled.  BP controlled.  Continue Lopressor, Norvasc, HCTZ.  Hyperlipidemia, calcium score 0.  Due to age, reasonable to stop Pravachol.  Follow-up in 6 weeks.      Shared  Decision Making/Informed Consent The risks (stroke, cardiac arrhythmias rarely resulting in the need for a temporary or permanent pacemaker, skin irritation or burns and complications associated with conscious sedation including aspiration, arrhythmia, respiratory failure and death), benefits (restoration of normal sinus rhythm) and alternatives of a direct current cardioversion were explained in detail to Ms. [place] and she agrees to proceed.      Medication Adjustments/Labs and Tests Ordered: Current medicines are reviewed at length with the patient today.  Concerns regarding medicines are outlined above.  Orders Placed This Encounter  Procedures   CBC   Basic Metabolic Panel (BMET)   EKG 12-Lead   Meds ordered this encounter  Medications   DISCONTD: amiodarone (PACERONE) 200 MG tablet    Sig: Take 1 tablet (200 mg total) by mouth daily.    Dispense:  90 tablet    Refill:  3    Patient Instructions  Medication Instructions:   DECREASE Amiodarone - take one tablet (200 mg) by mouth daily.  STOP Pravastatin   *If you need a refill on your cardiac medications before your next appointment, please call your pharmacy*   Lab Work:  Your physician recommends you go to the medical mall for lab work.   If you have labs (blood work) drawn today and your tests are completely normal, you will receive your results only by: [place] (if you have MyChart) OR A paper copy in the mail If you have any lab test that is abnormal or we need to change your treatment, we will call you to review the results.   Testing/Procedures:  You are scheduled for a Cardioversion on ____February 28th, 2024____________ with Dr.__Agbor-Etang_________ Please arrive at the [place] [facility] at ___6:30______ a.m. on the day of your procedure.  DIET INSTRUCTIONS:  Nothing to eat or drink after midnight except your medications with a safe sip of water.  Labs:  _____Today_____________  Medications:  YOU MAY TAKE ALL of your remaining medications with a small amount of water.  Must have a responsible person to drive you home.  Bring a current list of your medications and current insurance cards.    If you have any questions after you get home, please call the office at [phone]    Follow-Up: At [facility], you and your health needs are our priority.  As part of our continuing mission to provide you with exceptional heart care, we have created designated Provider Care Teams.  These Care Teams include your primary Cardiologist (physician) and Advanced Practice Providers (APPs -  Physician Assistants and Nurse Practitioners) who all work together to provide you with the care you need, when you need it.  We recommend signing up for the patient portal called "MyChart".  Sign up information is provided on this After Visit Summary.  MyChart is used to connect with patients for Virtual Visits (Telemedicine).  Patients are able to view lab/test results, encounter notes, upcoming appointments, etc.  Non-urgent messages can be sent to your provider as well.   To learn more about what you can do with MyChart, go to [email or web address].    Your next appointment:   6 week(s)  Provider:   You may see Kate Sable, MD or one of the following Advanced Practice Providers on your designated Care Team:   Murray Hodgkins, NP Christell Faith, PA-C Cadence Kathlen Mody, PA-C Gerrie Nordmann, NP   Signed, Kate Sable, MD  03/18/2022 12:12 PM    [place]

## 2022-03-20 ENCOUNTER — Ambulatory Visit: Payer: Medicare PPO | Attending: Cardiology

## 2022-03-20 DIAGNOSIS — I4819 Other persistent atrial fibrillation: Secondary | ICD-10-CM | POA: Diagnosis not present

## 2022-03-20 LAB — ECHOCARDIOGRAM COMPLETE
AR max vel: 3.31 cm2
AV Area VTI: 3.04 cm2
AV Area mean vel: 2.95 cm2
AV Mean grad: 1.3 mmHg
AV Peak grad: 2.2 mmHg
Ao pk vel: 0.74 m/s
S' Lateral: 3.7 cm

## 2022-03-20 MED ORDER — PERFLUTREN LIPID MICROSPHERE
1.0000 mL | INTRAVENOUS | Status: AC | PRN
  Administered 2022-03-20: 2 mL via INTRAVENOUS

## 2022-03-22 NOTE — Progress Notes (Unsigned)
Electrophysiology Office Note:    Date:  03/25/2022   ID:  JEWELIANA Lindsey, DOB August 10, 1945, MRN [id number]  PCP:  Monica Pitcher, NP  [facility] Cardiologist:  Monica Sable, MD  [facility] Electrophysiologist:  Monica Epley, MD   Referring MD: Monica Sable, MD   Chief Complaint: persistent AF  History of Present Illness:    Monica Lindsey is a 77 y.o. female who presents for an evaluation of persistent AF at the request of persistent AF, HTN, HLD.   She was seen by Dr Monica Lindsey 03/18/2022. Had a cardioversion 02/2022 that was unsuccessful. She was started on amiodarone. Takes eliquis.  When she was first diagnosed with atrial fibrillation she had diastolic heart failure symptoms but those have improved.  She is with her daughter today in clinic.     Past Medical History:  Diagnosis Date   Arthritis    Constipation    Frequent headaches    Hay fever    Heel spur    Hyperlipidemia    Hypertension     Past Surgical History:  Procedure Laterality Date   ABDOMINAL HYSTERECTOMY     parialt ovaries intact   BACK SURGERY  02/02/1981   CARDIOVERSION N/A 03/04/2022   Procedure: CARDIOVERSION;  Surgeon: Monica Sable, MD;  Location: [facility] ORS;  Service: Cardiovascular;  Laterality: N/A;   CARPOMETACARPAL ([place]) FUSION OF THUMB Right 04/07/2017   Procedure: CARPOMETACARPAL ([place]) FUSION OF THUMB;  Surgeon: Monica Leys, MD;  Location: [facility] ORS;  Service: Orthopedics;  Laterality: Right;   COLONOSCOPY     COLONOSCOPY WITH PROPOFOL N/A 09/10/2020   Procedure: COLONOSCOPY WITH PROPOFOL;  Surgeon: Monica Lame, MD;  Location: [facility] ENDOSCOPY;  Service: Endoscopy;  Laterality: N/A;   HEMORRHOID SURGERY     TOOTH EXTRACTION      Current Medications: Current Meds  Medication Sig   amiodarone (PACERONE) 200 MG tablet Take 1 tablet (200 mg total) by mouth daily.   amLODipine (NORVASC) 5 MG tablet TAKE 1 TABLET DAILY.   apixaban (ELIQUIS) 5 MG TABS tablet  Take 1 tablet (5 mg total) by mouth 2 (two) times daily.   benzonatate (TESSALON) 100 MG capsule Take 1 capsule (100 mg total) by mouth 3 (three) times daily as needed for cough.   cetirizine (ZYRTEC) 10 MG tablet Take 1 tablet (10 mg total) by mouth daily.   Cyanocobalamin (VITAMIN B-12 PO) Place 1 tablet under the tongue daily.   fluticasone (FLONASE) 50 MCG/ACT nasal spray Place 2 sprays into both nostrils daily.   guaiFENesin (MUCINEX) 600 MG 12 hr tablet Take 1 tablet (600 mg total) by mouth 2 (two) times daily.   hydrochlorothiazide (HYDRODIURIL) 25 MG tablet TAKE 1 TABLET DAILY.   metoprolol tartrate (LOPRESSOR) 25 MG tablet Take 1 tablet (25 mg total) by mouth 2 (two) times daily.   Multiple Vitamin (MULTIVITAMIN) tablet Take 1 tablet by mouth daily.   Omega-3 Fatty Acids (FISH OIL) 1000 MG CAPS Take 1,000 mg by mouth 2 (two) times daily.   Vitamin D, Cholecalciferol, 25 MCG (1000 UT) TABS Take by mouth.     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   Widowed.      Still working 3 days per week.  Very close to her children and grand children.      Daughter, Monica Lindsey, is HPOA.  Has a living will.   Would desire CPR, would not desire prolonged life support if futile.            Social Determinants of Health   Financial Resource Strain: Low Risk  (08/01/2021)   Overall Financial Resource Strain  (CARDIA)    Difficulty of Paying Living Expenses: Not hard at all  Food Insecurity: No Food Insecurity (08/01/2021)   Hunger Vital Sign    Worried About Running Out of Food in the Last Year: Never true    Ran Out of Food in the Last Year: Never true  Transportation Needs: No Transportation Needs (08/01/2021)   PRAPARE - Hydrologist (Medical): No    Lack of Transportation (Non-Medical): No  Physical Activity: Insufficiently Active (08/01/2021)   Exercise Vital Sign    Days of Exercise per Week: 2 days    Minutes of Exercise per Session: 30 min  Stress: No Stress Concern Present (08/01/2021)   [place]    Feeling of Stress : Not at all  Social Connections: Moderately Integrated (08/01/2021)   Social Connection and Isolation Panel [NHANES]    Frequency of Communication with Friends and Family: More than three times a week    Frequency of Social Gatherings with Friends and Family: More than three times a week    Attends Religious Services: More than 4 times per year    Active Member of [employer] or Organizations: Yes    Attends Archivist Meetings: More than 4 times per year    Marital Status: Widowed     Family History: The patient's family history includes Alcohol abuse in her father; Arthritis in her father; Brain cancer in her sister; Breast cancer in her cousin; Breast cancer (age of onset: 31) in her paternal aunt; Heart disease in her father and mother; Hypertension in her father; Stroke in her father.  ROS:   Please see the history of present illness.    All other systems reviewed and are negative.  EKGs/Labs/Other Studies Reviewed:    The following studies were reviewed today:   11/26/2021 ECG shows AF 03/04/2022 ECG shows AF  04/01/2017 ECG shows sinus rhythm      Recent Labs: 11/26/2021: ALT 33; Pro B Natriuretic peptide (BNP) 189.0; TSH 0.82 03/18/2022: BUN 22;  Creatinine, Ser 1.35; Hemoglobin 14.9; Platelets 297; Potassium 3.6; Sodium 136  Recent Lipid Panel    Component Value Date/Time   CHOL 211 (H) 10/03/2021 0935   TRIG 121.0 10/03/2021 0935   HDL 59.60 10/03/2021 0935   CHOLHDL 4 10/03/2021 0935   VLDL 24.2 10/03/2021 0935   LDLCALC 127 (H) 10/03/2021 0935   LDLDIRECT 127.3 03/23/2013 1459    Physical Exam:    VS:  BP 136/72   Pulse 67   Ht 5' 3"$  (1.6 m)   Wt 173 lb 3.2 oz (78.6 kg)   SpO2  98%   BMI 30.68 kg/m     Wt Readings from Last 3 Encounters:  03/25/22 173 lb 3.2 oz (78.6 kg)  03/18/22 170 lb 8 oz (77.3 kg)  03/13/22 172 lb 4 oz (78.1 kg)     GEN:  Well nourished, well developed in no acute distress. Elderly CARDIAC: irregularly irregular, no murmurs, rubs, gallops RESPIRATORY:  Clear to auscultation without rales, wheezing or rhonchi        ASSESSMENT:    1. Persistent atrial fibrillation ([place])   2. Primary hypertension    PLAN:    In order of problems listed above:   #Persistent AF Failed DCCV 03/04/2022. ?if any sinus rhythm immediately post shock Repeat planned for 04/01/2022.   Continue eliquis Continue amiodarone 277m PO daily   #Hypertension At goal today.  Recommend checking blood pressures 1-2 times per week at home and recording the values.  Recommend bringing these recordings to the primary care physician.  I will see her back after her cardioversion to review the results.  If cardioversion is unsuccessful, we will plan to stop the amiodarone and deem her atrial fibrillation to be permanent.  If successful, we will plan to continue amiodarone and discuss monitoring for off target effects.    Medication Adjustments/Labs and Tests Ordered: Current medicines are reviewed at length with the patient today.  Concerns regarding medicines are outlined above.  No orders of the defined types were placed in this encounter.  No orders of the defined types were placed in this  encounter.    Signed, CHilton Cork LQuentin Ore MD, [facility]2/21/2024 10:06 AM    Electrophysiology [facility]

## 2022-03-25 ENCOUNTER — Encounter: Payer: Self-pay | Admitting: Cardiology

## 2022-03-25 ENCOUNTER — Ambulatory Visit: Payer: Medicare PPO | Attending: Cardiology | Admitting: Cardiology

## 2022-03-25 VITALS — BP 136/72 | HR 67 | Ht 63.0 in | Wt 173.2 lb

## 2022-03-25 DIAGNOSIS — I4819 Other persistent atrial fibrillation: Secondary | ICD-10-CM | POA: Diagnosis not present

## 2022-03-25 DIAGNOSIS — I10 Essential (primary) hypertension: Secondary | ICD-10-CM

## 2022-03-25 NOTE — Patient Instructions (Signed)
Medication Instructions:  Your physician recommends that you continue on your current medications as directed. Please refer to the Current Medication list given to you today.  *If you need a refill on your cardiac medications before your next appointment, please call your pharmacy*  Follow-Up: At [facility], you and your health needs are our priority.  As part of our continuing mission to provide you with exceptional heart care, we have created designated Provider Care Teams.  These Care Teams include your primary Cardiologist (physician) and Advanced Practice Providers (APPs -  Physician Assistants and Nurse Practitioners) who all work together to provide you with the care you need, when you need it.  Your next appointment:   3 weeks  Provider:   Lars Mage, MD

## 2022-03-26 ENCOUNTER — Ambulatory Visit: Payer: Medicare PPO | Admitting: Nurse Practitioner

## 2022-03-30 ENCOUNTER — Telehealth: Payer: Self-pay | Admitting: Nurse Practitioner

## 2022-03-30 DIAGNOSIS — H6993 Unspecified Eustachian tube disorder, bilateral: Secondary | ICD-10-CM

## 2022-03-30 NOTE — Telephone Encounter (Signed)
Left message to return call to our office.  

## 2022-03-30 NOTE — Telephone Encounter (Signed)
Patient called in and would like a referral to be placed for an ENT. She stated that she is still having issues with her ear. Please advise. Thank you!

## 2022-03-30 NOTE — Addendum Note (Signed)
Addended by: Michela Pitcher on: 03/30/2022 01:33 PM   Modules accepted: Orders

## 2022-03-30 NOTE — Telephone Encounter (Signed)
Referral placed for ENT in [place]

## 2022-03-31 MED ORDER — SODIUM CHLORIDE 0.9 % IV SOLN: INTRAVENOUS | Status: DC

## 2022-04-01 ENCOUNTER — Ambulatory Visit
Admission: RE | Admit: 2022-04-01 | Discharge: 2022-04-01 | Disposition: A | Discharge status: Home / Self Care | Payer: Medicare PPO | Source: Ambulatory Visit | Attending: Cardiology | Admitting: Cardiology

## 2022-04-01 ENCOUNTER — Encounter: Payer: Self-pay | Admitting: *Deleted

## 2022-04-01 ENCOUNTER — Telehealth: Payer: Self-pay

## 2022-04-01 DIAGNOSIS — I4819 Other persistent atrial fibrillation: Secondary | ICD-10-CM

## 2022-04-01 NOTE — Telephone Encounter (Signed)
Patient returning call. Transferred to [place], [place].

## 2022-04-01 NOTE — Telephone Encounter (Signed)
Patient returned call.  Rescheduled Cardioversion for 04/08/2022.  Reviewed instructions with patient.  Mailed letter with instructions and put instructions on MyChart for patient.

## 2022-04-01 NOTE — Telephone Encounter (Signed)
Received incoming Secure chat from MD needing to reschedule cardioversion.   Called patient. No answer.left detailed message on VM - okay per DPR.   Awaiting call back.

## 2022-04-01 NOTE — Interval H&P Note (Signed)
History and Physical Interval Note:  04/01/2022 7:49 AM  Patient presented for DCCV for afib. She ate at about 430am. Procedure was cancelled with plans to reschedule.  I did not physically meet patient.  Office will contact patient to reschedule procedure.   Monica Lindsey

## 2022-04-01 NOTE — Telephone Encounter (Signed)
Pt returned Kelly's missed call, told pt Cable's response. No questions/concerns. Call back # [phone]

## 2022-04-01 NOTE — Anesthesia Preprocedure Evaluation (Signed)
Anesthesia Evaluation  Patient identified by MRN, date of birth, ID band Patient awake    Reviewed: Allergy & Precautions, NPO status , Patient's Chart, lab work & pertinent test results  History of Anesthesia Complications (+) history of anesthetic complications  Airway Mallampati: III  TM Distance: >3 FB Neck ROM: full    Dental  (+) Chipped   Pulmonary neg pulmonary ROS, neg shortness of breath   Pulmonary exam normal        Cardiovascular Exercise Tolerance: Good hypertension, (-) Past MI + dysrhythmias Atrial Fibrillation  Rhythm:irregular  ECHO 03/2022:  1. Left ventricular ejection fraction, by estimation, is 35 to 40%. Left  ventricular ejection fraction by PLAX is 37 %. The left ventricle has  moderately decreased function. The left ventricle demonstrates global  hypokinesis. Left ventricular diastolic  parameters are indeterminate.   2. Right ventricular systolic function is normal. The right ventricular  size is normal. There is normal pulmonary artery systolic pressure. The  estimated right ventricular systolic pressure is [id number] mmHg.   3. A small pericardial effusion is present. 1 cm off the LV free wall.  There is no evidence of cardiac tamponade.   4. The mitral valve is normal in structure. No evidence of mitral valve  regurgitation. No evidence of mitral stenosis.   5. Tricuspid valve regurgitation is moderate.   6. The aortic valve has an indeterminant number of cusps. Aortic valve  regurgitation is not visualized. Aortic valve sclerosis is present, with  no evidence of aortic valve stenosis.   7. The inferior vena cava is normal in size with greater than 50%  respiratory variability, suggesting right atrial pressure of 3 mmHg.     Neuro/Psych  Headaches  Neuromuscular disease  negative psych ROS   GI/Hepatic negative GI ROS, Neg liver ROS,neg GERD  ,,  Endo/Other  negative endocrine ROS     Renal/GU negative Renal ROS  negative genitourinary   Musculoskeletal   Abdominal   Peds  Hematology negative hematology ROS (+)   Anesthesia Other Findings Past Medical History: No date: Arthritis No date: Constipation No date: Frequent headaches No date: Hay fever No date: Heel spur No date: Hyperlipidemia No date: Hypertension  Past Surgical History: No date: ABDOMINAL HYSTERECTOMY     Comment:  parialt ovaries intact 02/02/1981: BACK SURGERY 04/07/2017: CARPOMETACARPAL ([facility]) FUSION OF THUMB; Right     Comment:  Procedure: CARPOMETACARPAL [facility]) FUSION OF THUMB;                Surgeon: Earnestine Leys, MD;  Location: [facility] ORS;                Service: Orthopedics;  Laterality: Right; No date: COLONOSCOPY 09/10/2020: COLONOSCOPY WITH PROPOFOL; N/A     Comment:  Procedure: COLONOSCOPY WITH PROPOFOL;  Surgeon: [name], MD;  Location: [facility] ENDOSCOPY;  Service:               Endoscopy;  Laterality: N/A; No date: HEMORRHOID SURGERY No date: TOOTH EXTRACTION  BMI    Body Mass Index: 30.47 kg/m      Reproductive/Obstetrics negative OB ROS                              Anesthesia Physical Anesthesia Plan  ASA: 3  Anesthesia Plan: General   Post-op Pain Management:    Induction: Intravenous  PONV  Risk Score and Plan: Propofol infusion and TIVA  Airway Management Planned: Natural Airway and Nasal Cannula  Additional Equipment:   Intra-op Plan:   Post-operative Plan:   Informed Consent:      Dental Advisory Given  Plan Discussed with: Anesthesiologist, CRNA and Surgeon  Anesthesia Plan Comments:         Anesthesia Quick Evaluation

## 2022-04-07 ENCOUNTER — Other Ambulatory Visit: Payer: Self-pay | Admitting: Cardiology

## 2022-04-08 ENCOUNTER — Ambulatory Visit: Payer: Medicare PPO | Admitting: Anesthesiology

## 2022-04-08 ENCOUNTER — Telehealth: Payer: Self-pay | Admitting: Cardiology

## 2022-04-08 ENCOUNTER — Encounter
Admission: RE | Disposition: A | Discharge status: Home / Self Care | Payer: Self-pay | Source: Ambulatory Visit | Attending: Cardiology

## 2022-04-08 ENCOUNTER — Ambulatory Visit
Admission: RE | Admit: 2022-04-08 | Discharge: 2022-04-08 | Disposition: A | Discharge status: Home / Self Care | Payer: Medicare PPO | Source: Ambulatory Visit | Attending: Cardiology | Admitting: Cardiology

## 2022-04-08 DIAGNOSIS — I10 Essential (primary) hypertension: Secondary | ICD-10-CM | POA: Insufficient documentation

## 2022-04-08 DIAGNOSIS — Z7901 Long term (current) use of anticoagulants: Secondary | ICD-10-CM | POA: Insufficient documentation

## 2022-04-08 DIAGNOSIS — E7800 Pure hypercholesterolemia, unspecified: Secondary | ICD-10-CM | POA: Diagnosis not present

## 2022-04-08 DIAGNOSIS — E669 Obesity, unspecified: Secondary | ICD-10-CM | POA: Diagnosis not present

## 2022-04-08 DIAGNOSIS — I4819 Other persistent atrial fibrillation: Secondary | ICD-10-CM | POA: Diagnosis not present

## 2022-04-08 DIAGNOSIS — E785 Hyperlipidemia, unspecified: Secondary | ICD-10-CM | POA: Insufficient documentation

## 2022-04-08 DIAGNOSIS — I4891 Unspecified atrial fibrillation: Secondary | ICD-10-CM

## 2022-04-08 DIAGNOSIS — Z6830 Body mass index (BMI) 30.0-30.9, adult: Secondary | ICD-10-CM | POA: Diagnosis not present

## 2022-04-08 HISTORY — PX: CARDIOVERSION: SHX1299

## 2022-04-08 SURGERY — CARDIOVERSION
Anesthesia: Monitor Anesthesia Care

## 2022-04-08 MED ORDER — PROPOFOL 10 MG/ML IV BOLUS
INTRAVENOUS | Status: DC | PRN
  Administered 2022-04-08: 60 mg via INTRAVENOUS

## 2022-04-08 MED ORDER — PROPOFOL 10 MG/ML IV BOLUS
INTRAVENOUS | Status: AC
  Filled 2022-04-08: qty 20

## 2022-04-08 MED ORDER — PROPOFOL 1000 MG/100ML IV EMUL
INTRAVENOUS | Status: AC
  Filled 2022-04-08: qty 100

## 2022-04-08 MED ORDER — SODIUM CHLORIDE 0.9 % IV SOLN: INTRAVENOUS | Status: DC | PRN

## 2022-04-08 MED ORDER — METOPROLOL TARTRATE 25 MG PO TABS: 12.5000 mg | ORAL_TABLET | Freq: Two times a day (BID) | ORAL | 0 refills | Status: DC

## 2022-04-08 NOTE — Procedures (Addendum)
Cardioversion procedure note For atrial fibrillation.  Procedure Details:  Consent: Risks of procedure as well as the alternatives and risks of each were explained to the (patient/caregiver).  Consent for procedure obtained.  Time Out: Verified patient identification, verified procedure, site/side was marked, verified correct patient position, special equipment/implants available, medications/allergies/relevent history reviewed, required imaging and test results available.  Performed  Patient placed on cardiac monitor, pulse oximetry, supplemental oxygen as necessary.   Sedation given: propofol IV, per anesthesia team Pacer pads placed anterior and posterior chest.   Cardioverted 1 time(s).   Cardioverted at  [place]. Synchronized biphasic Converted to NSR   Evaluation: Findings: Post procedure EKG shows: NSR/ sinus bradycardia Complications: None Patient did tolerate procedure well.  Recommendation: Cont amiodarone '200mg'$  daily, cont eliquis Reduce metoprolol to 12.'5mg'$  bid Keep f/u appointment with afib clinic and myself.  Time Spent Directly with the Patient:  45 minutes   Kate Sable, M.D.

## 2022-04-08 NOTE — Anesthesia Postprocedure Evaluation (Signed)
Anesthesia Post Note  Patient: Monica Lindsey  Procedure(s) Performed: CARDIOVERSION  Patient location during evaluation: PACU Anesthesia Type: MAC Level of consciousness: awake and alert Pain management: pain level controlled Vital Signs Assessment: post-procedure vital signs reviewed and stable Respiratory status: spontaneous breathing, nonlabored ventilation, respiratory function stable and patient connected to nasal cannula oxygen Cardiovascular status: blood pressure returned to baseline and stable Postop Assessment: no apparent nausea or vomiting Anesthetic complications: no   No notable events documented.   Last Vitals:  Vitals:   04/08/22 0815 04/08/22 0830  BP: 106/65 (!) 112/55  Pulse: (!) 48 (!) 55  Resp: 19 16  Temp:    SpO2: 97% 96%    Last Pain:  Vitals:   04/08/22 0830  TempSrc:   PainSc: 0-No pain                 Molli Barrows

## 2022-04-08 NOTE — Interval H&P Note (Signed)
History and Physical Interval Note:  04/08/2022 7:42 AM  Monica Lindsey  has presented today for surgery, with the diagnosis of Afib.  The various methods of treatment have been discussed with the patient and family. After consideration of risks, benefits and other options for treatment, the patient has consented to  Procedure(s): CARDIOVERSION (N/A) as a surgical intervention.  The patient's history has been reviewed, patient examined, no change in status, stable for surgery.  I have reviewed the patient's chart and labs.  Questions were answered to the patient's satisfaction.     Aaron Edelman [place]

## 2022-04-08 NOTE — Telephone Encounter (Signed)
LMOV to reschedule

## 2022-04-08 NOTE — Transfer of Care (Signed)
Immediate Anesthesia Transfer of Care Note  Patient: Monica Lindsey  Procedure(s) Performed: CARDIOVERSION  Patient Location: Cath Lab  Anesthesia Type:MAC  Level of Consciousness: drowsy  Airway & Oxygen Therapy: Patient Spontanous Breathing and Patient connected to nasal cannula oxygen  Post-op Assessment: Report given to RN and Post -op Vital signs reviewed and stable  Post vital signs: Reviewed and stable  Last Vitals:  Vitals Value Taken Time  BP 105/63 04/08/22 0752  Temp 35.9 0752  Pulse 46 04/08/22 0752  Resp 18 04/08/22 0752  SpO2 96 % 04/08/22 0752    Last Pain:  Vitals:   04/08/22 0719  TempSrc: Oral         Complications: No notable events documented.

## 2022-04-08 NOTE — Telephone Encounter (Signed)
-----   Message from [place]an [place], [place] sent at 04/08/2022  8:45 AM EST ----- Hey! Patient has her Cardioversion this AM. Can you please reach out to patient and push for follow up appt with Dr. Garen Lah out about 2-3 months please!  Thanks,  [place]

## 2022-04-09 ENCOUNTER — Encounter: Payer: Self-pay | Admitting: Cardiology

## 2022-04-13 NOTE — Telephone Encounter (Signed)
LMOV to reschedule  

## 2022-04-16 NOTE — Telephone Encounter (Signed)
LMOV to reschedule  

## 2022-04-19 NOTE — Progress Notes (Unsigned)
  Electrophysiology Office Follow up Visit Note:    Date:  04/22/2022   ID:  Monica Lindsey, DOB 07/24/1945, MRN [id number]  PCP:  Michela Pitcher, NP  [facility] Cardiologist:  Kate Sable, MD  [facility] Electrophysiologist:  Vickie Epley, MD    Interval History:    Monica Lindsey is a 77 y.o. female who presents for a follow up visit.   Last seen 03/25/2022.  At the last appt, I continued her amiodarone and arranged another DCCV.  DCCV 04/08/2022 was successful.  She continues to take amoidarone and metoprolol. She has been doing well.  She feels much better now that she is in normal rhythm.    Past medical, surgical, social and family history were reviewed.  ROS:   Please see the history of present illness.    All other systems reviewed and are negative.  EKGs/Labs/Other Studies Reviewed:    The following studies were reviewed today:   EKG:  The ekg ordered today demonstrates sinus rhythm. HR 58.   Physical Exam:    VS:  BP (!) 164/72   Pulse (!) 58   Ht 5\' 3"  (1.6 m)   Wt 174 lb 6.4 oz (79.1 kg)   SpO2 97%   BMI 30.89 kg/m     Wt Readings from Last 3 Encounters:  04/22/22 174 lb 6.4 oz (79.1 kg)  04/08/22 172 lb 6.4 oz (78.2 kg)  03/25/22 173 lb 3.2 oz (78.6 kg)     GEN:  Well nourished, well developed in no acute distress CARDIAC: RRR, no murmurs, rubs, gallops RESPIRATORY:  Clear to auscultation without rales, wheezing or rhonchi       ASSESSMENT:    1. Persistent atrial fibrillation ([place])   2. Primary hypertension   3. Encounter for long-term (current) use of high-risk medication    PLAN:    In order of problems listed above:   #Persistent AF #High risk med monitoring-amiodarone On amiodarone 200mg  PO daily Continue eliquis Needs repeat CMP, TSH and FT4 today.  #Hypertension Above goal today.  She checks it at home and it has been controlled there.  Recommend checking blood pressures 1-2 times per week at home and  recording the values.  Recommend bringing these recordings to the primary care physician.   Follow up 6 months with APP.     Signed, Lars Mage, MD, [facility], [facility] 04/22/2022 9:31 AM    Electrophysiology [facility]

## 2022-04-22 ENCOUNTER — Ambulatory Visit: Payer: Medicare PPO | Attending: Cardiology | Admitting: Cardiology

## 2022-04-22 ENCOUNTER — Encounter: Payer: Self-pay | Admitting: Cardiology

## 2022-04-22 ENCOUNTER — Other Ambulatory Visit
Admission: RE | Admit: 2022-04-22 | Discharge: 2022-04-22 | Disposition: A | Discharge status: Home / Self Care | Payer: Medicare PPO | Attending: Cardiology | Admitting: Cardiology

## 2022-04-22 VITALS — BP 164/72 | HR 58 | Ht 63.0 in | Wt 174.4 lb

## 2022-04-22 DIAGNOSIS — I4819 Other persistent atrial fibrillation: Secondary | ICD-10-CM | POA: Diagnosis not present

## 2022-04-22 DIAGNOSIS — I10 Essential (primary) hypertension: Secondary | ICD-10-CM

## 2022-04-22 DIAGNOSIS — Z79899 Other long term (current) drug therapy: Secondary | ICD-10-CM

## 2022-04-22 LAB — COMPREHENSIVE METABOLIC PANEL
ALT: 30 U/L (ref 0–44)
AST: 28 U/L (ref 15–41)
Albumin: 4.1 g/dL (ref 3.5–5.0)
Alkaline Phosphatase: 41 U/L (ref 38–126)
Anion gap: 5 (ref 5–15)
BUN: 30 mg/dL — ABNORMAL HIGH (ref 8–23)
CO2: 26 mmol/L (ref 22–32)
Calcium: 9.5 mg/dL (ref 8.9–10.3)
Chloride: 109 mmol/L (ref 98–111)
Creatinine, Ser: 1.03 mg/dL — ABNORMAL HIGH (ref 0.44–1.00)
GFR, Estimated: 56 mL/min — ABNORMAL LOW (ref >60)
Glucose, Bld: 100 mg/dL — ABNORMAL HIGH (ref 70–99)
Potassium: 4 mmol/L (ref 3.5–5.1)
Sodium: 140 mmol/L (ref 135–145)
Total Bilirubin: 1 mg/dL (ref 0.3–1.2)
Total Protein: 7 g/dL (ref 6.5–8.1)

## 2022-04-22 LAB — TSH: TSH: 0.013 u[IU]/mL — ABNORMAL LOW (ref 0.350–4.500)

## 2022-04-22 LAB — T4, FREE: Free T4: 1.93 ng/dL — ABNORMAL HIGH (ref 0.61–1.12)

## 2022-04-22 NOTE — Patient Instructions (Addendum)
Medication Instructions:  Your physician recommends that you continue on your current medications as directed. Please refer to the Current Medication list given to you today.  *If you need a refill on your cardiac medications before your next appointment, please call your pharmacy*  Lab Work: CMET, TSH, T4 - You will get your lab work at [facility]) hospital.  Your lab work will be done at the [place] next to [employer].  These are walk in labs- you will not need an appointment and you do not need to be fasting.    If you have labs (blood work) drawn today and your tests are completely normal, you will receive your results only by: [place] (if you have MyChart) OR A paper copy in the mail If you have any lab test that is abnormal or we need to change your treatment, we will call you to review the results.  Follow-Up: At [facility], you and your health needs are our priority.  As part of our continuing mission to provide you with exceptional heart care, we have created designated Provider Care Teams.  These Care Teams include your primary Cardiologist (physician) and Advanced Practice Providers (APPs -  Physician Assistants and Nurse Practitioners) who all work together to provide you with the care you need, when you need it.  Follow up with Dr. Garen Lah in 3 months  Your next appointment:   6 month(s)  Provider:   You will see one of the following Advanced Practice Providers on your designated Care Team:   Tommye Standard, [place]" [place], [place], NP

## 2022-04-24 ENCOUNTER — Ambulatory Visit
Admission: RE | Admit: 2022-04-24 | Discharge: 2022-04-24 | Disposition: A | Discharge status: Home / Self Care | Payer: Medicare PPO | Source: Ambulatory Visit | Attending: Nurse Practitioner | Admitting: Nurse Practitioner

## 2022-04-24 DIAGNOSIS — Z1231 Encounter for screening mammogram for malignant neoplasm of breast: Secondary | ICD-10-CM | POA: Diagnosis not present

## 2022-04-26 ENCOUNTER — Encounter: Payer: Self-pay | Admitting: Nurse Practitioner

## 2022-04-27 ENCOUNTER — Encounter: Payer: Self-pay | Admitting: Nurse Practitioner

## 2022-04-27 ENCOUNTER — Telehealth: Payer: Self-pay | Admitting: Nurse Practitioner

## 2022-04-27 ENCOUNTER — Other Ambulatory Visit: Payer: Self-pay | Admitting: Nurse Practitioner

## 2022-04-27 ENCOUNTER — Other Ambulatory Visit: Payer: Self-pay

## 2022-04-27 DIAGNOSIS — I4891 Unspecified atrial fibrillation: Secondary | ICD-10-CM

## 2022-04-27 DIAGNOSIS — I10 Essential (primary) hypertension: Secondary | ICD-10-CM

## 2022-04-27 MED ORDER — AMLODIPINE BESYLATE 5 MG PO TABS: ORAL_TABLET | ORAL | 3 refills | Status: DC

## 2022-04-27 MED ORDER — APIXABAN 5 MG PO TABS: 5.0000 mg | ORAL_TABLET | Freq: Two times a day (BID) | ORAL | 5 refills | Status: DC

## 2022-04-27 NOTE — Telephone Encounter (Signed)
hydrochlorothiazide (HYDRODIURIL) 25 MG tablet  Last visit 03/13/2022 Next Per Matt come back in 6 months (09/11/2022)

## 2022-04-27 NOTE — Telephone Encounter (Signed)
amLODipine (NORVASC) 5 MG tablet  Last visit 04/02/2022 Next visit Per Catalina [place] come back in 6 months

## 2022-04-27 NOTE — Telephone Encounter (Signed)
Prescription Request  04/27/2022  LOV: 03/13/2022  What is the name of the medication or equipment? apixaban (ELIQUIS) 5 MG TABS tablet   Have you contacted your pharmacy to request a refill? Yes   Which pharmacy would you like this sent to?   [facility] [id number], [place] AT [facility] OF [address] & [address] [place] [address] Phone: [phone] Fax: [phone]    Patient notified that their request is being sent to the clinical staff for review and that they should receive a response within 2 business days.   Please advise at [place]

## 2022-04-27 NOTE — Telephone Encounter (Signed)
RX sent to provider

## 2022-04-27 NOTE — Telephone Encounter (Signed)
Rx sent to pharmacy   

## 2022-04-27 NOTE — Telephone Encounter (Signed)
Prescription Request  04/27/2022  LOV: 03/13/2022  What is the name of the medication or equipment? hydrochlorothiazide (HYDRODIURIL) 25 MG tablet   Have you contacted your pharmacy to request a refill? No   Which pharmacy would you like this sent to?   [facility] [id number], [address] AT [facility] OF [address] & [place] [address] Phone: [phone] Fax: [phone]    Patient notified that their request is being sent to the clinical staff for review and that they should receive a response within 2 business days.   Please advise at Mobile [phone] (mobile)

## 2022-04-28 ENCOUNTER — Emergency Department: Payer: Medicare PPO

## 2022-04-28 ENCOUNTER — Telehealth: Payer: Self-pay | Admitting: Nurse Practitioner

## 2022-04-28 ENCOUNTER — Encounter: Payer: Self-pay | Admitting: Radiology

## 2022-04-28 ENCOUNTER — Telehealth: Payer: Self-pay | Admitting: Cardiology

## 2022-04-28 ENCOUNTER — Emergency Department
Admission: EM | Admit: 2022-04-28 | Discharge: 2022-04-28 | Disposition: A | Discharge status: Home / Self Care | Payer: Medicare PPO | Attending: Student in an Organized Health Care Education/Training Program | Admitting: Student in an Organized Health Care Education/Training Program

## 2022-04-28 DIAGNOSIS — M546 Pain in thoracic spine: Secondary | ICD-10-CM

## 2022-04-28 DIAGNOSIS — I10 Essential (primary) hypertension: Secondary | ICD-10-CM | POA: Diagnosis not present

## 2022-04-28 DIAGNOSIS — R5383 Other fatigue: Secondary | ICD-10-CM | POA: Diagnosis not present

## 2022-04-28 DIAGNOSIS — M545 Low back pain, unspecified: Secondary | ICD-10-CM | POA: Diagnosis not present

## 2022-04-28 DIAGNOSIS — R1084 Generalized abdominal pain: Secondary | ICD-10-CM | POA: Diagnosis not present

## 2022-04-28 DIAGNOSIS — E860 Dehydration: Secondary | ICD-10-CM

## 2022-04-28 DIAGNOSIS — I700 Atherosclerosis of aorta: Secondary | ICD-10-CM | POA: Diagnosis not present

## 2022-04-28 DIAGNOSIS — R079 Chest pain, unspecified: Secondary | ICD-10-CM | POA: Diagnosis not present

## 2022-04-28 DIAGNOSIS — R531 Weakness: Secondary | ICD-10-CM

## 2022-04-28 LAB — COMPREHENSIVE METABOLIC PANEL
ALT: 32 U/L (ref 0–44)
AST: 30 U/L (ref 15–41)
Albumin: 3.7 g/dL (ref 3.5–5.0)
Alkaline Phosphatase: 44 U/L (ref 38–126)
Anion gap: 13 (ref 5–15)
BUN: 32 mg/dL — ABNORMAL HIGH (ref 8–23)
CO2: 28 mmol/L (ref 22–32)
Calcium: 10 mg/dL (ref 8.9–10.3)
Chloride: 97 mmol/L — ABNORMAL LOW (ref 98–111)
Creatinine, Ser: 1.69 mg/dL — ABNORMAL HIGH (ref 0.44–1.00)
GFR, Estimated: 31 mL/min — ABNORMAL LOW (ref >60)
Glucose, Bld: 141 mg/dL — ABNORMAL HIGH (ref 70–99)
Potassium: 4.2 mmol/L (ref 3.5–5.1)
Sodium: 138 mmol/L (ref 135–145)
Total Bilirubin: 0.8 mg/dL (ref 0.3–1.2)
Total Protein: 6.3 g/dL — ABNORMAL LOW (ref 6.5–8.1)

## 2022-04-28 LAB — LIPASE, BLOOD: Lipase: 30 U/L (ref 11–51)

## 2022-04-28 LAB — CBC
HCT: 41.9 % (ref 36.0–46.0)
Hemoglobin: 14.1 g/dL (ref 12.0–15.0)
MCH: 30.3 pg (ref 26.0–34.0)
MCHC: 33.7 g/dL (ref 30.0–36.0)
MCV: 90.1 fL (ref 80.0–100.0)
Platelets: 210 10*3/uL (ref 150–400)
RBC: 4.65 MIL/uL (ref 3.87–5.11)
RDW: 12.8 % (ref 11.5–15.5)
WBC: 5.6 10*3/uL (ref 4.0–10.5)
nRBC: 0 % (ref 0.0–0.2)

## 2022-04-28 LAB — TROPONIN I (HIGH SENSITIVITY): Troponin I (High Sensitivity): 15 ng/L (ref <18)

## 2022-04-28 MED ORDER — IOHEXOL 350 MG/ML SOLN
75.0000 mL | Freq: Once | INTRAVENOUS | Status: AC | PRN
  Administered 2022-04-28: 75 mL via INTRAVENOUS

## 2022-04-28 MED ORDER — HYDROCHLOROTHIAZIDE 25 MG PO TABS: ORAL_TABLET | ORAL | 1 refills | Status: DC

## 2022-04-28 MED ORDER — SODIUM CHLORIDE 0.9 % IV BOLUS
500.0000 mL | Freq: Once | INTRAVENOUS | Status: AC
  Administered 2022-04-28: 500 mL via INTRAVENOUS

## 2022-04-28 NOTE — Telephone Encounter (Signed)
[facility] - Client TELEPHONE ADVICE RECORD [facility] Patient Name: Monica Lindsey Gender: Female DOB: October 03, 1945 Age: 77 Y 5 M 5 D Return Phone Number: [phone] (Primary) Address: City/ State/ Zip: [place]  [address] Client [facility] - Client Client Site [facility] - Day Provider Romilda Garret- NP Contact Type Call Who Is Calling Patient / Member / Family / Caregiver Call Type Triage / Clinical Relationship To Patient Self Return Phone Number [phone] (Primary) Chief Complaint Weakness, Generalized Reason for Call Symptomatic / Request for [place] states she has a fib and she has been felling very fatigue and weak. She also states she is not feeling like how she normally feels. Translation No Nurse Assessment Nurse: D'Heur Lucia Gaskins, RN, Adrienne Date/Time (Eastern Time): 04/28/2022 11:29:06 AM Confirm and document reason for call. If symptomatic, describe symptoms. ---Caller states she has a fib and she has been feeling very fatigued and weak. She also states she is not feeling like how she normally feels ever since she had cardioversion. She had a cardioversion 13 days ago. She had it done twice in one day a month ago. Her knees buckled last night. Today she started to go to a class but felt too weak to do so. Does the patient have any new or worsening symptoms? ---Yes Will a triage be completed? ---Yes Related visit to physician within the last 2 weeks? ---Yes Does the PT have any chronic conditions? (i.e. diabetes, asthma, this includes High risk factors for pregnancy, etc.) ---Yes List chronic conditions. ---A-fib, HTN Is this a behavioral health or substance abuse call? ---No Guidelines Guideline Title Affirmed Question Affirmed Notes Nurse Date/Time (Eastern Time) Chest Pain Patient sounds very sick or weak to the triager D'Heur  Lucia Gaskins, RN, Vincente Liberty 04/28/2022 11:34:06 AM PLEASE NOTE: All timestamps contained within this report are represented as [place] Standard Time. CONFIDENTIALTY NOTICE: This fax transmission is intended only for the addressee. It contains information that is legally privileged, confidential or otherwise protected from use or disclosure. If you are not the intended recipient, you are strictly prohibited from reviewing, disclosing, copying using or disseminating any of this information or taking any action in reliance on or regarding this information. If you have received this fax in error, please notify us immediately by telephone so that we can arrange for its return to [place]. Phone: [phone], Toll-Free: [phone], Fax: [phone] Page: 2 of 2 Call Id: [id number] [place]. Time Eilene Ghazi Time) Disposition Final User 04/28/2022 11:15:51 AM Attempt made - message left D'Heur Macky Lower 04/28/2022 11:49:32 AM Go to ED Now (or PCP triage) Yes D'Heur Lucia Gaskins, RN, Adrienne Final Disposition 04/28/2022 11:49:32 AM Go to ED Now (or PCP triage) Yes D'Heur Lucia Gaskins, RN, Ebbie Latus Disagree/Comply Disagree Caller Understands Yes PreDisposition Call Doctor Care Advice Given Per Guideline GO TO ED NOW (OR PCP TRIAGE): * IF NO PCP (PRIMARY CARE PROVIDER) SECOND-LEVEL TRIAGE: You need to be seen within the next hour. Go to the [facility] at _____________ [place] as soon as you can. BRING MEDICINES: * Please bring a list of your current medicines when you go to see the doctor. ANOTHER ADULT SHOULD DRIVE: * It is better and safer if another adult drives instead of you. CALL EMS IF: * Severe difficulty breathing occurs * Passes out or becomes too weak to stand * You become worse CARE ADVICE given per Chest Pain (Adult) guideline. Comments User: Vincente Liberty, D'Heur Waverly,  RN Date/Time Eilene Ghazi Time): 04/28/2022 11:43:00 AM Caller states she felt pressure all the way around her chest this morning ; it  lasted a few minutes. She feels it "just a little bit"now. User: Vincente Liberty, D'Heur Lucia Gaskins, RN Date/Time Eilene Ghazi Time): 04/28/2022 11:48:16 AM Caller now states she had "twinges" in her chest last night. Current BP is 137/91, pulse is 65. Referrals GO TO FACILITY REFUSE

## 2022-04-28 NOTE — Telephone Encounter (Signed)
Given patients history I STRONGLY encourage the patient to be evaluated. Even if that means the ED

## 2022-04-28 NOTE — ED Provider Notes (Signed)
[facility] Provider Note    Event Date/Time   First MD Initiated Contact with Patient 04/28/22 1557     (approximate)   History   Weakness   HPI  Monica Lindsey is a 77 y.o. female with history of A-fib with cardioversion, hypertension, chronic back pain and as listed in EMR presents to the emergency department for evaluation of generalized weakness and fatigue for the past few weeks.  She was cardioverted on April 6 and was at advised to continue taking her amiodarone until last night.  Daughter read a chart note from Dr. Quentin Ore who advised her to stop taking it.  Today, she was standing in her kitchen and had a sudden sharp stabbing pain in her upper stomach that radiated into her back.  She felt so weak that she had to hold onto the counter to prevent herself from falling..   Since that time she has had a squeezing sensation in her back and abdomen.  sharp stabbing pain has not returned.    Physical Exam   Triage Vital Signs: ED Triage Vitals  Enc Vitals Group     BP 152/64     Pulse 58     Resp 18     Temp 97.7     Temp src      SpO2 99     Weight      Height      Head Circumference      Peak Flow      Pain Score      Pain Loc      Pain Edu?      Excl. in [place]?     Most recent vital signs: Vitals:   04/28/22 1558 04/28/22 1625  BP: (!) 147/72 (!) 152/64  Pulse: 62 (!) 58  Resp: 19 18  Temp: 97.7 F (36.5 C) 97.7 F (36.5 C)  SpO2: 96% 99%    General: Awake, no distress.  CV:  Good peripheral perfusion.  Resp:  Normal effort.  Abd:  No distention.  Other:     ED Results / Procedures / Treatments   Labs (all labs ordered are listed, but only abnormal results are displayed) Labs Reviewed  COMPREHENSIVE METABOLIC PANEL - Abnormal; Notable for the following components:      Result Value   Chloride 97 (*)    Glucose, Bld 141 (*)    BUN 32 (*)    Creatinine, Ser 1.69 (*)    Total Protein 6.3 (*)    GFR, Estimated 31 (*)     All other components within normal limits  CBC  LIPASE, BLOOD  TROPONIN I (HIGH SENSITIVITY)     EKG  Normal sinus rhythm with a rate of 61   RADIOLOGY  CTA chest, abdomen, and pelvis for dissection is negative for acute findings.  PROCEDURES:  Critical Care performed: No  Procedures   MEDICATIONS ORDERED IN ED:  Medications  iohexol (OMNIPAQUE) 350 MG/ML injection 75 mL (75 mLs Intravenous Contrast Given 04/28/22 1722)  sodium chloride 0.9 % bolus 500 mL (0 mLs Intravenous Stopped 04/28/22 1856)     IMPRESSION / MDM / ASSESSMENT AND PLAN / ED COURSE   I have reviewed the triage note.  Differential diagnosis includes, but is not limited to, A-fib, cardiac event, dissection, electrolyte imbalance, medication side effect.  Patient's presentation is most consistent with acute presentation with potential threat to life or bodily function.  The patient is on the cardiac monitor to evaluate  for evidence of arrhythmia and/or significant heart rate changes.  77 year old female presenting to the emergency department for treatment and evaluation after experiencing a sharp stabbing pain in her upper abdomen that radiated into her back then has had a persistent squeezing sensation that encases her back and abdomen.  She also experienced a period where she was more weak than usual.  She did not sustain a fall or injury.  See HPI for further details.  Plan will be to get labs and a CTA of the chest, abdomen, and pelvis to rule out dissection.  She is currently in sinus rhythm on the bedside monitor.  Lab studies are overall reassuring.  She has a normal CBC.  Normal troponin.  Normal lipase.  She does have a BUN of 32 and a creatinine of 1.69.  On review of lab trends, her GFR is lower than her baseline.  She has no known history of congestive heart failure and therefore she will be given a 500 mL bolus of normal saline since she will require contrast with the CT.  CTA of the chest,  abdomen, and pelvis is negative for acute concerns.  Case reviewed with ED attending, Dr. Corky Downs, who also evaluated the patient.  Admission was considered, however she is not currently in A-fib and troponin is normal. SHe has no chest pain or shortness of breath. The stabbing pain she felt earlier has not recurred.  CT scan is reassuring.  Patient is comfortable being discharged home with plan to follow-up with her cardiologist. She will not restart the amiodarone as advised by electrophysiologist . This may be the cause of her fatigue and weakness.  ER return precautions given.      FINAL CLINICAL IMPRESSION(S) / ED DIAGNOSES   Final diagnoses:  Fatigue, unspecified type  Weakness  Generalized abdominal pain  Acute thoracic back pain, unspecified back pain laterality  Other fatigue  Dehydration     Rx / DC Orders   ED Discharge Orders          Ordered    Ambulatory referral to Cardiology       Comments: If you have not heard from the Cardiology office within the next 72 hours please call [phone].   04/28/22 1842             Note:  This document was prepared using Dragon voice recognition software and may include unintentional dictation errors.   Victorino Dike, FNP 04/28/22 1900    Lavonia Drafts, MD 04/28/22 Einar Crow

## 2022-04-28 NOTE — Telephone Encounter (Addendum)
Pts daughter and pt notified as instructed and pt is going to an ED; pts daughter said they would discuss which ED they would go to.spoke of [facility] ED or [facility] ED at [facility]. And then said not sure where they would go but will go to ED. [name] called cardiology but have not heard back from nurse yet. I did ask pt how she was feeling right now and pt said that she feels really tired but no pain at this time.Sending note to Romilda Garret NP and [place] pool.

## 2022-04-28 NOTE — Telephone Encounter (Signed)
Patient called in and stated that she has A Fib. She stated that she is weak and very fatigued. She stated that she isn't feeling like herself. Sent over to access nurse.

## 2022-04-28 NOTE — ED Triage Notes (Signed)
Pt sts that she has been weak for the last month and a half.  Within the last two weeks, pt sts that she has had two cardioversions due to afib.

## 2022-04-28 NOTE — Telephone Encounter (Signed)
Called patient.   Spoke with patients daughter. She reports that they spoke with patients primary care provider and they recommended her to go to the ED so that is where they currently are.

## 2022-04-28 NOTE — Telephone Encounter (Signed)
I spoke with Minette Brine (DPR signed) and she said that pt did not want to go to ED and Minette Brine does not know who pts cardiologist is and pt wanted appt at [facility] available appts at [facility] today. pt's legs felt like they were going to buckle last night and pt did not feel like going anywhere this morning due to weakness. Earlier pt had pressure feeling in chest like something was pushing on chest. Pt told Minette Brine she has "weird feeling" pt does not normally feel irregular hear beat when in a fib. Pt has had 3 cardioversions in last .month. Minette Brine is speaking with her mom and pt to see if pt will go to ED for eval and testing. Minette Brine said pt agreed to go to ED and pts daughter will take pt to ED. Then [place] spoke with [name] [facility] signed) again and since pt is not have chest pressure now [name] is going to monitor pt and if pt needs to go to ED [name] will take pt. I reviewed above symptoms with [name] and said that pt should be evaluated and [name] voiced understanding but is going to monitor pt due to last cardioversion being successful. I asked if [name] would contact pts cardiologist and review above info with them and [name] said yes. UC & ED precautions given again and note sent to Romilda Garret NP, [employer] pool and teams [place] CMA.

## 2022-04-28 NOTE — Telephone Encounter (Signed)
Pt's daughter would like a callback regarding pt feeling very fatigue and pressure around her ribs this morning. Please advise.

## 2022-04-29 ENCOUNTER — Telehealth: Payer: Self-pay

## 2022-04-29 NOTE — Telephone Encounter (Signed)
Noted. I did review the ED note

## 2022-04-29 NOTE — Telephone Encounter (Signed)
        Patient  visited [place] on 3/26    Telephone encounter attempt : 1st   A HIPAA compliant voice message was left requesting a return call.  Instructed patient to call back.    [facility] [phone] [address], [place], [place] [address] Phone: [phone] Email: [email or web address]

## 2022-04-30 ENCOUNTER — Telehealth: Payer: Self-pay

## 2022-04-30 ENCOUNTER — Ambulatory Visit: Payer: Medicare PPO | Admitting: Cardiology

## 2022-04-30 DIAGNOSIS — I4819 Other persistent atrial fibrillation: Secondary | ICD-10-CM

## 2022-04-30 NOTE — Telephone Encounter (Signed)
The patient has been notified of the result and verbalized understanding.  All questions (if any) were answered. Bernestine Amass, RN 04/30/2022 3:42 PM  Labs have been ordered. Appointment scheduled. Patient states she has already stopped amiodarone

## 2022-04-30 NOTE — Telephone Encounter (Signed)
-----   Message from Vickie Epley, MD sent at 04/26/2022  1:08 PM EDT ----- Your thyroid levels are abnormal and show too much thyroid function. This is likely because of the amiodarone. We need to stop amiodarone and recheck your thyroid function in 3 months.  Carly, can you have her stop the Amio? She can see an APP in 3 months with repeat TSH and FT4 before that appointment.  Lysbeth Galas T. Quentin Ore, MD, [facility], [facility] Cardiac Electrophysiology

## 2022-04-30 NOTE — Telephone Encounter (Signed)
     Patient  visit on 3/26  at [place]   Have you been able to follow up with your primary care physician? Yes   The patient was or was not able to obtain any needed medicine or equipment. Yes   Are there diet recommendations that you are having difficulty following? na  Patient expresses understanding of discharge instructions and education provided has no other needs at this time.  Yes      [facility] [phone] [address], [place], [place] [address] Phone: [phone] Email: [email or web address]

## 2022-05-14 DIAGNOSIS — M47812 Spondylosis without myelopathy or radiculopathy, cervical region: Secondary | ICD-10-CM | POA: Diagnosis not present

## 2022-05-14 DIAGNOSIS — M13811 Other specified arthritis, right shoulder: Secondary | ICD-10-CM | POA: Diagnosis not present

## 2022-05-14 DIAGNOSIS — M25511 Pain in right shoulder: Secondary | ICD-10-CM | POA: Diagnosis not present

## 2022-05-14 DIAGNOSIS — M542 Cervicalgia: Secondary | ICD-10-CM | POA: Diagnosis not present

## 2022-06-01 DIAGNOSIS — H903 Sensorineural hearing loss, bilateral: Secondary | ICD-10-CM | POA: Diagnosis not present

## 2022-06-01 DIAGNOSIS — H6123 Impacted cerumen, bilateral: Secondary | ICD-10-CM | POA: Diagnosis not present

## 2022-06-04 DIAGNOSIS — M13811 Other specified arthritis, right shoulder: Secondary | ICD-10-CM | POA: Diagnosis not present

## 2022-07-06 ENCOUNTER — Telehealth: Payer: Self-pay | Admitting: Nurse Practitioner

## 2022-07-06 NOTE — Telephone Encounter (Signed)
This is managed by cardiology. I will forward to Dr Azucena Cecil

## 2022-07-06 NOTE — Telephone Encounter (Signed)
Prescription Request  07/06/2022  LOV: 03/13/2022  What is the name of the medication or equipment? metoprolol tartrate (LOPRESSOR) 25 MG tablet   Have you contacted your pharmacy to request a refill? No   Which pharmacy would you like this sent to?   [id number] [place], [place] - [address] AT [facility] OF [address] [place] [address] Phone: [phone] Fax: [phone]   Patient notified that their request is being sent to the clinical staff for review and that they should receive a response within 2 business days.   Please advise at Mobile [phone] (mobile)

## 2022-07-08 ENCOUNTER — Other Ambulatory Visit: Payer: Self-pay

## 2022-07-08 DIAGNOSIS — I4891 Unspecified atrial fibrillation: Secondary | ICD-10-CM

## 2022-07-08 MED ORDER — METOPROLOL TARTRATE 25 MG PO TABS: 12.5000 mg | ORAL_TABLET | Freq: Two times a day (BID) | ORAL | 3 refills | Status: DC

## 2022-07-08 NOTE — Telephone Encounter (Signed)
Medication sent to patient's pharmacy.

## 2022-07-23 ENCOUNTER — Ambulatory Visit: Payer: Medicare PPO | Attending: Cardiology | Admitting: Cardiology

## 2022-07-23 ENCOUNTER — Encounter: Payer: Self-pay | Admitting: Cardiology

## 2022-07-23 VITALS — BP 118/72 | HR 60 | Ht 63.0 in | Wt 166.4 lb

## 2022-07-23 DIAGNOSIS — I429 Cardiomyopathy, unspecified: Secondary | ICD-10-CM

## 2022-07-23 DIAGNOSIS — I10 Essential (primary) hypertension: Secondary | ICD-10-CM

## 2022-07-23 DIAGNOSIS — I4819 Other persistent atrial fibrillation: Secondary | ICD-10-CM | POA: Diagnosis not present

## 2022-07-23 NOTE — Patient Instructions (Signed)
Medication Instructions:   Your physician recommends that you continue on your current medications as directed. Please refer to the Current Medication list given to you today.  *If you need a refill on your cardiac medications before your next appointment, please call your pharmacy*   Lab Work:  None Ordered  If you have labs (blood work) drawn today and your tests are completely normal, you will receive your results only by: MyChart Message (if you have MyChart) OR A paper copy in the mail If you have any lab test that is abnormal or we need to change your treatment, we will call you to review the results.   Testing/Procedures:  Your physician has requested that you have an echocardiogram. Echocardiography is a painless test that uses sound waves to create images of your heart. It provides your doctor with information about the size and shape of your heart and how well your heart's chambers and valves are working. This procedure takes approximately one hour. There are no restrictions for this procedure. Please do NOT wear cologne, perfume, aftershave, or lotions (deodorant is allowed). Please arrive 15 minutes prior to your appointment time.    Follow-Up: At [facility], you and your health needs are our priority.  As part of our continuing mission to provide you with exceptional heart care, we have created designated Provider Care Teams.  These Care Teams include your primary Cardiologist (physician) and Advanced Practice Providers (APPs -  Physician Assistants and Nurse Practitioners) who all work together to provide you with the care you need, when you need it.  We recommend signing up for the patient portal called "MyChart".  Sign up information is provided on this After Visit Summary.  MyChart is used to connect with patients for Virtual Visits (Telemedicine).  Patients are able to view lab/test results, encounter notes, upcoming appointments, etc.  Non-urgent messages can  be sent to your provider as well.   To learn more about what you can do with MyChart, go to [email or web address].    Your next appointment:   6 month(s)  Provider:   You may see Brian Agbor-Etang, MD or one of the following Advanced Practice Providers on your designated Care Team:   Christopher Berge, NP Ryan Dunn, PA-C Cadence Furth, PA-C Sheri Hammock, NP 

## 2022-07-23 NOTE — Progress Notes (Signed)
Cardiology Office Note:    Date:  07/23/2022   ID:  Monica Lindsey, DOB 1945/09/19, MRN [id number]  PCP:  Eden Emms, NP   [facility] Providers Cardiologist:  Debbe Odea, MD Electrophysiologist:  Lanier Prude, MD     Referring MD: Eden Emms, NP   Chief Complaint  Patient presents with   Follow-up    Patient is concerned with intermittent weakness.  Had weakness episode this morning.    History of Present Illness:    Monica Lindsey is a 77 y.o. female with a hx of persistent atrial fibrillation s/p DC cardioversion 3/24, hypertension, hyperlipidemia presents for follow-up.   Last seen for persistent A-fib, repeat DC cardioversion performed successfully 3 months ago while on amiodarone.  Tolerating Eliquis no adverse effects, no bleeding issues.  Echocardiogram 03/2022 showed moderately reduced EF.  Follow-up thyroid function testing were abnormal while on amiodarone.  Amiodarone was therefore stopped.  States having occasional lightheadedness/feeling faint when she wakes up.  Endorse not being as active as usual.  Otherwise doing okay.  Denies edema.  Prior notes Echo 03/2022 EF 35 to 40%    Past Medical History:  Diagnosis Date   Arthritis    Constipation    Frequent headaches    Hay fever    Heel spur    Hyperlipidemia    Hypertension     Past Surgical History:  Procedure Laterality Date   ABDOMINAL HYSTERECTOMY     parialt ovaries intact   BACK SURGERY  02/02/1981   CARDIOVERSION N/A 03/04/2022   Procedure: CARDIOVERSION;  Surgeon: Debbe Odea, MD;  Location: [facility] ORS;  Service: Cardiovascular;  Laterality: N/A;   CARDIOVERSION N/A 04/08/2022   Procedure: CARDIOVERSION;  Surgeon: Debbe Odea, MD;  Location: [facility] OR[facility];  Service: Cardiovascular;  Laterality: N/A;   CARPOMETACARPAL (CMC) FUSION OF THUMB Right 04/07/2017   Procedure: CARPOMETACARPAL (CMC) FUSION OF THUMB;  Surgeon: Deeann Saint, MD;  Location: [facility] OR[facility];   Service: Orthopedics;  Laterality: Right;   COLONOSCOPY     COLONOSCOPY WITH PROPOFOL N/A 09/10/2020   Procedure: COLONOSCOPY WITH PROPOFOL;  Surgeon: Midge Minium, MD;  Location: [facility] END[facility]COPY;  Service: Endoscopy;  Laterality: N/A;   HEMORRHOID SURGERY     TOOTH EXTRACTION      Current Medications: Current Meds  Medication Sig   amLODipine (NORVASC) 5 MG tablet TAKE 1 TABLET DAILY.   apixaban (ELIQUIS) 5 MG TABS tablet Take 1 tablet (5 mg total) by mouth 2 (two) times daily.   Cyanocobalamin (VITAMIN B-12 PO) Place 1 tablet under the tongue daily.   hydrochlorothiazide (HYDRODIURIL) 25 MG tablet TAKE 1 TABLET DAILY.   metoprolol tartrate (LOPRESSOR) 25 MG tablet Take 0.5 tablets (12.5 mg total) by mouth 2 (two) times daily.   Multiple Vitamin (MULTIVITAMIN) tablet Take 1 tablet by mouth daily.   Omega-3 Fatty Acids (FISH OIL) 1000 MG CAPS Take 1,000 mg by mouth 2 (two) times daily.   Vitamin D, Cholecalciferol, 25 MCG (1000 UT) TABS Take by mouth.     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   Widowed.      Still working 3 days per week.  Very close to her children and grand children.      Daughter, [name], is HPOA.  Has a living will.   Would desire CPR, would not desire prolonged life support if futile.            Social Determinants of Health   Financial Resource Strain: Low Risk   (08/01/2021)   Overall Financial Resource Strain (CARDIA)    Difficulty of Paying Living Expenses: Not hard at all  Food Insecurity: No Food Insecurity (08/01/2021)   Hunger Vital Sign    Worried About Running Out of Food in the Last Year: Never true    Ran Out of Food in the Last Year: Never true  Transportation Needs: No Transportation Needs (08/01/2021)   PRAPARE - Administrator, Civil Service (Medical): No    Lack of Transportation (Non-Medical): No  Physical Activity: Insufficiently Active (08/01/2021)   Exercise Vital Sign    Days of Exercise per Week: 2 days    Minutes of Exercise per Session: 30 min  Stress: No Stress Concern Present (08/01/2021)   [employer] of Occupational Health - Occupational Stress Questionnaire    Feeling of Stress : Not at all  Social Connections: Moderately Integrated (08/01/2021)   Social Connection and Isolation Panel [NHANES]    Frequency of Communication with Friends and Family: More than three times a week    Frequency of Social Gatherings with Friends and Family: More than three times a week    Attends Religious Services: More than 4 times per year    Active Member of [employer] or Organizations: Yes    Attends Banker Meetings: More than 4 times per year    Marital Status: Widowed     Family History: The patient's family history includes Alcohol abuse in her father; Arthritis in her father; Brain cancer in her sister; Breast cancer in her cousin; Breast cancer (age of onset: 83) in her paternal aunt; Heart disease in her father and mother; Hypertension in her father; Stroke in her father.  ROS:   Please see the history of present illness.     All other systems reviewed and are negative.  EKGs/Labs/Other Studies Reviewed:    The following studies were reviewed today:   EKG:  EKG is  ordered today.  The ekg ordered today demonstrates sinus rhythm, heart rate 60  Recent Labs: 11/26/2021: Pro B Natriuretic peptide  (BNP) 189.0 04/22/2022: TSH 0.013 04/28/2022: ALT 32; BUN 32; Creatinine, Ser 1.69; Hemoglobin 14.1; Platelets 210; Potassium 4.2; Sodium 138  Recent Lipid Panel    Component Value Date/Time   CHOL 211 (H) 10/03/2021 0935   TRIG 121.0 10/03/2021 0935   HDL 59.60 10/03/2021 0935   CHOLHDL 4 10/03/2021 0935   VLDL 24.2 10/03/2021 0935   LDLCALC 127 (H) 10/03/2021 0935   LDLDIRECT 127.3 03/23/2013 1459     Risk Assessment/Calculations:             Physical Exam:    VS:  BP 118/72 (BP Location: Left Arm, Patient Position: Sitting, Cuff Size: Normal)   Pulse 60  Ht 5\' 3"  (1.6 m)   Wt 166 lb 6.4 oz (75.5 kg)   SpO2 97%   BMI 29.48 kg/m     Wt Readings from Last 3 Encounters:  07/23/22 166 lb 6.4 oz (75.5 kg)  04/28/22 167 lb 8 oz (76 kg)  04/22/22 174 lb 6.4 oz (79.1 kg)     GEN:  Well nourished, well developed in no acute distress HEENT: Normal NECK: No JVD; No carotid bruits CARDIAC: Regular rate and rhythm RESPIRATORY:  Clear to auscultation without rales, wheezing or rhonchi  ABDOMEN: Soft, non-tender, non-distended MUSCULOSKELETAL:  No edema; No deformity  SKIN: Warm and dry NEUROLOGIC:  Alert and oriented x 3 PSYCHIATRIC:  Normal affect   ASSESSMENT:    1. Persistent atrial fibrillation (HCC)   2. Primary hypertension   3. Cardiomyopathy, unspecified type (HCC)    PLAN:    In order of problems listed above:  Persistent atrial fibrillation, s/p DC cardioversion 3/24.  Amiodarone stopped due to thyroid dysfunction.  Maintaining sinus rhythm.  Continue Lopressor 12.5 mg twice daily, Eliquis 5 mg twice daily.  Repeat TSH schedule for A-fib clinic.  Keep appointment with EP/A-fib clinic. Hypertension, BP controlled.  BP controlled.  Continue Lopressor, Norvasc, HCTZ.  Cardiomyopathy, last EF 35 to 40%, possibly tachycardia induced.  Repeat echocardiogram.  If EF stays low, plan to implement GDMT.  Follow-up in 6 months.      Medication Adjustments/Labs and  Tests Ordered: Current medicines are reviewed at length with the patient today.  Concerns regarding medicines are outlined above.  Orders Placed This Encounter  Procedures   EKG 12-Lead   ECHOCARDIOGRAM COMPLETE   No orders of the defined types were placed in this encounter.   Patient Instructions  Medication Instructions:   Your physician recommends that you continue on your current medications as directed. Please refer to the Current Medication list given to you today.  *If you need a refill on your cardiac medications before your next appointment, please call your pharmacy*   Lab Work:  None Ordered  If you have labs (blood work) drawn today and your tests are completely normal, you will receive your results only by: MyChart Message (if you have MyChart) OR A paper copy in the mail If you have any lab test that is abnormal or we need to change your treatment, we will call you to review the results.   Testing/Procedures:  Your physician has requested that you have an echocardiogram. Echocardiography is a painless test that uses sound waves to create images of your heart. It provides your doctor with information about the size and shape of your heart and how well your heart's chambers and valves are working. This procedure takes approximately one hour. There are no restrictions for this procedure. Please do NOT wear cologne, perfume, aftershave, or lotions (deodorant is allowed). Please arrive 15 minutes prior to your appointment time.    Follow-Up: At [facility], you and your health needs are our priority.  As part of our continuing mission to provide you with exceptional heart care, we have created designated Provider Care Teams.  These Care Teams include your primary Cardiologist (physician) and Advanced Practice Providers (APPs -  Physician Assistants and Nurse Practitioners) who all work together to provide you with the care you need, when you need it.  We  recommend signing up for the patient portal called "MyChart".  Sign up information is provided on this After Visit Summary.  MyChart is used to connect  with patients for Virtual Visits (Telemedicine).  Patients are able to view lab/test results, encounter notes, upcoming appointments, etc.  Non-urgent messages can be sent to your provider as well.   To learn more about what you can do with MyChart, go to [email or web address].    Your next appointment:   6 month(s)  Provider:   You may see Debbe Odea, MD or one of the following Advanced Practice Providers on your designated Care Team:   Nicolasa Ducking, NP Eula Listen, PA-C Cadence Fransico Michael, PA-C Charlsie Quest, NP    Signed, Debbe Odea, MD  07/23/2022 10:29 AM     [facility]

## 2022-08-04 ENCOUNTER — Ambulatory Visit (INDEPENDENT_AMBULATORY_CARE_PROVIDER_SITE_OTHER): Payer: Medicare PPO

## 2022-08-04 VITALS — Ht 63.0 in | Wt 165.5 lb

## 2022-08-04 DIAGNOSIS — Z0000 Encounter for general adult medical examination without abnormal findings: Secondary | ICD-10-CM | POA: Diagnosis not present

## 2022-08-04 NOTE — Patient Instructions (Signed)
Monica Lindsey , Thank you for taking time to come for your Medicare Wellness Visit. I appreciate your ongoing commitment to your health goals. Please review the following plan we discussed and let me know if I can assist you in the future.   These are the goals we discussed:  Goals       Maintain a healthy diet      Patient Stated      08/04/2022, wants to lose 20 pounds      Stay healthy (pt-stated)      Continue to spend time with  my family        This is a list of the screening recommended for you and due dates:  Health Maintenance  Topic Date Due   Pneumonia Vaccine (2 of 2 - PPSV23 or PCV20) 05/18/2014   DTaP/Tdap/Td vaccine (3 - Td or Tdap) 10/26/2017   Zoster (Shingles) Vaccine (2 of 2) 12/15/2018   COVID-19 Vaccine (5 - 2023-24 season) 10/03/2021   Flu Shot  09/03/2022   Medicare Annual Wellness Visit  08/04/2023   DEXA scan (bone density measurement)  Completed   Hepatitis C Screening  Completed   HPV Vaccine  Aged Out   Colon Cancer Screening  Discontinued    Advanced directives: Please bring a copy of your POA (Power of Amesti) and/or Living Will to your next appointment.   Conditions/risks identified: none  Next appointment: Follow up in one year for your annual wellness visit    Preventive Care 65 Years and Older, Female Preventive care refers to lifestyle choices and visits with your health care provider that can promote health and wellness. What does preventive care include? A yearly physical exam. This is also called an annual well check. Dental exams once or twice a year. Routine eye exams. Ask your health care provider how often you should have your eyes checked. Personal lifestyle choices, including: Daily care of your teeth and gums. Regular physical activity. Eating a healthy diet. Avoiding tobacco and drug use. Limiting alcohol use. Practicing safe sex. Taking low-dose aspirin every day. Taking vitamin and mineral supplements as recommended by  your health care provider. What happens during an annual well check? The services and screenings done by your health care provider during your annual well check will depend on your age, overall health, lifestyle risk factors, and family history of disease. Counseling  Your health care provider may ask you questions about your: Alcohol use. Tobacco use. Drug use. Emotional well-being. Home and relationship well-being. Sexual activity. Eating habits. History of falls. Memory and ability to understand (cognition). Work and work Astronomer. Reproductive health. Screening  You may have the following tests or measurements: Height, weight, and BMI. Blood pressure. Lipid and cholesterol levels. These may be checked every 5 years, or more frequently if you are over 16 years old. Skin check. Lung cancer screening. You may have this screening every year starting at age 50 if you have a 30-pack-year history of smoking and currently smoke or have quit within the past 15 years. Fecal occult blood test (FOBT) of the stool. You may have this test every year starting at age 3. Flexible sigmoidoscopy or colonoscopy. You may have a sigmoidoscopy every 5 years or a colonoscopy every 10 years starting at age 1. Hepatitis C blood test. Hepatitis B blood test. Sexually transmitted disease (STD) testing. Diabetes screening. This is done by checking your blood sugar (glucose) after you have not eaten for a while (fasting). You may have this done  every 1-3 years. Bone density scan. This is done to screen for osteoporosis. You may have this done starting at age 44. Mammogram. This may be done every 1-2 years. Talk to your health care provider about how often you should have regular mammograms. Talk with your health care provider about your test results, treatment options, and if necessary, the need for more tests. Vaccines  Your health care provider may recommend certain vaccines, such as: Influenza  vaccine. This is recommended every year. Tetanus, diphtheria, and acellular pertussis (Tdap, Td) vaccine. You may need a Td booster every 10 years. Zoster vaccine. You may need this after age 75. Pneumococcal 13-valent conjugate (PCV13) vaccine. One dose is recommended after age 8. Pneumococcal polysaccharide (PPSV23) vaccine. One dose is recommended after age 82. Talk to your health care provider about which screenings and vaccines you need and how often you need them. This information is not intended to replace advice given to you by your health care provider. Make sure you discuss any questions you have with your health care provider. Document Released: 02/15/2015 Document Revised: 10/09/2015 Document Reviewed: 11/20/2014 Elsevier Interactive Patient Education  2017 [place] Prevention in the Home Falls can cause injuries. They can happen to people of all ages. There are many things you can do to make your home safe and to help prevent falls. What can I do on the outside of my home? Regularly fix the edges of walkways and driveways and fix any cracks. Remove anything that might make you trip as you walk through a door, such as a raised step or threshold. Trim any bushes or trees on the path to your home. Use bright outdoor lighting. Clear any walking paths of anything that might make someone trip, such as rocks or tools. Regularly check to see if handrails are loose or broken. Make sure that both sides of any steps have handrails. Any raised decks and porches should have guardrails on the edges. Have any leaves, snow, or ice cleared regularly. Use sand or salt on walking paths during winter. Clean up any spills in your garage right away. This includes oil or grease spills. What can I do in the bathroom? Use night lights. Install grab bars by the toilet and in the tub and shower. Do not use towel bars as grab bars. Use non-skid mats or decals in the tub or shower. If you  need to sit down in the shower, use a plastic, non-slip stool. Keep the floor dry. Clean up any water that spills on the floor as soon as it happens. Remove soap buildup in the tub or shower regularly. Attach bath mats securely with double-sided non-slip rug tape. Do not have throw rugs and other things on the floor that can make you trip. What can I do in the bedroom? Use night lights. Make sure that you have a light by your bed that is easy to reach. Do not use any sheets or blankets that are too big for your bed. They should not hang down onto the floor. Have a firm chair that has side arms. You can use this for support while you get dressed. Do not have throw rugs and other things on the floor that can make you trip. What can I do in the kitchen? Clean up any spills right away. Avoid walking on wet floors. Keep items that you use a lot in easy-to-reach places. If you need to reach something above you, use a strong step stool that has  a grab bar. Keep electrical cords out of the way. Do not use floor polish or wax that makes floors slippery. If you must use wax, use non-skid floor wax. Do not have throw rugs and other things on the floor that can make you trip. What can I do with my stairs? Do not leave any items on the stairs. Make sure that there are handrails on both sides of the stairs and use them. Fix handrails that are broken or loose. Make sure that handrails are as long as the stairways. Check any carpeting to make sure that it is firmly attached to the stairs. Fix any carpet that is loose or worn. Avoid having throw rugs at the top or bottom of the stairs. If you do have throw rugs, attach them to the floor with carpet tape. Make sure that you have a light switch at the top of the stairs and the bottom of the stairs. If you do not have them, ask someone to add them for you. What else can I do to help prevent falls? Wear shoes that: Do not have high heels. Have rubber  bottoms. Are comfortable and fit you well. Are closed at the toe. Do not wear sandals. If you use a stepladder: Make sure that it is fully opened. Do not climb a closed stepladder. Make sure that both sides of the stepladder are locked into place. Ask someone to hold it for you, if possible. Clearly mark and make sure that you can see: Any grab bars or handrails. First and last steps. Where the edge of each step is. Use tools that help you move around (mobility aids) if they are needed. These include: Canes. Walkers. Scooters. Crutches. Turn on the lights when you go into a dark area. Replace any light bulbs as soon as they burn out. Set up your furniture so you have a clear path. Avoid moving your furniture around. If any of your floors are uneven, fix them. If there are any pets around you, be aware of where they are. Review your medicines with your doctor. Some medicines can make you feel dizzy. This can increase your chance of falling. Ask your doctor what other things that you can do to help prevent falls. This information is not intended to replace advice given to you by your health care provider. Make sure you discuss any questions you have with your health care provider. Document Released: 11/15/2008 Document Revised: 06/27/2015 Document Reviewed: 02/23/2014 Elsevier Interactive Patient Education  2017 [employer].

## 2022-08-04 NOTE — Progress Notes (Signed)
Subjective:   Monica Lindsey is a 77 y.o. female who presents for Medicare Annual (Subsequent) preventive examination.  Visit Complete: Virtual  I connected with  [name] on 08/04/22 by a audio enabled telemedicine application and verified that I am speaking with the correct person using two identifiers.  Patient Location: Home  Provider Location: Office/Clinic  I discussed the limitations of evaluation and management by telemedicine. The patient expressed understanding and agreed to proceed.    Review of Systems     Cardiac Risk Factors include: advanced age (>47men, >74 women);hypertension;dyslipidemia     Objective:    Today's Vitals   08/04/22 1458 08/04/22 1459  Weight: 165 lb 8 oz (75.1 kg)   Height: 5\' 3"  (1.6 m)   PainSc:  3    Body mass index is 29.32 kg/m.     08/04/2022    3:08 PM 04/08/2022    7:20 AM 03/04/2022    6:57 AM 08/01/2021   11:38 AM 09/18/2020    2:10 PM 09/10/2020    9:28 AM 03/26/2020   10:41 AM  Advanced Directives  Does Patient Have a Medical Advance Directive? Yes No Yes Yes Yes Yes Yes  Type of Estate agent of [place];Living will  Healthcare Power of [place];Living will Healthcare Power of [place];Living will Healthcare Power of [place];Living will Healthcare Power of [place];Living will Healthcare Power of [place];Living will  Does patient want to make changes to medical advance directive?    No - Patient declined No - Patient declined    Copy of Healthcare Power of Attorney in Chart? No - copy requested   No - copy requested   No - copy requested  Would patient like information on creating a medical advance directive?  No - Patient declined         Current Medications (verified) Outpatient Encounter Medications as of 08/04/2022  Medication Sig   amLODipine (NORVASC) 5 MG tablet TAKE 1 TABLET DAILY.   apixaban (ELIQUIS) 5 MG TABS tablet Take 1 tablet (5 mg total) by mouth 2 (two) times daily.    Cyanocobalamin (VITAMIN B-12 PO) Place 1 tablet under the tongue daily.   hydrochlorothiazide (HYDRODIURIL) 25 MG tablet TAKE 1 TABLET DAILY.   metoprolol tartrate (LOPRESSOR) 25 MG tablet Take 0.5 tablets (12.5 mg total) by mouth 2 (two) times daily.   Multiple Vitamin (MULTIVITAMIN) tablet Take 1 tablet by mouth daily.   Omega-3 Fatty Acids (FISH OIL) 1000 MG CAPS Take 1,000 mg by mouth 2 (two) times daily.   Vitamin D, Cholecalciferol, 25 MCG (1000 UT) TABS Take by mouth.   benzonatate (TESSALON) 100 MG capsule Take 1 capsule (100 mg total) by mouth 3 (three) times daily as needed for cough. (Patient not taking: Reported on 07/23/2022)   cetirizine (ZYRTEC) 10 MG tablet Take 1 tablet (10 mg total) by mouth daily. (Patient not taking: Reported on 07/23/2022)   fluticasone (FLONASE) 50 MCG/ACT nasal spray Place 2 sprays into both nostrils daily. (Patient not taking: Reported on 07/23/2022)   guaiFENesin (MUCINEX) 600 MG 12 hr tablet Take 1 tablet (600 mg total) by mouth 2 (two) times daily. (Patient not taking: Reported on 07/23/2022)   No facility-administered encounter medications on file as of 08/04/2022.    Allergies (verified) Demerol [meperidine]   History: Past Medical History:  Diagnosis Date   Arthritis    Constipation    Frequent headaches    Hay fever    Heel spur    Hyperlipidemia  Hypertension    Past Surgical History:  Procedure Laterality Date   ABDOMINAL HYSTERECTOMY     parialt ovaries intact   BACK SURGERY  02/02/1981   CARDIOVERSION N/A 03/04/2022   Procedure: CARDIOVERSION;  Surgeon: Debbe Odea, MD;  Location: [facility] ORS;  Service: Cardiovascular;  Laterality: N/A;   CARDIOVERSION N/A 04/08/2022   Procedure: CARDIOVERSION;  Surgeon: Debbe Odea, MD;  Location: [facility] ORS;  Service: Cardiovascular;  Laterality: N/A;   CARPOMETACARPAL (CMC) FUSION OF THUMB Right 04/07/2017   Procedure: CARPOMETACARPAL (CMC) FUSION OF THUMB;  Surgeon: Deeann Saint, MD;   Location: [facility] ORS;  Service: Orthopedics;  Laterality: Right;   COLONOSCOPY     COLONOSCOPY WITH PROPOFOL N/A 09/10/2020   Procedure: COLONOSCOPY WITH PROPOFOL;  Surgeon: Midge Minium, MD;  Location: [facility] ENDOSCOPY;  Service: Endoscopy;  Laterality: N/A;   HEMORRHOID SURGERY     TOOTH EXTRACTION     Family History  Problem Relation Age of Onset   Heart disease Mother    Alcohol abuse Father    Arthritis Father    Heart disease Father    Stroke Father    Hypertension Father    Brain cancer Sister    Breast cancer Paternal Aunt 66   Breast cancer Cousin    Social History   Socioeconomic History   Marital status: Widowed    Spouse name: Not on file   Number of children: Not on file   Years of education: Not on file   Highest education level: Not on file  Occupational History   Not on file  Tobacco Use   Smoking status: Never    Passive exposure: Past   Smokeless tobacco: Never  Vaping Use   Vaping Use: Never used  Substance and Sexual Activity   Alcohol use: Not Currently   Drug use: No   Sexual activity: Never  Other Topics Concern   Not on file  Social History Narrative   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   Widowed.      Still working 3 days per week.  Very close to her children and grand children.      Daughter, Monica Lindsey, is HPOA.  Has a living will.   Would desire CPR, would not desire prolonged life support if futile.            Social Determinants of Health   Financial Resource Strain: Low Risk  (08/04/2022)   Overall Financial Resource Strain (CARDIA)    Difficulty of Paying Living Expenses: Not hard at all  Food Insecurity: No Food Insecurity (08/04/2022)   Hunger Vital Sign    Worried About Running Out of Food in the Last Year: Never true    Ran Out of Food in the Last Year: Never true  Transportation Needs: No Transportation Needs (08/04/2022)   PRAPARE - Administrator, Civil Service (Medical): No    Lack of  Transportation (Non-Medical): No  Physical Activity: Insufficiently Active (08/04/2022)   Exercise Vital Sign    Days of Exercise per Week: 3 days    Minutes of Exercise per Session: 10 min  Stress: No Stress Concern Present (08/04/2022)   [employer] of Occupational Health - Occupational Stress Questionnaire    Feeling of Stress : Not at all  Social Connections: Moderately Integrated (08/04/2022)   Social Connection and Isolation Panel [NHANES]    Frequency of Communication with Friends and Family: More than three times a week    Frequency of Social  Gatherings with Friends and Family: Once a week    Attends Religious Services: More than 4 times per year    Active Member of Clubs or Organizations: Yes    Attends Banker Meetings: More than 4 times per year    Marital Status: Widowed    Tobacco Counseling Counseling given: Not Answered   Clinical Intake:  Pre-visit preparation completed: Yes  Pain : 0-10 Pain Score: 3  Pain Type: Chronic pain Pain Location: Back Pain Orientation: Lower Pain Descriptors / Indicators: Aching Pain Onset: More than a month ago Pain Frequency: Constant     Nutritional Status: BMI 25 -29 Overweight Nutritional Risks: None Diabetes: No  How often do you need to have someone help you when you read instructions, pamphlets, or other written materials from your doctor or pharmacy?: 1 - Never  Interpreter Needed?: No  Information entered by :: NAllen LPN   Activities of Daily Living    08/04/2022    3:01 PM 04/08/2022    7:18 AM  In your present state of health, do you have any difficulty performing the following activities:  Hearing? 0 0  Vision? 0 0  Difficulty concentrating or making decisions? 0 0  Walking or climbing stairs? 0 0  Dressing or bathing? 0 0  Doing errands, shopping? 0   Preparing Food and eating ? N   Using the Toilet? N   In the past six months, have you accidently leaked urine? N   Do you have problems  with loss of bowel control? N   Managing your Medications? N   Managing your Finances? N   Housekeeping or managing your Housekeeping? N     Patient Care Team: Eden Emms, NP as PCP - General (Pain Medicine) Debbe Odea, MD as PCP - Cardiology (Cardiology) Lanier Prude, MD as PCP - Electrophysiology (Cardiology) Oh, Ezzard Standing, MD (Inactive) as Physician Assistant (Internal Medicine) Trish Mage, MD as Counselor (Dermatology)  Indicate any recent Medical Services you may have received from other than [facility] providers in the past year (date may be approximate).     Assessment:   This is a routine wellness examination for [place].  Hearing/Vision screen Hearing Screening - Comments:: Slight decrease in one ear Vision Screening - Comments:: No regular eye exams,  Dietary issues and exercise activities discussed:     Goals Addressed             This Visit's Progress    Patient Stated       08/04/2022, wants to lose 20 pounds       Depression Screen    08/04/2022    3:14 PM 08/01/2021   11:33 AM 09/18/2020    2:15 PM 03/26/2020   10:48 AM 10/27/2019    8:20 AM 04/20/2018   11:36 AM 07/19/2017    8:55 AM  PHQ 2/9 Scores  PHQ - 2 Score 0 0 0 0 1 0 0  PHQ- 9 Score 3          Fall Risk    08/04/2022    3:09 PM 03/13/2022    4:10 PM 08/01/2021   11:36 AM 09/18/2020    2:15 PM 03/26/2020   10:45 AM  Fall Risk   Falls in the past year? 1 0 0 0 1  Comment stepped back, slipped on cardboard      Number falls in past yr: 1 0 0 0 1  Injury with Fall? 0 0 0  0  Risk for fall due to : Medication side effect No Fall Risks No Fall Risks  History of fall(s)  Follow up Falls prevention discussed;Falls evaluation completed Falls evaluation completed   Falls prevention discussed    MEDICARE RISK AT HOME:  Medicare Risk at Home - 08/04/22 1512     Any stairs in or around the home? Yes    If so, are there any without handrails? No    Home free of loose throw rugs  in walkways, pet beds, electrical cords, etc? Yes    Adequate lighting in your home to reduce risk of falls? Yes    Life alert? No    Use of a cane, walker or w/c? No    Grab bars in the bathroom? Yes    Shower chair or bench in shower? Yes    Elevated toilet seat or a handicapped toilet? No             TIMED UP AND GO:  Was the test performed?  No    Cognitive Function:    04/20/2018   11:38 AM 07/09/2016   11:15 AM  MMSE - Mini Mental State Exam  Orientation to time 5 5  Orientation to Place 5 5  Registration 3 3  Attention/ Calculation 5 0  Recall 3 3  Language- name 2 objects 2 0  Language- repeat 1 1  Language- follow 3 step command 3 3  Language- read & follow direction 1 0  Write a sentence 1 0  Copy design 1 0  Total score 30 20        08/04/2022    3:15 PM 08/01/2021   11:39 AM  6CIT Screen  What Year? 0 points 0 points  What month? 0 points 0 points  What time? 0 points 0 points  Count back from 20 0 points 0 points  Months in reverse 2 points 0 points  Repeat phrase 0 points 0 points  Total Score 2 points 0 points    Immunizations Immunization History  Administered Date(s) Administered   DTaP 10/27/2007   Fluad Quad(high Dose 65+) 10/05/2018, 10/27/2019, 03/14/2021, 11/26/2021   Influenza, Seasonal, Injecte, Preservative Fre 03/06/2009, 02/10/2011, 11/24/2011   Influenza,inj,Quad PF,6+ Mos 12/06/2013, 10/10/2014, 11/27/2015, 12/01/2016   Influenza,inj,quad, With Preservative 11/02/2017   PFIZER(Purple Top)SARS-COV-2 Vaccination 02/09/2019, 03/02/2019, 11/07/2019   Pfizer Covid-19 Vaccine Bivalent Booster 64yrs & up 08/28/2020   Pneumococcal Conjugate-13 05/17/2013   Pneumococcal-Unspecified 03/06/2009   Tdap 10/27/2007   Zoster Recombinant(Shingrix) 10/20/2018, 10/20/2018   Zoster, Live 10/27/2007    TDAP status: Due, Education has been provided regarding the importance of this vaccine. Advised may receive this vaccine at local pharmacy or  Health Dept. Aware to provide a copy of the vaccination record if obtained from local pharmacy or Health Dept. Verbalized acceptance and understanding.  Flu Vaccine status: Up to date  Pneumococcal vaccine status: Up to date  Covid-19 vaccine status: Information provided on how to obtain vaccines.   Qualifies for Shingles Vaccine? Yes   Zostavax completed Yes   Shingrix Completed?: needs second dose  Screening Tests Health Maintenance  Topic Date Due   Pneumonia Vaccine 89+ Years old (2 of 2 - PPSV23 or PCV20) 05/18/2014   DTaP/Tdap/Td (3 - Td or Tdap) 10/26/2017   Zoster Vaccines- Shingrix (2 of 2) 12/15/2018   COVID-19 Vaccine (5 - 2023-24 season) 10/03/2021   INFLUENZA VACCINE  09/03/2022   Medicare Annual Wellness (AWV)  08/04/2023   DEXA SCAN  Completed  Hepatitis C Screening  Completed   HPV VACCINES  Aged Out   Colonoscopy  Discontinued    Health Maintenance  Health Maintenance Due  Topic Date Due   Pneumonia Vaccine 9+ Years old (2 of 2 - PPSV23 or PCV20) 05/18/2014   DTaP/Tdap/Td (3 - Td or Tdap) 10/26/2017   Zoster Vaccines- Shingrix (2 of 2) 12/15/2018   COVID-19 Vaccine (5 - 2023-24 season) 10/03/2021    Colorectal cancer screening: No longer required.   Mammogram status: Completed 04/28/2022. Repeat every year  Bone Density status: Ordered 10/03/2021. Pt provided with contact info and advised to call to schedule appt.  Lung Cancer Screening: (Low Dose CT Chest recommended if Age 70-80 years, 20 pack-year currently smoking OR have quit w/in 15years.) does not qualify.   Lung Cancer Screening Referral: no  Additional Screening:  Hepatitis C Screening: does qualify; Completed 07/09/2016  Vision Screening: Recommended annual ophthalmology exams for early detection of glaucoma and other disorders of the eye. Is the patient up to date with their annual eye exam?  No  Who is the provider or what is the name of the office in which the patient attends annual eye  exams? none If pt is not established with a provider, would they like to be referred to a provider to establish care? No .   Dental Screening: Recommended annual dental exams for proper oral hygiene  Diabetic Foot Exam: n/a  Community Resource Referral / Chronic Care Management: CRR required this visit?  No   CCM required this visit?  No     Plan:     I have personally reviewed and noted the following in the patient's chart:   Medical and social history Use of alcohol, tobacco or illicit drugs  Current medications and supplements including opioid prescriptions. Patient is not currently taking opioid prescriptions. Functional ability and status Nutritional status Physical activity Advanced directives List of other physicians Hospitalizations, surgeries, and ER visits in previous 12 months Vitals Screenings to include cognitive, depression, and falls Referrals and appointments  In addition, I have reviewed and discussed with patient certain preventive protocols, quality metrics, and best practice recommendations. A written personalized care plan for preventive services as well as general preventive health recommendations were provided to patient.     Barb Merino, LPN   02/07/1094   After Visit Summary: (MyChart) Due to this being a telephonic visit, the after visit summary with patients personalized plan was offered to patient via MyChart   Nurse Notes: none

## 2022-08-05 ENCOUNTER — Ambulatory Visit: Payer: Medicare PPO | Attending: Cardiology

## 2022-08-05 DIAGNOSIS — I4819 Other persistent atrial fibrillation: Secondary | ICD-10-CM | POA: Diagnosis not present

## 2022-08-05 MED ORDER — PERFLUTREN LIPID MICROSPHERE
1.0000 mL | INTRAVENOUS | Status: AC | PRN
  Administered 2022-08-05: 2 mL via INTRAVENOUS

## 2022-08-06 LAB — ECHOCARDIOGRAM LIMITED: S' Lateral: 3.1 cm

## 2022-08-14 DIAGNOSIS — I4819 Other persistent atrial fibrillation: Secondary | ICD-10-CM | POA: Insufficient documentation

## 2022-08-14 NOTE — Progress Notes (Signed)
Cardiology Office Note Date:  08/18/2022  Patient ID:  Monica Lindsey, Monica Lindsey [birth year], MRN [id number] PCP:  Eden Emms, NP  Cardiologist:  Debbe Odea, MD Electrophysiologist: Lanier Prude, MD   Chief Complaint: afib  History of Present Illness: Monica Lindsey is a 77 y.o. female with PMH notable for persis Afib, HTN; seen today for Lanier Prude, MD for routine electrophysiology followup.  She last saw Dr. Lalla Brothers 04/2022 for post cardioversion appointment.  She had successful cardioversion 04/08/2022.  At the appointment she was maintaining sinus rhythm. Routine amio labs to monitor thyroid and liver function at that appointment showed hypothyroid, so amiodarone was stopped. She had routine follow-up with Dr. Myriam Forehand 07/2022, was maintaining normal sinus rhythm off amio.  On follow-up today, patient feels well.  She denies palpitations, shortness of breath, chest pain.  She had subtle symptoms of A-fib, shortness of breath with walking up an incline and some increased lower extremity edema.  She denies both of these complaints currently.  She diligently takes Eliquis twice daily, no bleeding concerns.  She brings a blood pressure log with home readings.  Most readings systolic 100-1 30, rarely 140-150.  AAD History: Amidarone - hypothyroid, stopped 04/2022  Past Medical History:  Diagnosis Date   Arthritis    Constipation    Frequent headaches    Hay fever    Heel spur    Hyperlipidemia    Hypertension     Past Surgical History:  Procedure Laterality Date   ABDOMINAL HYSTERECTOMY     parialt ovaries intact   BACK SURGERY  02/02/1981   CARDIOVERSION N/A 03/04/2022   Procedure: CARDIOVERSION;  Surgeon: Debbe Odea, MD;  Location: [facility] ORS;  Service: Cardiovascular;  Laterality: N/A;   CARDIOVERSION N/A 04/08/2022   Procedure: CARDIOVERSION;  Surgeon: Debbe Odea, MD;  Location: [facility] ORS;  Service: Cardiovascular;  Laterality: N/A;   CARPOMETACARPAL  (CMC) FUSION OF THUMB Right 04/07/2017   Procedure: CARPOMETACARPAL (CMC) FUSION OF THUMB;  Surgeon: Deeann Saint, MD;  Location: [facility] ORS;  Service: Orthopedics;  Laterality: Right;   COLONOSCOPY     COLONOSCOPY WITH PROPOFOL N/A 09/10/2020   Procedure: COLONOSCOPY WITH PROPOFOL;  Surgeon: Midge Minium, MD;  Location: [facility] ENDOSCOPY;  Service: Endoscopy;  Laterality: N/A;   HEMORRHOID SURGERY     TOOTH EXTRACTION      Current Outpatient Medications  Medication Instructions   amLODipine (NORVASC) 5 MG tablet TAKE 1 TABLET DAILY.   apixaban (ELIQUIS) 5 mg, Oral, 2 times daily   benzonatate (TESSALON) 100 mg, Oral, 3 times daily PRN   cetirizine (ZYRTEC) 10 mg, Oral, Daily   Cyanocobalamin (VITAMIN B-12 PO) 1 tablet, Sublingual, Daily   Fish Oil 1,000 mg, Oral, 2 times daily   fluticasone (FLONASE) 50 MCG/ACT nasal spray 2 sprays, Each Nare, Daily   guaiFENesin (MUCINEX) 600 mg, Oral, 2 times daily   hydrochlorothiazide (HYDRODIURIL) 25 MG tablet TAKE 1 TABLET DAILY.   metoprolol tartrate (LOPRESSOR) 12.5 mg, Oral, 2 times daily   Multiple Vitamin (MULTIVITAMIN) tablet 1 tablet, Oral, Daily   Vitamin D, Cholecalciferol, 25 MCG (1000 UT) TABS Oral    Social History:  The patient  reports that she has never smoked. She has been exposed to tobacco smoke. She has never used smokeless tobacco. She reports that she does not currently use alcohol. She reports that she does not use drugs.   Family History:  The patient's family history includes Alcohol abuse in her father; Arthritis in her  father; Brain cancer in her sister; Breast cancer in her cousin; Breast cancer (age of onset: 87) in her paternal aunt; Heart disease in her father and mother; Hypertension in her father; Stroke in her father.  ROS:  Please see the history of present illness. All other systems are reviewed and otherwise negative.   PHYSICAL EXAM:  VS:  BP 126/72 (BP Location: Left Arm, Patient Position: Sitting, Cuff Size:  Normal)   Pulse (!) 57   Ht 5\' 3"  (1.6 m)   Wt 168 lb (76.2 kg)   SpO2 97%   BMI 29.76 kg/m  BMI: Body mass index is 29.76 kg/m.  GEN- The patient is well appearing, alert and oriented x 3 today.   Lungs- Clear to ausculation bilaterally, normal work of breathing.  Heart- Regular rate and rhythm, no murmurs, rubs or gallops Extremities- Trace L ankle edema, warm, dry   EKG is ordered. Personal review of EKG from today shows:    EKG Interpretation Date/Time:  Tuesday August 18 2022 09:14:49 EDT Ventricular Rate:  57 PR Interval:  156 QRS Duration:  86 QT Interval:  456 QTC Calculation: 443 R Axis:   -61  Text Interpretation: Sinus bradycardia Left anterior fascicular block Anterior infarct , age undetermined When compared with ECG of 28-Apr-2022 16:07, Inverted T waves have replaced nonspecific T wave abnormality in Inferior leads Confirmed by Sherie Don [phone]) on 08/18/2022 9:26:20 AM    Recent Labs: 11/26/2021: Pro B Natriuretic peptide (BNP) 189.0 04/22/2022: TSH 0.013 04/28/2022: ALT 32; BUN 32; Creatinine, Ser 1.69; Hemoglobin 14.1; Platelets 210; Potassium 4.2; Sodium 138  10/03/2021: Cholesterol 211; HDL 59.60; LDL Cholesterol 127; Total CHOL/HDL Ratio 4; Triglycerides 121.0; VLDL 24.2   CrCl cannot be calculated (Patient's most recent lab result is older than the maximum 21 days allowed.).   Wt Readings from Last 3 Encounters:  08/18/22 168 lb (76.2 kg)  08/04/22 165 lb 8 oz (75.1 kg)  07/23/22 166 lb 6.4 oz (75.5 kg)     Additional studies reviewed include: Previous EP, cardiology notes.   TTE, 08/06/2022  1. Left ventricular ejection fraction, by estimation, is 50 to 55%. The left ventricle has low normal function. The left ventricle has no regional wall motion abnormalities.   2. Right ventricular systolic function is normal. The right ventricular size is normal. There is normal pulmonary artery systolic pressure. The estimated right ventricular systolic pressure  is 34.4 mmHg.   3. Left atrial size was mildly dilated.   4. Right atrial size was moderately dilated.   5. The mitral valve is normal in structure. No evidence of mitral valve regurgitation. No evidence of mitral stenosis.   6. Tricuspid valve regurgitation is mild to moderate.   7. The aortic valve is tricuspid. Aortic valve regurgitation is not visualized. Aortic valve sclerosis is present, with no evidence of aortic valve stenosis.   8. The inferior vena cava is normal in size with greater than 50% respiratory variability, suggesting right atrial pressure of 3 mmHg.   TTE, 03/20/2022  1. Left ventricular ejection fraction, by estimation, is 35 to 40%. Left ventricular ejection fraction by PLAX is 37 %. The left ventricle has moderately decreased function. The left ventricle demonstrates global hypokinesis. Left ventricular diastolic parameters are indeterminate.   2. Right ventricular systolic function is normal. The right ventricular size is normal. There is normal pulmonary artery systolic pressure. The estimated right ventricular systolic pressure is 26.5 mmHg.   3. A small pericardial effusion is present. 1  cm off the LV free wall. There is no evidence of cardiac tamponade.   4. The mitral valve is normal in structure. No evidence of mitral valve regurgitation. No evidence of mitral stenosis.   5. Tricuspid valve regurgitation is moderate.   6. The aortic valve has an indeterminant number of cusps. Aortic valve regurgitation is not visualized. Aortic valve sclerosis is present, with no evidence of aortic valve stenosis.   7. The inferior vena cava is normal in size with greater than 50% respiratory variability, suggesting right atrial pressure of 3 mmHg.    ASSESSMENT AND PLAN:  #) persis Afib S/p successful DCCV 04/2022 Previously on amiodarone, stopped 04/2022 after hypothyroid noted Will update final amiodarone labs today  #) hypercoag d/t afib CHA2DS2-VASc Score = 5 [CHF History:  1, HTN History: 1, Diabetes History: 0, Stroke History: 0, Vascular Disease History: 0, Age Score: 2, Gender Score: 1].  Therefore, the patient's annual risk of stroke is 7.2 %.  NOAC - 5mg  eliquis BID, appropriately dosed No bleeding concerns  #) HFimpEF Likely related to increased afib burden Warm and dry on exam No activity or exercise restrictions related to cardiac complaints.  She is limited by her right shoulder OA, bursitis  #) HTN At goal today.  Recommend checking blood pressures 1-2 times per week at home and recording the values. Continue hydrochlorothiazide 25 mg daily, Lopressor 12.5 mg twice daily, amlodipine 5 mg daily     Current medicines are reviewed at length with the patient today.   The patient does not have concerns regarding her medicines.  The following changes were made today:  none  Labs/ tests ordered today include:  Orders Placed This Encounter  Procedures   TSH   T4, free   Comp Met (CMET)   EKG 12-Lead     Disposition: Follow up with Dr. Lalla Brothers or EP APP in 6 months   Signed, Sherie Don, NP  08/18/22  10:15 AM  Electrophysiology [facility]

## 2022-08-18 ENCOUNTER — Ambulatory Visit: Payer: Medicare PPO | Attending: Cardiology | Admitting: Cardiology

## 2022-08-18 ENCOUNTER — Encounter: Payer: Self-pay | Admitting: Cardiology

## 2022-08-18 ENCOUNTER — Other Ambulatory Visit
Admission: RE | Admit: 2022-08-18 | Discharge: 2022-08-18 | Disposition: A | Discharge status: Home / Self Care | Payer: Medicare PPO | Source: Ambulatory Visit | Attending: Cardiology | Admitting: Cardiology

## 2022-08-18 VITALS — BP 126/72 | HR 57 | Ht 63.0 in | Wt 168.0 lb

## 2022-08-18 DIAGNOSIS — D6869 Other thrombophilia: Secondary | ICD-10-CM

## 2022-08-18 DIAGNOSIS — I4819 Other persistent atrial fibrillation: Secondary | ICD-10-CM | POA: Diagnosis not present

## 2022-08-18 DIAGNOSIS — I10 Essential (primary) hypertension: Secondary | ICD-10-CM | POA: Insufficient documentation

## 2022-08-18 DIAGNOSIS — Z79899 Other long term (current) drug therapy: Secondary | ICD-10-CM

## 2022-08-18 LAB — COMPREHENSIVE METABOLIC PANEL
ALT: 29 U/L (ref 0–44)
AST: 28 U/L (ref 15–41)
Albumin: 4 g/dL (ref 3.5–5.0)
Alkaline Phosphatase: 51 U/L (ref 38–126)
Anion gap: 10 (ref 5–15)
BUN: 25 mg/dL — ABNORMAL HIGH (ref 8–23)
CO2: 27 mmol/L (ref 22–32)
Calcium: 10 mg/dL (ref 8.9–10.3)
Chloride: 100 mmol/L (ref 98–111)
Creatinine, Ser: 1.34 mg/dL — ABNORMAL HIGH (ref 0.44–1.00)
GFR, Estimated: 41 mL/min — ABNORMAL LOW (ref >60)
Glucose, Bld: 96 mg/dL (ref 70–99)
Potassium: 4.2 mmol/L (ref 3.5–5.1)
Sodium: 137 mmol/L (ref 135–145)
Total Bilirubin: 1.2 mg/dL (ref 0.3–1.2)
Total Protein: 6.8 g/dL (ref 6.5–8.1)

## 2022-08-18 LAB — TSH: TSH: 0.064 u[IU]/mL — ABNORMAL LOW (ref 0.350–4.500)

## 2022-08-18 LAB — T4, FREE: Free T4: 1.37 ng/dL — ABNORMAL HIGH (ref 0.61–1.12)

## 2022-08-18 NOTE — Patient Instructions (Signed)
Medication Instructions:  Your physician recommends that you continue on your current medications as directed. Please refer to the Current Medication list given to you today.  *If you need a refill on your cardiac medications before your next appointment, please call your pharmacy*   Lab Work: CMP, TSH, and free T4 - Please go to the [facility]. You will check in at the front desk to the right as you walk into the atrium. Valet Parking is offered if needed. - No appointment needed. You may go any day between 7 am and 6 pm.  If you have labs (blood work) drawn today and your tests are completely normal, you will receive your results only by: MyChart Message (if you have MyChart) OR A paper copy in the mail If you have any lab test that is abnormal or we need to change your treatment, we will call you to review the results.   Testing/Procedures: No testing ordered  Follow-Up: At [facility], you and your health needs are our priority.  As part of our continuing mission to provide you with exceptional heart care, we have created designated Provider Care Teams.  These Care Teams include your primary Cardiologist (physician) and Advanced Practice Providers (APPs -  Physician Assistants and Nurse Practitioners) who all work together to provide you with the care you need, when you need it.  We recommend signing up for the patient portal called "MyChart".  Sign up information is provided on this After Visit Summary.  MyChart is used to connect with patients for Virtual Visits (Telemedicine).  Patients are able to view lab/test results, encounter notes, upcoming appointments, etc.  Non-urgent messages can be sent to your provider as well.   To learn more about what you can do with MyChart, go to [email or web address].    Your next appointment:   6 month(s)  Provider:   Sherie Don, NP

## 2022-08-19 ENCOUNTER — Ambulatory Visit (INDEPENDENT_AMBULATORY_CARE_PROVIDER_SITE_OTHER): Payer: Medicare PPO | Admitting: Nurse Practitioner

## 2022-08-19 ENCOUNTER — Encounter: Payer: Self-pay | Admitting: Nurse Practitioner

## 2022-08-19 VITALS — BP 120/82 | HR 70 | Temp 96.7°F | Ht 63.0 in | Wt 169.0 lb

## 2022-08-19 DIAGNOSIS — R946 Abnormal results of thyroid function studies: Secondary | ICD-10-CM

## 2022-08-19 NOTE — Patient Instructions (Signed)
Nice to see you today Call and get the ultrasound scheduled I want to recheck the thyroid number in 6 weeks. We will schedule an office visit and can swithc it to just lab if Ultrasound is normal

## 2022-08-19 NOTE — Assessment & Plan Note (Signed)
Patient is had 2 abnormal thyroid functions with abnormal T4.  On second draw they are improving.  Patient has a history of amiodarone use was discontinued due to thyroid.  Patient has normal thyroid functions previously will do an ultrasound of thyroid repeat in 6 weeks to see if thyroid function is improving if not consider treatment for hyperthyroidism patient is asymptomatic currently

## 2022-08-19 NOTE — Progress Notes (Signed)
   Established Patient Office Visit  Subjective   Patient ID: Monica Lindsey, female    DOB: 03/05/1945  Age: 77 y.o. MRN: [id number]  Chief Complaint  Patient presents with   Atrial Fibrillation    Had some questions about medication and thyroid       Lab review: Patient is currently followed by cardiology and electrophysiology.  Patient underwent ablation that was successful and placed on amiodarone postprocedure.  Patient had follow-up labs of thyroid which indicated hyperthyroidism and amiodarone was stopped.  Patient had a follow-up 3 months later which was yesterday patient's TSH is improving but T4 is still elevated with a decreased TSH.  Patient is here to discuss further. Patient is not having any symptoms of thyroid disease. She has had a history of normal thyroid functions    Review of Systems  Constitutional:  Negative for chills and fever.  Respiratory:  Negative for shortness of breath.   Cardiovascular:  Negative for chest pain and palpitations.  Neurological:  Negative for dizziness and headaches.  Psychiatric/Behavioral:  The patient does not have insomnia.       Objective:     BP 120/82   Pulse 70   Temp (!) 96.7 F (35.9 C) (Temporal)   Ht 5\' 3"  (1.6 m)   Wt 169 lb (76.7 kg)   SpO2 98%   BMI 29.94 kg/m  BP Readings from Last 3 Encounters:  08/19/22 120/82  08/18/22 126/72  07/23/22 118/72   Wt Readings from Last 3 Encounters:  08/19/22 169 lb (76.7 kg)  08/18/22 168 lb (76.2 kg)  08/04/22 165 lb 8 oz (75.1 kg)      Physical Exam Vitals and nursing note reviewed.  Constitutional:      Appearance: Normal appearance.  Neck:     Thyroid: No thyroid mass, thyromegaly or thyroid tenderness.  Cardiovascular:     Rate and Rhythm: Normal rate and regular rhythm.     Heart sounds: Normal heart sounds.  Pulmonary:     Effort: Pulmonary effort is normal.     Breath sounds: Normal breath sounds.  Lymphadenopathy:     Cervical: No cervical  adenopathy.  Neurological:     Mental Status: She is alert.      No results found for any visits on 08/19/22.    The 10-year ASCVD risk score (Arnett DK, et al., 2019) is: 25.5%    Assessment & Plan:   Problem List Items Addressed This Visit       Other   Abnormal thyroid function test - Primary    Patient is had 2 abnormal thyroid functions with abnormal T4.  On second draw they are improving.  Patient has a history of amiodarone use was discontinued due to thyroid.  Patient has normal thyroid functions previously will do an ultrasound of thyroid repeat in 6 weeks to see if thyroid function is improving if not consider treatment for hyperthyroidism patient is asymptomatic currently      Relevant Orders   US THYROID    Return in about 6 weeks (around 09/30/2022) for Thyroid recheck .    Audria Nine, NP

## 2022-09-02 ENCOUNTER — Encounter (INDEPENDENT_AMBULATORY_CARE_PROVIDER_SITE_OTHER): Payer: Self-pay

## 2022-09-07 ENCOUNTER — Ambulatory Visit
Admission: RE | Admit: 2022-09-07 | Discharge: 2022-09-07 | Disposition: A | Discharge status: Home / Self Care | Payer: Medicare PPO | Source: Ambulatory Visit | Attending: Nurse Practitioner | Admitting: Nurse Practitioner

## 2022-09-07 DIAGNOSIS — R946 Abnormal results of thyroid function studies: Secondary | ICD-10-CM | POA: Diagnosis not present

## 2022-09-07 DIAGNOSIS — E042 Nontoxic multinodular goiter: Secondary | ICD-10-CM | POA: Diagnosis not present

## 2022-09-08 ENCOUNTER — Telehealth: Payer: Self-pay | Admitting: Nurse Practitioner

## 2022-09-08 DIAGNOSIS — E041 Nontoxic single thyroid nodule: Secondary | ICD-10-CM

## 2022-09-08 NOTE — Telephone Encounter (Signed)
Order for FNA placed

## 2022-09-08 NOTE — Telephone Encounter (Signed)
-----   Message from [facility] T sent at 09/08/2022 12:42 PM EDT ----- Called patient reviewed all information and repeated back to me. Pt requested to have biopsy done at [place] location. No further questions or concerns.

## 2022-09-09 ENCOUNTER — Encounter: Payer: Self-pay | Admitting: *Deleted

## 2022-09-21 ENCOUNTER — Other Ambulatory Visit: Payer: Self-pay | Admitting: Nurse Practitioner

## 2022-09-21 DIAGNOSIS — Z13820 Encounter for screening for osteoporosis: Secondary | ICD-10-CM

## 2022-09-24 ENCOUNTER — Telehealth: Payer: Self-pay | Admitting: Nurse Practitioner

## 2022-09-24 NOTE — Telephone Encounter (Signed)
Can we call and see if she has called and setup the biopsy yet?

## 2022-09-24 NOTE — Telephone Encounter (Signed)
Contacted pt to ask if she has set up biopsy appointment. Pt stated that the office contacted her today and is scheduled for biopsy on Monday at 2pm.

## 2022-09-25 NOTE — Progress Notes (Signed)
Patient for Monica Lindsey guided FNA Mid RT & Inferior LT Thyroid Nodules Biopsy on Monday 09/28/22, I called and spoke with the patient on the phone and gave pre-procedure instructions. Pt was made aware to be here at 2p and check in at the [facility]. Pt stated understanding.  Called 09/25/2022

## 2022-09-28 ENCOUNTER — Ambulatory Visit
Admission: RE | Admit: 2022-09-28 | Discharge: 2022-09-28 | Disposition: A | Discharge status: Home / Self Care | Payer: Medicare PPO | Source: Ambulatory Visit | Attending: Nurse Practitioner | Admitting: Nurse Practitioner

## 2022-09-28 DIAGNOSIS — E042 Nontoxic multinodular goiter: Secondary | ICD-10-CM | POA: Diagnosis not present

## 2022-09-28 DIAGNOSIS — E041 Nontoxic single thyroid nodule: Secondary | ICD-10-CM | POA: Insufficient documentation

## 2022-09-28 MED ORDER — LIDOCAINE HCL (PF) 1 % IJ SOLN
5.0000 mL | Freq: Once | INTRAMUSCULAR | Status: AC
  Administered 2022-09-28: 5 mL via INTRADERMAL
  Filled 2022-09-28: qty 5

## 2022-09-28 NOTE — Discharge Instructions (Signed)
Discharge instructions reviewed with patient.

## 2022-09-28 NOTE — Procedures (Signed)
PROCEDURE SUMMARY:  Using direct ultrasound guidance, 5 passes were made using 25 g needles into the nodule within the right mid lobe of the thyroid.   Using direct ultrasound guidance, 5 passes were made using 25 g needles into the nodule within the left inferior lobe of the thyroid.   Ultrasound was used to confirm needle placements on all occasions.   EBL = trace  Specimens were sent to Pathology for analysis.  See procedure note under Imaging tab in Epic for full procedure details.  Kennieth Francois PA-C 09/28/2022 3:17 PM

## 2022-09-30 ENCOUNTER — Ambulatory Visit (INDEPENDENT_AMBULATORY_CARE_PROVIDER_SITE_OTHER): Payer: Medicare PPO | Admitting: Nurse Practitioner

## 2022-09-30 ENCOUNTER — Encounter: Payer: Self-pay | Admitting: Nurse Practitioner

## 2022-09-30 VITALS — BP 122/78 | HR 64 | Temp 98.0°F | Ht 63.0 in | Wt 169.8 lb

## 2022-09-30 DIAGNOSIS — R946 Abnormal results of thyroid function studies: Secondary | ICD-10-CM

## 2022-09-30 LAB — T4, FREE: Free T4: 1.09 ng/dL (ref 0.60–1.60)

## 2022-09-30 LAB — TSH: TSH: 0.31 u[IU]/mL — ABNORMAL LOW (ref 0.35–5.50)

## 2022-09-30 NOTE — Progress Notes (Signed)
   Established Patient Office Visit  Subjective   Patient ID: Monica Lindsey, female    DOB: 1945/10/03  Age: 77 y.o. MRN: [id number]  Chief Complaint  Patient presents with   Follow-up    Pt Complains that she is not sure why she is having thyroid. Pt states she feels good most days.     HPI   Abnormal TSH: patinet was seen by caridology and placed on amioderaone and had a change in tsh and was discontinued. She had a recheck and her thyorid function had improved but not normalized. She was seen by me and a [place] was ordered. She had several nodules that met criteria for FNA. Patient recently had FNA done. The pathology was begning with bethesda I and II.  Shoulder: states that approx 8-10 years ago she went to a clinic about shoulder health. States that she does get injection on the right shoulder. States tht it is beinging  more freuqenlty. States that she does go to [facility] and gets injections. Use to be 6 months and now is approx 3 months staets that she cannot    Signal relief patch that does help    Review of Systems  Constitutional:  Negative for chills and fever.  Respiratory:  Negative for shortness of breath.   Cardiovascular:  Negative for chest pain, palpitations and leg swelling.  Neurological:  Negative for dizziness and headaches.      Objective:     BP 122/78   Pulse 64   Temp 98 F (36.7 C) (Temporal)   Ht 5\' 3"  (1.6 m)   Wt 169 lb 12.8 oz (77 kg)   SpO2 98%   BMI 30.08 kg/m  BP Readings from Last 3 Encounters:  09/30/22 122/78  09/28/22 (!) 149/78  08/19/22 120/82   Wt Readings from Last 3 Encounters:  09/30/22 169 lb 12.8 oz (77 kg)  08/19/22 169 lb (76.7 kg)  08/18/22 168 lb (76.2 kg)      Physical Exam Vitals and nursing note reviewed.  Constitutional:      Appearance: Normal appearance.  Neck:     Thyroid: No thyroid mass, thyromegaly or thyroid tenderness.  Cardiovascular:     Rate and Rhythm: Normal rate and regular rhythm.      Heart sounds: Normal heart sounds.  Pulmonary:     Effort: Pulmonary effort is normal.     Breath sounds: Normal breath sounds.  Lymphadenopathy:     Cervical: No cervical adenopathy.  Neurological:     Mental Status: She is alert.      No results found for any visits on 09/30/22.    The 10-year ASCVD risk score (Arnett DK, et al., 2019) is: 26.3%    Assessment & Plan:   Problem List Items Addressed This Visit       Other   Abnormal thyroid function test - Primary    History of the same.  Will recheck today if still abnormal consider referral to endocrinology.  Patient is currently asymptomatic.  Patient with ultrasound of thyroid that showed nodules patient underwent FNA of 2 nodules that came back negative.      Relevant Orders   TSH   T4, free    Return in about 2 months (around 11/30/2022) for CPE and Labs.    Audria Nine, NP

## 2022-09-30 NOTE — Assessment & Plan Note (Signed)
History of the same.  Will recheck today if still abnormal consider referral to endocrinology.  Patient is currently asymptomatic.  Patient with ultrasound of thyroid that showed nodules patient underwent FNA of 2 nodules that came back negative.

## 2022-09-30 NOTE — Patient Instructions (Signed)
Nice to see you today I will be in touch with the labs once I have reviewed them Follow up with me in 2-3 months for your physical and labs

## 2022-10-07 ENCOUNTER — Other Ambulatory Visit: Payer: Self-pay | Admitting: Nurse Practitioner

## 2022-10-07 DIAGNOSIS — R946 Abnormal results of thyroid function studies: Secondary | ICD-10-CM

## 2022-10-07 DIAGNOSIS — E041 Nontoxic single thyroid nodule: Secondary | ICD-10-CM

## 2022-10-20 ENCOUNTER — Other Ambulatory Visit: Payer: Self-pay | Admitting: Nurse Practitioner

## 2022-10-20 ENCOUNTER — Ambulatory Visit
Admission: RE | Admit: 2022-10-20 | Discharge: 2022-10-20 | Disposition: A | Discharge status: Home / Self Care | Payer: Medicare PPO | Source: Ambulatory Visit | Attending: Nurse Practitioner | Admitting: Nurse Practitioner

## 2022-10-20 DIAGNOSIS — Z13820 Encounter for screening for osteoporosis: Secondary | ICD-10-CM | POA: Insufficient documentation

## 2022-10-20 DIAGNOSIS — M8589 Other specified disorders of bone density and structure, multiple sites: Secondary | ICD-10-CM | POA: Diagnosis not present

## 2022-10-20 DIAGNOSIS — Z780 Asymptomatic menopausal state: Secondary | ICD-10-CM | POA: Insufficient documentation

## 2022-10-20 DIAGNOSIS — I10 Essential (primary) hypertension: Secondary | ICD-10-CM

## 2022-10-23 ENCOUNTER — Ambulatory Visit: Payer: Medicare PPO | Admitting: Cardiology

## 2022-11-03 DIAGNOSIS — M25511 Pain in right shoulder: Secondary | ICD-10-CM | POA: Diagnosis not present

## 2022-11-03 DIAGNOSIS — M13811 Other specified arthritis, right shoulder: Secondary | ICD-10-CM | POA: Diagnosis not present

## 2022-11-05 DIAGNOSIS — M25511 Pain in right shoulder: Secondary | ICD-10-CM | POA: Diagnosis not present

## 2022-11-11 ENCOUNTER — Telehealth: Payer: Self-pay | Admitting: Nurse Practitioner

## 2022-11-11 ENCOUNTER — Other Ambulatory Visit: Payer: Self-pay | Admitting: Nurse Practitioner

## 2022-11-11 DIAGNOSIS — I10 Essential (primary) hypertension: Secondary | ICD-10-CM

## 2022-11-11 DIAGNOSIS — I4891 Unspecified atrial fibrillation: Secondary | ICD-10-CM

## 2022-11-11 MED ORDER — APIXABAN 5 MG PO TABS
5.0000 mg | ORAL_TABLET | Freq: Two times a day (BID) | ORAL | 5 refills | Status: DC
Start: 2022-11-11 — End: 2023-03-01

## 2022-11-11 NOTE — Telephone Encounter (Signed)
Refill provided

## 2022-11-11 NOTE — Telephone Encounter (Signed)
Prescription Request  11/11/2022  LOV: 09/30/2022  What is the name of the medication or equipment? apixaban (ELIQUIS) 5 MG TABS tablet   Have you contacted your pharmacy to request a refill? No   Which pharmacy would you like this sent to?  [id number] [place],  - [address] AT [facility] OF [address] [place] [address] Phone: [phone] Fax: [phone]    Patient notified that their request is being sent to the clinical staff for review and that they should receive a response within 2 business days.   Please advise at Mobile [phone] (mobile)

## 2022-11-11 NOTE — Telephone Encounter (Signed)
LAST APPOINTMENT DATE: 09/30/22   NEXT APPOINTMENT DATE: 08/09/23   Eliquis 5mg    LAST REFILL: 04/27/22   QTY: #[id number]

## 2022-11-16 NOTE — Telephone Encounter (Signed)
Patient called in and stated that the pharmacy doesn't have a refill on file. She is needing the medication because she is completely out.

## 2022-11-18 ENCOUNTER — Encounter: Payer: Self-pay | Admitting: Nurse Practitioner

## 2022-11-18 NOTE — Telephone Encounter (Signed)
Patient called in to follow up on this refill. She stated that she is completely out of this medication and needs a refill. She stated that her bottle states no refills. Please advise. Thank you!

## 2022-11-19 ENCOUNTER — Other Ambulatory Visit: Payer: Self-pay | Admitting: Nurse Practitioner

## 2022-11-19 DIAGNOSIS — I10 Essential (primary) hypertension: Secondary | ICD-10-CM

## 2022-11-20 NOTE — Telephone Encounter (Signed)
I sent this in last month for 90 days with a refill can we see if she has gotten it? It was sent to the [facility]

## 2022-11-23 NOTE — Telephone Encounter (Signed)
Called pharmacy she picked up 90 day on 11/19/22. Has one refill on file.

## 2022-11-23 NOTE — Telephone Encounter (Addendum)
Left voicemail for patient to call the office back.   Sent mychart message as well.

## 2022-12-07 ENCOUNTER — Other Ambulatory Visit (INDEPENDENT_AMBULATORY_CARE_PROVIDER_SITE_OTHER): Payer: Medicare PPO

## 2022-12-07 ENCOUNTER — Ambulatory Visit (INDEPENDENT_AMBULATORY_CARE_PROVIDER_SITE_OTHER): Payer: Medicare PPO | Admitting: Nurse Practitioner

## 2022-12-07 ENCOUNTER — Encounter: Payer: Self-pay | Admitting: Nurse Practitioner

## 2022-12-07 VITALS — BP 110/88 | HR 120 | Temp 97.7°F | Ht 63.0 in | Wt 175.2 lb

## 2022-12-07 DIAGNOSIS — R946 Abnormal results of thyroid function studies: Secondary | ICD-10-CM | POA: Diagnosis not present

## 2022-12-07 DIAGNOSIS — R000 Tachycardia, unspecified: Secondary | ICD-10-CM | POA: Insufficient documentation

## 2022-12-07 DIAGNOSIS — I4819 Other persistent atrial fibrillation: Secondary | ICD-10-CM

## 2022-12-07 DIAGNOSIS — J3489 Other specified disorders of nose and nasal sinuses: Secondary | ICD-10-CM | POA: Diagnosis not present

## 2022-12-07 DIAGNOSIS — H6993 Unspecified Eustachian tube disorder, bilateral: Secondary | ICD-10-CM | POA: Diagnosis not present

## 2022-12-07 DIAGNOSIS — K5900 Constipation, unspecified: Secondary | ICD-10-CM | POA: Diagnosis not present

## 2022-12-07 DIAGNOSIS — H938X9 Other specified disorders of ear, unspecified ear: Secondary | ICD-10-CM | POA: Insufficient documentation

## 2022-12-07 DIAGNOSIS — E041 Nontoxic single thyroid nodule: Secondary | ICD-10-CM | POA: Diagnosis not present

## 2022-12-07 LAB — POC COVID19 BINAXNOW: SARS Coronavirus 2 Ag: NEGATIVE

## 2022-12-07 MED ORDER — FLUTICASONE PROPIONATE 50 MCG/ACT NA SUSP
2.0000 | Freq: Every day | NASAL | 0 refills | Status: AC
Start: 2022-12-07 — End: ?

## 2022-12-07 MED ORDER — POLYETHYLENE GLYCOL 3350 17 G PO PACK
17.0000 g | PACK | Freq: Every day | ORAL | 0 refills | Status: AC
Start: 2022-12-07 — End: ?

## 2022-12-07 NOTE — Progress Notes (Signed)
Acute Office Visit  Subjective:     Patient ID: Monica Lindsey, female    DOB: [birth year], 77 y.o.   MRN: [id number]  Chief Complaint  Patient presents with   sinus drainage    Pt complains of drainage, clear mucus, no fevers. States that both ears are stuffed up, feels like pressure. Pt also complains of constipation around the same time as other symptoms.     HPI Patient is in today for multiple complaints with a history of Afib, HTN, HLD, abnormal thyroid function  Sick symptoms: symptoms started on Sunday.  Covid vaccine: UTD Flu vaccine: not yet Sick contacts: none OTC treatment: states a tiny white pill that helped some.  States that she used some hydrogen peroxide in there right ear on Friday. States that she had some relief   Constipation: states that it is an ongoing problem. States that about a month ago she had a large hard bowel mvemnt. States that she ha another one yesterday. States that she does eat plenty of produce. States that she has tired stool softeners, laxatives and miralax in the past. States that this past time she did do fleet suppositories. States that she has a supplement that she is taking BID. States that she had a small bowel movement.    Review of Systems  Constitutional:  Positive for chills and malaise/fatigue. Negative for fever.  HENT:  Positive for ear pain. Negative for ear discharge and sore throat.   Respiratory:  Negative for shortness of breath.   Cardiovascular:  Negative for chest pain.  Gastrointestinal:  Positive for constipation. Negative for abdominal pain, diarrhea, nausea and vomiting.  Neurological:  Negative for headaches.        Objective:    BP 110/88   Pulse (!) 120   Temp 97.7 F (36.5 C) (Oral)   Ht 5\' 3"  (1.6 m)   Wt 175 lb 3.2 oz (79.5 kg)   SpO2 94%   BMI 31.04 kg/m  BP Readings from Last 3 Encounters:  12/07/22 110/88  09/30/22 122/78  09/28/22 (!) 149/78   Wt Readings from Last 3 Encounters:   12/07/22 175 lb 3.2 oz (79.5 kg)  09/30/22 169 lb 12.8 oz (77 kg)  08/19/22 169 lb (76.7 kg)   SpO2 Readings from Last 3 Encounters:  12/07/22 94%  09/30/22 98%  09/28/22 98%      Physical Exam Vitals and nursing note reviewed.  Constitutional:      Appearance: Normal appearance.  HENT:     Right Ear: Tympanic membrane, ear canal and external ear normal.     Left Ear: Tympanic membrane, ear canal and external ear normal.     Nose:     Right Sinus: No maxillary sinus tenderness or frontal sinus tenderness.     Left Sinus: No maxillary sinus tenderness or frontal sinus tenderness.     Mouth/Throat:     Mouth: Mucous membranes are moist.     Pharynx: Oropharynx is clear.  Cardiovascular:     Rate and Rhythm: Regular rhythm. Tachycardia present.     Heart sounds: Normal heart sounds.  Pulmonary:     Effort: Pulmonary effort is normal.     Breath sounds: Normal breath sounds.  Lymphadenopathy:     Cervical: No cervical adenopathy.  Neurological:     Mental Status: She is alert.     No results found for any visits on 12/07/22.      Assessment & Plan:   Problem List Items  Addressed This Visit       Nervous and Auditory   Dysfunction of both eustachian tubes    History of same.  Will renew patient Flonase 2 sprays each nostril daily.      Relevant Medications   fluticasone (FLONASE) 50 MCG/ACT nasal spray     Other   Constipation    Patient has a history of same.  Will try MiraLAX daily      Relevant Medications   polyethylene glycol (MIRALAX / GLYCOLAX) 17 g packet   Nasal drainage    COVID test in office Flonase nasal spray.  COVID test was negative      Relevant Orders   POC COVID-19 BinaxNow   Tachycardia - Primary    Patient has a history of paroxysmal atrial fibrillation with RVR.  Patient's EKG within normal sinus but accelerated rate to tachycardic.  Patient took 2 white allergy pills earlier today query if it has a decongestant or something that  spitting of the heart.  Patient will check pulse at home if greater than 100 she will take 25 mg of metoprolol.  If less than 100 she will take her 12.5 mg metoprolol.  Patient will check her blood pressure in the morning and will reach out to see if it is stabilized.  Patient is asymptomatic in office.      Relevant Orders   EKG 12-Lead (Completed)   CBC   Comprehensive metabolic panel   Sensation of fullness in ear    COVID test in office.  It was negative.  Flonase as directed      Relevant Orders   POC COVID-19 BinaxNow    Meds ordered this encounter  Medications   fluticasone (FLONASE) 50 MCG/ACT nasal spray    Sig: Place 2 sprays into both nostrils daily.    Dispense:  16 g    Refill:  0    Order Specific Question:   Supervising Provider    Answer:   TOWER, MARNE A [[id number]]   polyethylene glycol (MIRALAX / GLYCOLAX) 17 g packet    Sig: Take 17 g by mouth daily.    Dispense:  30 each    Refill:  0    Order Specific Question:   Supervising Provider    Answer:   Milinda Antis, MARNE A [[id number]]    Return if symptoms worsen or fail to improve.  Audria Nine, NP

## 2022-12-07 NOTE — Assessment & Plan Note (Signed)
History of same.  Will renew patient Flonase 2 sprays each nostril daily.

## 2022-12-07 NOTE — Assessment & Plan Note (Signed)
Patient has a history of same.  Will try MiraLAX daily

## 2022-12-07 NOTE — Assessment & Plan Note (Signed)
COVID test in office Flonase nasal spray.  COVID test was negative

## 2022-12-07 NOTE — Patient Instructions (Addendum)
Nice to see you today Call the office back with the exact name of the medication that you took earlier today You can take a whole tablet of the metoprolol tonight (25MG ) if your pulse is over 100. If it is below 100 just take the half tablet.  Check your pulse in the morning and let me know what it is

## 2022-12-07 NOTE — Assessment & Plan Note (Signed)
Patient has a history of paroxysmal atrial fibrillation with RVR.  Patient's EKG within normal sinus but accelerated rate to tachycardic.  Patient took 2 white allergy pills earlier today query if it has a decongestant or something that spitting of the heart.  Patient will check pulse at home if greater than 100 she will take 25 mg of metoprolol.  If less than 100 she will take her 12.5 mg metoprolol.  Patient will check her blood pressure in the morning and will reach out to see if it is stabilized.  Patient is asymptomatic in office.

## 2022-12-07 NOTE — Assessment & Plan Note (Signed)
COVID test in office.  It was negative.  Flonase as directed

## 2022-12-08 ENCOUNTER — Telehealth: Payer: Self-pay

## 2022-12-08 LAB — COMPREHENSIVE METABOLIC PANEL
ALT: 20 U/L (ref 0–35)
AST: 20 U/L (ref 0–37)
Albumin: 4.2 g/dL (ref 3.5–5.2)
Alkaline Phosphatase: 54 U/L (ref 39–117)
BUN: 25 mg/dL — ABNORMAL HIGH (ref 6–23)
CO2: 28 meq/L (ref 19–32)
Calcium: 10.5 mg/dL (ref 8.4–10.5)
Chloride: 99 meq/L (ref 96–112)
Creatinine, Ser: 1.51 mg/dL — ABNORMAL HIGH (ref 0.40–1.20)
GFR: 32.79 mL/min — ABNORMAL LOW (ref >60.00)
Glucose, Bld: 90 mg/dL (ref 70–99)
Potassium: 4.2 meq/L (ref 3.5–5.1)
Sodium: 138 meq/L (ref 135–145)
Total Bilirubin: 0.7 mg/dL (ref 0.2–1.2)
Total Protein: 6.7 g/dL (ref 6.0–8.3)

## 2022-12-08 LAB — CBC
HCT: 43.3 % (ref 36.0–46.0)
Hemoglobin: 14.4 g/dL (ref 12.0–15.0)
MCHC: 33.2 g/dL (ref 30.0–36.0)
MCV: 93.9 fL (ref 78.0–100.0)
Platelets: 264 10*3/uL (ref 150.0–400.0)
RBC: 4.61 Mil/uL (ref 3.87–5.11)
RDW: 13.7 % (ref 11.5–15.5)
WBC: 6.6 10*3/uL (ref 4.0–10.5)

## 2022-12-08 LAB — T4, FREE: Free T4: 1.12 ng/dL (ref 0.60–1.60)

## 2022-12-08 LAB — TSH: TSH: 1.25 u[IU]/mL (ref 0.35–5.50)

## 2022-12-08 NOTE — Telephone Encounter (Signed)
Left v/m requesting pt to cb [phone]. I spoke with pt.pt said on 12/07/22 at 10:15 pm BP was 110/82 P 116  pt took metoprolol 25 mg. 12/07/22 10:30pm BP 118/89 P 120. Pt fell asleep and did not take BP until 12/08/22 at 8 AM; BP 134/113 P76 this BP was after taking trash to daughters and going by to vote but line was too long so pt went back home. On 12/08/22 at 8:35 AM BP 116/78 P 76. Pt has just eaten breakfast and is going to take her morning meds. Pt will take metoprolol 12.5 mg this morning. Pt said she feels tired but no pain or dizziness. Sending note to [facility] cable NP and pt request cb after note reviewed by [facility].

## 2022-12-08 NOTE — Telephone Encounter (Signed)
Noted I agree with her doing the original dose of metoprolol which is 12.5 mg each dose.

## 2022-12-08 NOTE — Telephone Encounter (Signed)
Per DPR left detailed v/m for pt with Audria Nine NP instructions of taking the original dose of metoprolol 12.5 mg each dose. Pt to cb if any questions or concerns. Sending FYI to Audria Nine NP.Marland Kitchen

## 2022-12-16 ENCOUNTER — Encounter: Payer: Self-pay | Admitting: Nurse Practitioner

## 2023-01-12 ENCOUNTER — Telehealth: Payer: Self-pay | Admitting: Cardiology

## 2023-01-12 DIAGNOSIS — I4891 Unspecified atrial fibrillation: Secondary | ICD-10-CM

## 2023-01-12 MED ORDER — METOPROLOL TARTRATE 25 MG PO TABS
12.5000 mg | ORAL_TABLET | Freq: Two times a day (BID) | ORAL | 1 refills | Status: DC
Start: 2023-01-12 — End: 2023-03-02

## 2023-01-12 NOTE — Telephone Encounter (Signed)
*  STAT* If patient is at the pharmacy, call can be transferred to refill team.   1. Which medications need to be refilled? (please list name of each medication and dose if known)   metoprolol tartrate (LOPRESSOR) 25 MG tablet     2. Would you like to learn more about the convenience, safety, & potential cost savings by using the [facility]? No   3. Are you open to using the [facility] (Type [facility]. ) No   4. Which pharmacy/location (including street and city if local pharmacy) is medication to be sent to? [id number] - [place], [place] - [address] AT [facility] OF [address]    5. Do they need a 30 day or 90 day supply? 90 day  Pt is out of medication

## 2023-01-22 ENCOUNTER — Encounter: Payer: Self-pay | Admitting: Cardiology

## 2023-01-22 ENCOUNTER — Ambulatory Visit: Payer: Medicare PPO | Attending: Cardiology | Admitting: Cardiology

## 2023-01-22 VITALS — BP 118/76 | HR 81 | Ht 63.0 in | Wt 173.4 lb

## 2023-01-22 DIAGNOSIS — I10 Essential (primary) hypertension: Secondary | ICD-10-CM | POA: Diagnosis not present

## 2023-01-22 DIAGNOSIS — I429 Cardiomyopathy, unspecified: Secondary | ICD-10-CM | POA: Diagnosis not present

## 2023-01-22 DIAGNOSIS — I4819 Other persistent atrial fibrillation: Secondary | ICD-10-CM | POA: Diagnosis not present

## 2023-01-22 NOTE — H&P (View-Only) (Signed)
Cardiology Office Note:    Date:  01/22/2023   ID:  Monica Lindsey, DOB 21-Sep-1945, MRN [id number]  PCP:  Eden Emms, NP   [facility] Providers Cardiologist:  Debbe Odea, MD Electrophysiologist:  Lanier Prude, MD     Referring MD: Eden Emms, NP   Chief Complaint  Patient presents with   Follow-up    Patient reports intermittent mid left and right side chest pain with occasional mid back pain with last episode 2 nights ago.      History of Present Illness:    Monica Lindsey is a 77 y.o. female with a hx of persistent atrial fibrillation s/p DC cardioversion 3/24, hypertension, hyperlipidemia presents for follow-up.  Amiodarone was previously stopped due to thyroid dysfunction.  She is compliant with Eliquis as prescribed, denies any palpitations, denies any bleeding issues.  She endorses fatigue and sleepiness ongoing over the past 3 to 4 weeks.  No dizziness or syncope.  Prior notes Echo 08/2022 EF 50 to 55% Echo 03/2022 EF 35 to 40% Coronary calcium score 02/2022, calcium score 0  Past Medical History:  Diagnosis Date   Arthritis    Constipation    Frequent headaches    Hay fever    Heel spur    Hyperlipidemia    Hypertension     Past Surgical History:  Procedure Laterality Date   ABDOMINAL HYSTERECTOMY     parialt ovaries intact   BACK SURGERY  02/02/1981   CARDIOVERSION N/A 03/04/2022   Procedure: CARDIOVERSION;  Surgeon: Debbe Odea, MD;  Location: [facility] ORS;  Service: Cardiovascular;  Laterality: N/A;   CARDIOVERSION N/A 04/08/2022   Procedure: CARDIOVERSION;  Surgeon: Debbe Odea, MD;  Location: [facility] ORS;  Service: Cardiovascular;  Laterality: N/A;   CARPOMETACARPAL (CMC) FUSION OF THUMB Right 04/07/2017   Procedure: CARPOMETACARPAL (CMC) FUSION OF THUMB;  Surgeon: Deeann Saint, MD;  Location: [facility] ORS;  Service: Orthopedics;  Laterality: Right;   COLONOSCOPY     COLONOSCOPY WITH PROPOFOL N/A 09/10/2020    Procedure: COLONOSCOPY WITH PROPOFOL;  Surgeon: Midge Minium, MD;  Location: [facility] ENDOSCOPY;  Service: Endoscopy;  Laterality: N/A;   HEMORRHOID SURGERY     TOOTH EXTRACTION      Current Medications: Current Meds  Medication Sig   amLODipine (NORVASC) 5 MG tablet TAKE 1 TABLET DAILY.   apixaban (ELIQUIS) 5 MG TABS tablet Take 1 tablet (5 mg total) by mouth 2 (two) times daily.   fluticasone (FLONASE) 50 MCG/ACT nasal spray Place 2 sprays into both nostrils daily.   hydrochlorothiazide (HYDRODIURIL) 25 MG tablet TAKE 1 TABLET BY MOUTH DAILY   metoprolol tartrate (LOPRESSOR) 25 MG tablet Take 0.5 tablets (12.5 mg total) by mouth 2 (two) times daily.   Multiple Vitamin (MULTIVITAMIN) tablet Take 1 tablet by mouth daily.   polyethylene glycol (MIRALAX / GLYCOLAX) 17 g packet Take 17 g by mouth daily.   Vitamin D, Cholecalciferol, 25 MCG (1000 UT) TABS Take by mouth.     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   Widowed.      Still working 3 days per week.  Very close to her children and grand children.      Daughter, [name], is HPOA.  Has a living will.   Would desire CPR, would not desire prolonged life support if futile.            Social Drivers of Corporate investment banker Strain: Low Risk  (08/04/2022)   Overall Financial Resource Strain (CARDIA)    Difficulty of Paying Living Expenses:  Not hard at all  Food Insecurity: No Food Insecurity (08/04/2022)   Hunger Vital Sign    Worried About Running Out of Food in the Last Year: Never true    Ran Out of Food in the Last Year: Never true  Transportation Needs: No Transportation Needs (08/04/2022)   PRAPARE - Administrator, Civil Service (Medical): No    Lack of Transportation (Non-Medical): No  Physical Activity: Insufficiently Active (08/04/2022)   Exercise Vital Sign    Days of Exercise per Week: 3 days    Minutes of Exercise per Session: 10 min  Stress: No Stress Concern Present (08/04/2022)   [employer] of Occupational Health - Occupational Stress Questionnaire    Feeling of Stress : Not at all  Social Connections: Moderately Integrated (08/04/2022)   Social Connection and Isolation Panel [NHANES]    Frequency of Communication with Friends and Family: More than three times a week    Frequency of Social Gatherings with Friends and Family: Once a week    Attends Religious Services: More than 4 times per year    Active Member of [employer] or Organizations: Yes    Attends Banker Meetings: More than 4 times per year    Marital Status: Widowed     Family History: The patient's family history includes Alcohol abuse in her father; Arthritis in her father; Brain cancer in her sister; Breast cancer in her cousin; Breast cancer (age of onset: 73) in her paternal aunt; Heart disease in her father and mother; Hypertension in her father; Stroke in her father.  ROS:   Please see the history of present illness.     All other systems reviewed and are negative.  EKGs/Labs/Other Studies Reviewed:    The following studies were reviewed today:   EKG:  EKG is  ordered today.  The ekg ordered today demonstrates sinus rhythm, heart rate 60  Recent Labs: 12/07/2022: ALT 20; BUN 25; Creatinine, Ser 1.51; Hemoglobin 14.4; Platelets 264.0; Potassium 4.2; Sodium 138; TSH 1.25  Recent Lipid Panel    Component Value  Date/Time   CHOL 211 (H) 10/03/2021 0935   TRIG 121.0 10/03/2021 0935   HDL 59.60 10/03/2021 0935   CHOLHDL 4 10/03/2021 0935   VLDL 24.2 10/03/2021 0935   LDLCALC 127 (H) 10/03/2021 0935   LDLDIRECT 127.3 03/23/2013 1459     Risk Assessment/Calculations:             Physical Exam:    VS:  BP 118/76 (BP Location: Left Arm, Patient Position: Sitting, Cuff Size: Normal)   Pulse 81   Ht 5\' 3"  (1.6 m)   Wt 173 lb 6.4 oz (78.7 kg)   SpO2  98%   BMI 30.72 kg/m     Wt Readings from Last 3 Encounters:  01/22/23 173 lb 6.4 oz (78.7 kg)  12/07/22 175 lb 3.2 oz (79.5 kg)  09/30/22 169 lb 12.8 oz (77 kg)     GEN:  Well nourished, well developed in no acute distress HEENT: Normal NECK: No JVD; No carotid bruits CARDIAC: Regular rate and rhythm RESPIRATORY:  Clear to auscultation without rales, wheezing or rhonchi  ABDOMEN: Soft, non-tender, non-distended MUSCULOSKELETAL:  No edema; No deformity  SKIN: Warm and dry NEUROLOGIC:  Alert and oriented x 3 PSYCHIATRIC:  Normal affect   ASSESSMENT:    1. Persistent atrial fibrillation (HCC)   2. Primary hypertension   3. Cardiomyopathy, unspecified type (HCC)    PLAN:    In order of problems listed above:  Persistent A-fib/flutter s/p DC cardioversion 3/24.  EKG today showing atrial flutter, heart rate 81.  Amiodarone previously stopped due to thyroid dysfunction.  Last thyroid function showed normalized levels.  Endorses symptoms of fatigue.  Continue Lopressor 12.5 mg twice daily, Eliquis 5 mg twice daily.  Refer to EP for alternative AAD, ? tikosyn or ablation consideration.  Will plan DC cardioversion. Hypertension, BP controlled.  BP controlled.  Continue Lopressor, Norvasc, HCTZ.  Cardiomyopathy, initial echo 2/24 EF 35 to 40%, possibly tachycardia induced.  Repeat echocardiogram 7/24 EF 50 to 55%.  Management of A-fib as per EP clinic.?  Tikosyn.  Follow-up in 6 weeks.  Informed Consent   Shared Decision Making/Informed  Consent The risks (stroke, cardiac arrhythmias rarely resulting in the need for a temporary or permanent pacemaker, skin irritation or burns and complications associated with conscious sedation including aspiration, arrhythmia, respiratory failure and death), benefits (restoration of normal sinus rhythm) and alternatives of a direct current cardioversion were explained in detail to Ms. Boom and she agrees to proceed.           Medication Adjustments/Labs and Tests Ordered: Current medicines are reviewed at length with the patient today.  Concerns regarding medicines are outlined above.  Orders Placed This Encounter  Procedures   Basic Metabolic Panel (BMET)   CBC   EKG 12-Lead   No orders of the defined types were placed in this encounter.   Patient Instructions  Medication Instructions:   Your physician recommends that you continue on your current medications as directed. Please refer to the Current Medication list given to you today.  *If you need a refill on your cardiac medications before your next appointment, please call your pharmacy*   Lab Work:  Your physician recommends you have labs - CBC / BMP   If you have labs (blood work) drawn today and your tests are completely normal, you will receive your results only by: MyChart Message (if you have MyChart) OR A paper copy in the mail If you have any lab test that is abnormal or we need to change your treatment, we will call you to review the results.   Testing/Procedures:  You are scheduled for a Cardioversion on 01/25/2023 with Dr. Azucena Cecil.    Please arrive at the [facility] Entrance of [facility], [address], [place] [address] at 11:00 am (This is 1 hour prior to your procedure time).  Proceed to the Check-In Desk directly inside the entrance.  Procedure Parking: Use the entrance off of the [address] side of the hospital. Turn right upon entering and follow the driveway to parking that is  directly in front of the  Heart & Vascular Center. There is no valet parking available at this entrance, however there is an awning directly in front of the Heart & Vascular Center for drop off/ pick up for patients  DIET: Nothing to eat or drink after midnight except a sip of water with medications (see medication instructions below)  Medication Instructions: Hold hydrochlorothiazide  Continue your anticoagulant: Eliquis If you miss a dose, please call us at [phone] You will need to continue your anticoagulant after your procedure until you are told by your provider that it is safe to stop.   Labs: TODAY  FYI: For your safety, and to allow [place] to monitor your vital signs accurately during the surgery/procedure we request that if you have artificial nails, gel coating, SNS etc. Please have those removed prior to your surgery/procedure. Not having the nail coverings /polish removed may result in cancellation or delay of your surgery/procedure.  You must have a responsible person to drive you home and stay in the waiting area during your procedure. Failure to do so could result in cancellation.  Bring your insurance cards.  If you have any questions after you get home, please call the office at [phone]  *Special Note: Every effort is made to have your procedure done on time. Occasionally there are emergencies that occur at the hospital that may cause delays. Please be patient if a delay does occur.        Follow-Up: At [facility], you and your health needs are our priority.  As part of our continuing mission to provide you with exceptional heart care, we have created designated Provider Care Teams.  These Care Teams include your primary Cardiologist (physician) and Advanced Practice Providers (APPs -  Physician Assistants and Nurse Practitioners) who all work together to provide you with the care you need, when you need it.  We recommend signing up for the patient portal  called "MyChart".  Sign up information is provided on this After Visit Summary.  MyChart is used to connect with patients for Virtual Visits (Telemedicine).  Patients are able to view lab/test results, encounter notes, upcoming appointments, etc.  Non-urgent messages can be sent to your provider as well.   To learn more about what you can do with MyChart, go to [email or web address].    Your next appointment:  PLEASE SCHEDULE AN APPT WITH DR. Lalla Brothers 6 week(s)  Provider:   You may see Debbe Odea, MD or one of the following Advanced Practice Providers on your designated Care Team:   Nicolasa Ducking, NP Eula Listen, PA-C Cadence Fransico Michael, PA-C Charlsie Quest, NP Carlos Levering, NP    Signed, Debbe Odea, MD  01/22/2023 11:10 AM    [facility]

## 2023-01-22 NOTE — Progress Notes (Signed)
Cardiology Office Note:    Date:  01/22/2023   ID:  Monica Lindsey, DOB 21-Sep-1945, MRN [id number]  PCP:  Monica Emms, NP   [facility] Providers Cardiologist:  Monica Odea, MD Electrophysiologist:  Monica Prude, MD     Referring MD: Monica Emms, NP   Chief Complaint  Patient presents with   Follow-up    Patient reports intermittent mid left and right side chest pain with occasional mid back pain with last episode 2 nights ago.      History of Present Illness:    Monica Lindsey is a 77 y.o. female with a hx of persistent atrial fibrillation s/p DC cardioversion 3/24, hypertension, hyperlipidemia presents for follow-up.  Amiodarone was previously stopped due to thyroid dysfunction.  She is compliant with Eliquis as prescribed, denies any palpitations, denies any bleeding issues.  She endorses fatigue and sleepiness ongoing over the past 3 to 4 weeks.  No dizziness or syncope.  Prior notes Echo 08/2022 EF 50 to 55% Echo 03/2022 EF 35 to 40% Coronary calcium score 02/2022, calcium score 0  Past Medical History:  Diagnosis Date   Arthritis    Constipation    Frequent headaches    Hay fever    Heel spur    Hyperlipidemia    Hypertension     Past Surgical History:  Procedure Laterality Date   ABDOMINAL HYSTERECTOMY     parialt ovaries intact   BACK SURGERY  02/02/1981   CARDIOVERSION N/A 03/04/2022   Procedure: CARDIOVERSION;  Surgeon: Monica Odea, MD;  Location: [facility] ORS;  Service: Cardiovascular;  Laterality: N/A;   CARDIOVERSION N/A 04/08/2022   Procedure: CARDIOVERSION;  Surgeon: Monica Odea, MD;  Location: [facility] ORS;  Service: Cardiovascular;  Laterality: N/A;   CARPOMETACARPAL (CMC) FUSION OF THUMB Right 04/07/2017   Procedure: CARPOMETACARPAL (CMC) FUSION OF THUMB;  Surgeon: Monica Saint, MD;  Location: [facility] ORS;  Service: Orthopedics;  Laterality: Right;   COLONOSCOPY     COLONOSCOPY WITH PROPOFOL N/A 09/10/2020    Procedure: COLONOSCOPY WITH PROPOFOL;  Surgeon: Monica Minium, MD;  Location: [facility] ENDOSCOPY;  Service: Endoscopy;  Laterality: N/A;   HEMORRHOID SURGERY     TOOTH EXTRACTION      Current Medications: Current Meds  Medication Sig   amLODipine (NORVASC) 5 MG tablet TAKE 1 TABLET DAILY.   apixaban (ELIQUIS) 5 MG TABS tablet Take 1 tablet (5 mg total) by mouth 2 (two) times daily.   fluticasone (FLONASE) 50 MCG/ACT nasal spray Place 2 sprays into both nostrils daily.   hydrochlorothiazide (HYDRODIURIL) 25 MG tablet TAKE 1 TABLET BY MOUTH DAILY   metoprolol tartrate (LOPRESSOR) 25 MG tablet Take 0.5 tablets (12.5 mg total) by mouth 2 (two) times daily.   Multiple Vitamin (MULTIVITAMIN) tablet Take 1 tablet by mouth daily.   polyethylene glycol (MIRALAX / GLYCOLAX) 17 g packet Take 17 g by mouth daily.   Vitamin D, Cholecalciferol, 25 MCG (1000 UT) TABS Take by mouth.     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   Widowed.      Still working 3 days per week.  Very close to her children and grand children.      Daughter, Monica Lindsey, is HPOA.  Has a living will.   Would desire CPR, would not desire prolonged life support if futile.            Social Drivers of Corporate investment banker Strain: Low Risk  (08/04/2022)   Overall Financial Resource Strain (CARDIA)    Difficulty of Paying Living Expenses:  Not hard at all  Food Insecurity: No Food Insecurity (08/04/2022)   Hunger Vital Sign    Worried About Running Out of Food in the Last Year: Never true    Ran Out of Food in the Last Year: Never true  Transportation Needs: No Transportation Needs (08/04/2022)   PRAPARE - Administrator, Civil Service (Medical): No    Lack of Transportation (Non-Medical): No  Physical Activity: Insufficiently Active (08/04/2022)   Exercise Vital Sign    Days of Exercise per Week: 3 days    Minutes of Exercise per Session: 10 min  Stress: No Stress Concern Present (08/04/2022)   [employer] of Occupational Health - Occupational Stress Questionnaire    Feeling of Stress : Not at all  Social Connections: Moderately Integrated (08/04/2022)   Social Connection and Isolation Panel [NHANES]    Frequency of Communication with Friends and Family: More than three times a week    Frequency of Social Gatherings with Friends and Family: Once a week    Attends Religious Services: More than 4 times per year    Active Member of [employer] or Organizations: Yes    Attends Banker Meetings: More than 4 times per year    Marital Status: Widowed     Family History: The patient's family history includes Alcohol abuse in her father; Arthritis in her father; Brain cancer in her sister; Breast cancer in her cousin; Breast cancer (age of onset: 73) in her paternal aunt; Heart disease in her father and mother; Hypertension in her father; Stroke in her father.  ROS:   Please see the history of present illness.     All other systems reviewed and are negative.  EKGs/Labs/Other Studies Reviewed:    The following studies were reviewed today:   EKG:  EKG is  ordered today.  The ekg ordered today demonstrates sinus rhythm, heart rate 60  Recent Labs: 12/07/2022: ALT 20; BUN 25; Creatinine, Ser 1.51; Hemoglobin 14.4; Platelets 264.0; Potassium 4.2; Sodium 138; TSH 1.25  Recent Lipid Panel    Component Value  Date/Time   CHOL 211 (H) 10/03/2021 0935   TRIG 121.0 10/03/2021 0935   HDL 59.60 10/03/2021 0935   CHOLHDL 4 10/03/2021 0935   VLDL 24.2 10/03/2021 0935   LDLCALC 127 (H) 10/03/2021 0935   LDLDIRECT 127.3 03/23/2013 1459     Risk Assessment/Calculations:             Physical Exam:    VS:  BP 118/76 (BP Location: Left Arm, Patient Position: Sitting, Cuff Size: Normal)   Pulse 81   Ht 5\' 3"  (1.6 m)   Wt 173 lb 6.4 oz (78.7 kg)   SpO2  98%   BMI 30.72 kg/m     Wt Readings from Last 3 Encounters:  01/22/23 173 lb 6.4 oz (78.7 kg)  12/07/22 175 lb 3.2 oz (79.5 kg)  09/30/22 169 lb 12.8 oz (77 kg)     GEN:  Well nourished, well developed in no acute distress HEENT: Normal NECK: No JVD; No carotid bruits CARDIAC: Regular rate and rhythm RESPIRATORY:  Clear to auscultation without rales, wheezing or rhonchi  ABDOMEN: Soft, non-tender, non-distended MUSCULOSKELETAL:  No edema; No deformity  SKIN: Warm and dry NEUROLOGIC:  Alert and oriented x 3 PSYCHIATRIC:  Normal affect   ASSESSMENT:    1. Persistent atrial fibrillation (HCC)   2. Primary hypertension   3. Cardiomyopathy, unspecified type (HCC)    PLAN:    In order of problems listed above:  Persistent A-fib/flutter s/p DC cardioversion 3/24.  EKG today showing atrial flutter, heart rate 81.  Amiodarone previously stopped due to thyroid dysfunction.  Last thyroid function showed normalized levels.  Endorses symptoms of fatigue.  Continue Lopressor 12.5 mg twice daily, Eliquis 5 mg twice daily.  Refer to EP for alternative AAD, ? tikosyn or ablation consideration.  Will plan DC cardioversion. Hypertension, BP controlled.  BP controlled.  Continue Lopressor, Norvasc, HCTZ.  Cardiomyopathy, initial echo 2/24 EF 35 to 40%, possibly tachycardia induced.  Repeat echocardiogram 7/24 EF 50 to 55%.  Management of A-fib as per EP clinic.?  Tikosyn.  Follow-up in 6 weeks.  Informed Consent   Shared Decision Making/Informed  Consent The risks (stroke, cardiac arrhythmias rarely resulting in the need for a temporary or permanent pacemaker, skin irritation or burns and complications associated with conscious sedation including aspiration, arrhythmia, respiratory failure and death), benefits (restoration of normal sinus rhythm) and alternatives of a direct current cardioversion were explained in detail to Monica Lindsey and she agrees to proceed.           Medication Adjustments/Labs and Tests Ordered: Current medicines are reviewed at length with the patient today.  Concerns regarding medicines are outlined above.  Orders Placed This Encounter  Procedures   Basic Metabolic Panel (BMET)   CBC   EKG 12-Lead   No orders of the defined types were placed in this encounter.   Patient Instructions  Medication Instructions:   Your physician recommends that you continue on your current medications as directed. Please refer to the Current Medication list given to you today.  *If you need a refill on your cardiac medications before your next appointment, please call your pharmacy*   Lab Work:  Your physician recommends you have labs - CBC / BMP   If you have labs (blood work) drawn today and your tests are completely normal, you will receive your results only by: MyChart Message (if you have MyChart) OR A paper copy in the mail If you have any lab test that is abnormal or we need to change your treatment, we will call you to review the results.   Testing/Procedures:  You are scheduled for a Cardioversion on 01/25/2023 with Dr. Azucena Cecil.    Please arrive at the Heart & Vascular Center Entrance of [facility], [address], [place] [address] at 11:00 am (This is 1 hour prior to your procedure time).  Proceed to the Check-In Desk directly inside the entrance.  Procedure Parking: Use the entrance off of the [address] side of the hospital. Turn right upon entering and follow the driveway to parking that is  directly in front of the  Heart & Vascular Center. There is no valet parking available at this entrance, however there is an awning directly in front of the Heart & Vascular Center for drop off/ pick up for patients  DIET: Nothing to eat or drink after midnight except a sip of water with medications (see medication instructions below)  Medication Instructions: Hold hydrochlorothiazide  Continue your anticoagulant: Eliquis If you miss a dose, please call us at [phone] You will need to continue your anticoagulant after your procedure until you are told by your provider that it is safe to stop.   Labs: TODAY  FYI: For your safety, and to allow [place] to monitor your vital signs accurately during the surgery/procedure we request that if you have artificial nails, gel coating, SNS etc. Please have those removed prior to your surgery/procedure. Not having the nail coverings /polish removed may result in cancellation or delay of your surgery/procedure.  You must have a responsible person to drive you home and stay in the waiting area during your procedure. Failure to do so could result in cancellation.  Bring your insurance cards.  If you have any questions after you get home, please call the office at [phone]  *Special Note: Every effort is made to have your procedure done on time. Occasionally there are emergencies that occur at the hospital that may cause delays. Please be patient if a delay does occur.        Follow-Up: At [facility], you and your health needs are our priority.  As part of our continuing mission to provide you with exceptional heart care, we have created designated Provider Care Teams.  These Care Teams include your primary Cardiologist (physician) and Advanced Practice Providers (APPs -  Physician Assistants and Nurse Practitioners) who all work together to provide you with the care you need, when you need it.  We recommend signing up for the patient portal  called "MyChart".  Sign up information is provided on this After Visit Summary.  MyChart is used to connect with patients for Virtual Visits (Telemedicine).  Patients are able to view lab/test results, encounter notes, upcoming appointments, etc.  Non-urgent messages can be sent to your provider as well.   To learn more about what you can do with MyChart, go to [email or web address].    Your next appointment:  PLEASE SCHEDULE AN APPT WITH DR. Lalla Brothers 6 week(s)  Provider:   You may see Monica Odea, MD or one of the following Advanced Practice Providers on your designated Care Team:   Nicolasa Ducking, NP Eula Listen, PA-C Cadence Fransico Michael, PA-C Charlsie Quest, NP Carlos Levering, NP    Signed, Monica Odea, MD  01/22/2023 11:10 AM    [facility]

## 2023-01-22 NOTE — Patient Instructions (Signed)
Medication Instructions:   Your physician recommends that you continue on your current medications as directed. Please refer to the Current Medication list given to you today.  *If you need a refill on your cardiac medications before your next appointment, please call your pharmacy*   Lab Work:  Your physician recommends you have labs - CBC / BMP   If you have labs (blood work) drawn today and your tests are completely normal, you will receive your results only by: MyChart Message (if you have MyChart) OR A paper copy in the mail If you have any lab test that is abnormal or we need to change your treatment, we will call you to review the results.   Testing/Procedures:  You are scheduled for a Cardioversion on 01/25/2023 with Dr. Azucena Cecil.    Please arrive at the Heart & Vascular Center Entrance of [facility], [address], [place] [address] at 11:00 am (This is 1 hour prior to your procedure time).  Proceed to the Check-In Desk directly inside the entrance.  Procedure Parking: Use the entrance off of the [address] side of the hospital. Turn right upon entering and follow the driveway to parking that is directly in front of the Heart & Vascular Center. There is no valet parking available at this entrance, however there is an awning directly in front of the Heart & Vascular Center for drop off/ pick up for patients  DIET: Nothing to eat or drink after midnight except a sip of water with medications (see medication instructions below)  Medication Instructions: Hold hydrochlorothiazide  Continue your anticoagulant: Eliquis If you miss a dose, please call us at [phone] You will need to continue your anticoagulant after your procedure until you are told by your provider that it is safe to stop.   Labs: TODAY  FYI: For your safety, and to allow [place] to monitor your vital signs accurately during the surgery/procedure we request that if you have artificial nails, gel coating,  SNS etc. Please have those removed prior to your surgery/procedure. Not having the nail coverings /polish removed may result in cancellation or delay of your surgery/procedure.  You must have a responsible person to drive you home and stay in the waiting area during your procedure. Failure to do so could result in cancellation.  Bring your insurance cards.  If you have any questions after you get home, please call the office at [phone]  *Special Note: Every effort is made to have your procedure done on time. Occasionally there are emergencies that occur at the hospital that may cause delays. Please be patient if a delay does occur.        Follow-Up: At [facility], you and your health needs are our priority.  As part of our continuing mission to provide you with exceptional heart care, we have created designated Provider Care Teams.  These Care Teams include your primary Cardiologist (physician) and Advanced Practice Providers (APPs -  Physician Assistants and Nurse Practitioners) who all work together to provide you with the care you need, when you need it.  We recommend signing up for the patient portal called "MyChart".  Sign up information is provided on this After Visit Summary.  MyChart is used to connect with patients for Virtual Visits (Telemedicine).  Patients are able to view lab/test results, encounter notes, upcoming appointments, etc.  Non-urgent messages can be sent to your provider as well.   To learn more about what you can do with MyChart, go  to [email or web address].    Your next appointment:  PLEASE SCHEDULE AN APPT WITH DR. Lalla Brothers 6 week(s)  Provider:   You may see Debbe Odea, MD or one of the following Advanced Practice Providers on your designated Care Team:   Nicolasa Ducking, NP Eula Listen, PA-C Cadence Fransico Michael, PA-C Charlsie Quest, NP Carlos Levering, NP

## 2023-01-23 ENCOUNTER — Telehealth: Payer: Self-pay | Admitting: Cardiology

## 2023-01-23 LAB — CBC
Hematocrit: 44.2 % (ref 34.0–46.6)
Hemoglobin: 14.8 g/dL (ref 11.1–15.9)
MCH: 30.9 pg (ref 26.6–33.0)
MCHC: 33.5 g/dL (ref 31.5–35.7)
MCV: 92 fL (ref 79–97)
Platelets: 256 10*3/uL (ref 150–450)
RBC: 4.79 x10E6/uL (ref 3.77–5.28)
RDW: 11.8 % (ref 11.7–15.4)
WBC: 6.3 10*3/uL (ref 3.4–10.8)

## 2023-01-23 LAB — BASIC METABOLIC PANEL
BUN/Creatinine Ratio: 19 (ref 12–28)
BUN: 28 mg/dL — ABNORMAL HIGH (ref 8–27)
CO2: 26 mmol/L (ref 20–29)
Calcium: 10.2 mg/dL (ref 8.7–10.3)
Chloride: 98 mmol/L (ref 96–106)
Creatinine, Ser: 1.5 mg/dL — ABNORMAL HIGH (ref 0.57–1.00)
Glucose: 101 mg/dL — ABNORMAL HIGH (ref 70–99)
Potassium: 4.4 mmol/L (ref 3.5–5.2)
Sodium: 141 mmol/L (ref 134–144)
eGFR: 35 mL/min/{1.73_m2} — ABNORMAL LOW (ref >59)

## 2023-01-23 NOTE — Telephone Encounter (Signed)
   Patient woke up this morning around 4:30 with "pin-prick sensation" in her left arm. She says it continues to feel this way, is also sore to touch and seems to hurt when she moves her arm. She has a similar electric/pin-prick sensation in her left chest as well that is not specifically reproducible. She says that occasionally she also feels this in her left shoulder blade. She denies dyspnea, chest tightness, dizziness, heart racing. BP today was 130/78 with HR 91. Patient describes symptoms as mild but says family expressed concern. Given atypical discomfort that sounds more consistent with neurological/msk etiology, advised patient to continue carefully monitoring symptoms. Certainly intermittent RVR could also cause some chest discomfort and patient understands that if she develops any chest tightness, shortness of breath, or detects rapid HR, she should seek emergency evaluation.  Perlie Gold, PA-C

## 2023-01-24 MED ORDER — SODIUM CHLORIDE 0.9% FLUSH: 3.0000 mL | INTRAVENOUS | Status: DC | PRN

## 2023-01-24 MED ORDER — SODIUM CHLORIDE 0.9% FLUSH
3.0000 mL | Freq: Two times a day (BID) | INTRAVENOUS | Status: DC
  Administered 2023-01-25: 3 mL via INTRAVENOUS

## 2023-01-25 ENCOUNTER — Ambulatory Visit: Payer: Medicare PPO | Admitting: Anesthesiology

## 2023-01-25 ENCOUNTER — Encounter
Admission: RE | Disposition: A | Discharge status: Home / Self Care | Payer: Self-pay | Source: Home / Self Care | Attending: Cardiovascular Disease

## 2023-01-25 ENCOUNTER — Other Ambulatory Visit: Payer: Self-pay

## 2023-01-25 ENCOUNTER — Ambulatory Visit
Admission: RE | Admit: 2023-01-25 | Discharge: 2023-01-25 | Disposition: A | Discharge status: Home / Self Care | Payer: Medicare PPO | Attending: Cardiovascular Disease | Admitting: Cardiovascular Disease

## 2023-01-25 ENCOUNTER — Encounter: Payer: Self-pay | Admitting: Cardiovascular Disease

## 2023-01-25 DIAGNOSIS — Z7722 Contact with and (suspected) exposure to environmental tobacco smoke (acute) (chronic): Secondary | ICD-10-CM | POA: Diagnosis not present

## 2023-01-25 DIAGNOSIS — I429 Cardiomyopathy, unspecified: Secondary | ICD-10-CM | POA: Diagnosis not present

## 2023-01-25 DIAGNOSIS — Z7901 Long term (current) use of anticoagulants: Secondary | ICD-10-CM | POA: Diagnosis not present

## 2023-01-25 DIAGNOSIS — Z8249 Family history of ischemic heart disease and other diseases of the circulatory system: Secondary | ICD-10-CM | POA: Insufficient documentation

## 2023-01-25 DIAGNOSIS — Z79899 Other long term (current) drug therapy: Secondary | ICD-10-CM | POA: Diagnosis not present

## 2023-01-25 DIAGNOSIS — I483 Typical atrial flutter: Secondary | ICD-10-CM | POA: Insufficient documentation

## 2023-01-25 DIAGNOSIS — I10 Essential (primary) hypertension: Secondary | ICD-10-CM | POA: Insufficient documentation

## 2023-01-25 DIAGNOSIS — I4891 Unspecified atrial fibrillation: Secondary | ICD-10-CM | POA: Diagnosis not present

## 2023-01-25 DIAGNOSIS — R0602 Shortness of breath: Secondary | ICD-10-CM

## 2023-01-25 DIAGNOSIS — I4819 Other persistent atrial fibrillation: Secondary | ICD-10-CM | POA: Diagnosis not present

## 2023-01-25 HISTORY — PX: CARDIOVERSION: SHX1299

## 2023-01-25 HISTORY — DX: Shortness of breath: R06.02

## 2023-01-25 SURGERY — CARDIOVERSION
Anesthesia: General

## 2023-01-25 MED ORDER — PROPOFOL 500 MG/50ML IV EMUL
INTRAVENOUS | Status: DC | PRN
  Administered 2023-01-25: 50 mg via INTRAVENOUS

## 2023-01-25 NOTE — Progress Notes (Signed)
Cardioversion procedure note For atrial flutter, typical  Procedure Details:  Consent: Risks of procedure as well as the alternatives and risks of each were explained to the (patient/caregiver). Consent for procedure obtained.  Time Out: Verified patient identification, verified procedure, site/side was marked, verified correct patient position, special equipment/implants available, medications/allergies/relevent history reviewed, required imaging and test results available. Performed  Patient placed on cardiac monitor, pulse oximetry, supplemental oxygen as necessary.  Sedation given: propofol IV, Dr. Piscitello Pacer pads placed anterior and posterior chest.   Cardioverted 1 time(s).  Cardioverted at  150J. Synchronized biphasic Converted to NSR   Evaluation: Findings: Post procedure EKG shows: NSR Complications: None Patient did tolerate procedure well.  Time Spent Directly with the Patient:  45 minutes   Tim Lavine Hargrove, M.D., Ph.D. 

## 2023-01-25 NOTE — Anesthesia Preprocedure Evaluation (Signed)
Anesthesia Evaluation  Patient identified by MRN, date of birth, ID band Patient awake    Reviewed: Allergy & Precautions, NPO status , Patient's Chart, lab work & pertinent test results  History of Anesthesia Complications Negative for: history of anesthetic complications  Airway Mallampati: III  TM Distance: <3 FB Neck ROM: full    Dental  (+) Chipped   Pulmonary neg pulmonary ROS, neg shortness of breath   Pulmonary exam normal        Cardiovascular Exercise Tolerance: Good hypertension, (-) angina + dysrhythmias Atrial Fibrillation  Rhythm:irregular Rate:Normal     Neuro/Psych  Headaches  Neuromuscular disease  negative psych ROS   GI/Hepatic negative GI ROS, Neg liver ROS,,,  Endo/Other  negative endocrine ROS    Renal/GU negative Renal ROS  negative genitourinary   Musculoskeletal   Abdominal   Peds  Hematology negative hematology ROS (+)   Anesthesia Other Findings Past Medical History: No date: Arthritis No date: Constipation No date: Frequent headaches No date: Hay fever No date: Heel spur No date: Hyperlipidemia No date: Hypertension  Past Surgical History: No date: ABDOMINAL HYSTERECTOMY     Comment:  parialt ovaries intact 02/02/1981: BACK SURGERY 03/04/2022: CARDIOVERSION; N/A     Comment:  Procedure: CARDIOVERSION;  Surgeon: Debbe Odea,               MD;  Location: [facility] ORS;  Service: Cardiovascular;                Laterality: N/A; 04/08/2022: CARDIOVERSION; N/A     Comment:  Procedure: CARDIOVERSION;  Surgeon: Debbe Odea,               MD;  Location: [facility] ORS;  Service: Cardiovascular;                Laterality: N/A; 04/07/2017: CARPOMETACARPAL (CMC) FUSION OF THUMB; Right     Comment:  Procedure: CARPOMETACARPAL [facility]) FUSION OF THUMB;                Surgeon: Deeann Saint, MD;  Location: [facility] ORS;                Service: Orthopedics;  Laterality: Right; No date:  COLONOSCOPY 09/10/2020: COLONOSCOPY WITH PROPOFOL; N/A     Comment:  Procedure: COLONOSCOPY WITH PROPOFOL;  Surgeon: Midge Minium, MD;  Location: [facility] ENDOSCOPY;  Service:               Endoscopy;  Laterality: N/A; No date: HEMORRHOID SURGERY No date: TOOTH EXTRACTION  BMI    Body Mass Index: 30.73 kg/m      Reproductive/Obstetrics negative OB ROS                             Anesthesia Physical Anesthesia Plan  ASA: 3  Anesthesia Plan: General   Post-op Pain Management:    Induction: Intravenous  PONV Risk Score and Plan: Propofol infusion and TIVA  Airway Management Planned: Natural Airway and Nasal Cannula  Additional Equipment:   Intra-op Plan:   Post-operative Plan:   Informed Consent: I have reviewed the patients History and Physical, chart, labs and discussed the procedure including the risks, benefits and alternatives for the proposed anesthesia with the patient or authorized representative who has indicated his/her understanding and acceptance.     Dental Advisory Given  Plan Discussed with: Anesthesiologist, CRNA and Surgeon  Anesthesia  Plan Comments: (Patient consented for risks of anesthesia including but not limited to:  - adverse reactions to medications - risk of airway placement if required - damage to eyes, teeth, lips or other oral mucosa - nerve damage due to positioning  - sore throat or hoarseness - Damage to heart, brain, nerves, lungs, other parts of body or loss of life  Patient voiced understanding and assent.)       Anesthesia Quick Evaluation

## 2023-01-25 NOTE — Transfer of Care (Signed)
Immediate Anesthesia Transfer of Care Note  Patient: Monica Lindsey  Procedure(s) Performed: CARDIOVERSION  Patient Location:   Anesthesia Type:General  Level of Consciousness: sedated  Airway & Oxygen Therapy: Patient Spontanous Breathing and Patient connected to nasal cannula oxygen  Post-op Assessment: Report given to RN and Post -op Vital signs reviewed and stable  Post vital signs: Reviewed and stable  Last Vitals:  Vitals Value Taken Time  BP    Temp    Pulse 98 01/25/23 1225  Resp 20 01/25/23 1225  SpO2 98 % 01/25/23 1225  Vitals shown include unfiled device data.  Last Pain:  Vitals:   01/25/23 1100  PainSc: 0-No pain         Complications: There were no known notable events for this encounter.

## 2023-01-26 ENCOUNTER — Encounter: Payer: Self-pay | Admitting: Cardiovascular Disease

## 2023-01-27 NOTE — Interval H&P Note (Signed)
History and Physical Interval Note:  01/27/2023 9:19 PM  Monica Lindsey  has presented today for surgery, with the diagnosis of Cardioversion   Afib.  The various methods of treatment have been discussed with the patient and family. After consideration of risks, benefits and other options for treatment, the patient has consented to  Procedure(s): CARDIOVERSION (N/A) as a surgical intervention.  The patient's history has been reviewed, patient examined, no change in status, stable for surgery.  I have reviewed the patient's chart and labs.  Questions were answered to the patient's satisfaction.     Julien Nordmann

## 2023-01-27 NOTE — H&P (Signed)
H&P Addendum, pre-cardioversion ° °Patient was seen and evaluated prior to -cardioversion procedure °Symptoms, prior testing details again confirmed with the patient °Patient examined, no significant change from prior exam °Lab work reviewed in detail personally by myself °Patient understands risk and benefit of the procedure,  °The risks (stroke, cardiac arrhythmias rarely resulting in the need for a temporary or permanent pacemaker, skin irritation or burns and complications associated with conscious sedation including aspiration, arrhythmia, respiratory failure and death), benefits (restoration of normal sinus rhythm) and alternatives of a direct current cardioversion were explained in detail °Patient willing to proceed. ° °Signed, °Tim Da Michelle, MD, Ph.D °[facility]  °

## 2023-02-02 NOTE — Anesthesia Postprocedure Evaluation (Signed)
 Anesthesia Post Note  Patient: Monica Lindsey  Procedure(s) Performed: CARDIOVERSION  Patient location during evaluation: PACU Anesthesia Type: General Level of consciousness: awake and alert Pain management: pain level controlled Vital Signs Assessment: post-procedure vital signs reviewed and stable Respiratory status: spontaneous breathing, nonlabored ventilation, respiratory function stable and patient connected to nasal cannula oxygen Cardiovascular status: blood pressure returned to baseline and stable Postop Assessment: no apparent nausea or vomiting Anesthetic complications: no   There were no known notable events for this encounter.   Last Vitals:  Vitals:   01/25/23 1315 01/25/23 1330  BP: 123/79 123/74  Pulse: 69   Resp: (!) 22 (!) 24  Temp:    SpO2:      Last Pain:  Vitals:   01/25/23 1330  PainSc: 0-No pain                 Lynwood KANDICE Clause

## 2023-02-03 ENCOUNTER — Other Ambulatory Visit: Payer: Self-pay | Admitting: Nurse Practitioner

## 2023-02-03 DIAGNOSIS — I10 Essential (primary) hypertension: Secondary | ICD-10-CM

## 2023-02-10 ENCOUNTER — Telehealth: Payer: Self-pay | Admitting: *Deleted

## 2023-02-10 NOTE — Telephone Encounter (Signed)
 Do you know of any immediate help with eliquis?

## 2023-02-10 NOTE — Telephone Encounter (Signed)
 Copied from CRM [id number]. Topic: Clinical - Prescription Issue >> Feb 10, 2023 10:51 AM Antonio H wrote: Reason for CRM: Patient would like a call back, having an issue with getting her Eliquis , says she has been on it for over a year and a half but when she went to get prescription, they wanted to charge her $450. Patient only has 3 left, wants to know if there are any samples or anything she can get from the office because this medication is very important

## 2023-02-11 ENCOUNTER — Telehealth: Payer: Self-pay | Admitting: Nurse Practitioner

## 2023-02-11 ENCOUNTER — Encounter: Payer: Self-pay | Admitting: Nurse Practitioner

## 2023-02-11 ENCOUNTER — Telehealth: Payer: Self-pay | Admitting: Cardiology

## 2023-02-11 ENCOUNTER — Other Ambulatory Visit (HOSPITAL_COMMUNITY): Payer: Self-pay

## 2023-02-11 ENCOUNTER — Other Ambulatory Visit: Payer: Self-pay | Admitting: Nurse Practitioner

## 2023-02-11 NOTE — Telephone Encounter (Signed)
 Pt daughter made aware of coupon information to give to pharmacy.

## 2023-02-11 NOTE — Telephone Encounter (Signed)
 Called and spoke with daughter [name]. Per daughter patient received a coupon code for 1 free month. Per daughter she will call back in the morning to request refills if the coupon code does not work. Daughter informed of patient assistance.

## 2023-02-11 NOTE — Telephone Encounter (Signed)
 Pt c/o medication issue:  1. Name of Medication: apixaban  (ELIQUIS ) 5 MG TABS tablet   2. How are you currently taking this medication (dosage and times per day)?    3. Are you having a reaction (difficulty breathing--STAT)? no  4. What is your medication issue? Office calling to see if we have any samples for the patient. If so they ask that we contact the patient. Please advise

## 2023-02-11 NOTE — Telephone Encounter (Signed)
 Relayed information to pt and pt daughter Monica Lindsey.  Pt daughter asked what they are supposed to do about the $450 payment for Eliquis  prescription.  Pt declined MPPP (payment plan)  Pt daughter stated that pt will be changing insurance plan because of this situation. States that she only has one pill left.   Advised to pt that I will speak with her provider on the next steps.  Pt spoke directly with pcp.  Pt was able to receive a one time use coupon for the prescription thru [facility] after contacting cardiology for samples.

## 2023-02-11 NOTE — Telephone Encounter (Signed)
 Patient stopped in office and is asking if there is another medication that could be prescribed instead since her out of pocket cost is close to 500. Please advise if there is something else. Would like a call back to discuss.

## 2023-02-11 NOTE — Telephone Encounter (Signed)
 Called and spoke with Patient and daughter (DPR). States that apixaban  is going to cost over 400 dollars. States that she only had one pill left.  Reached out to ambulatory pharmacist and no PAP available currently.  Called cardiology office to see if they had samples. Message was sent to the nurse and awaiting a call or message back Call [facility] and staff states there is a coupon for a month free of medication. Did a test claim and it worked. He will call patients retail pharmacy and give the store the information  I called and spoke to Dennie and shannon and relayed information

## 2023-02-11 NOTE — Telephone Encounter (Signed)
 Patient states she has one pill for tonight left

## 2023-02-11 NOTE — Telephone Encounter (Signed)
 Can we relay the information from the pharmacist to the patient please

## 2023-02-24 ENCOUNTER — Encounter: Payer: Self-pay | Admitting: Nurse Practitioner

## 2023-02-24 ENCOUNTER — Other Ambulatory Visit: Payer: Self-pay | Admitting: Nurse Practitioner

## 2023-02-24 DIAGNOSIS — I10 Essential (primary) hypertension: Secondary | ICD-10-CM

## 2023-02-24 DIAGNOSIS — I4891 Unspecified atrial fibrillation: Secondary | ICD-10-CM

## 2023-02-24 NOTE — Telephone Encounter (Signed)
  Reason for Disposition  [1] Follow-up call to recent contact AND [2] information only call, no triage required  Answer Assessment - Initial Assessment Questions 1. REASON FOR CALL or QUESTION: "What is your reason for calling today?" or "How can I best help you?" or "What question do you have that I can help answer?"         Called patient regarding request for refill on BP medication. Patient states she has one Norvasc tablet left and 6 metoprolol tablets left. Patient currently has refills available for both medications, and were both received by the pharmacy. Patient stated that she called someone earlier this AM to ask about getting these medications filled, however, stated that she couldn't quite recall the conversation, who exactly she spoke to, and seemed confused about what was actually said because she called while sleepy. Patient instructed to call pharmacy back and ask if they have refills available for medications. Patient instructed to call back if pharmacy does not have any refills. Patient verbalized understanding.  Protocols used: Information Only Call - No Triage-A-AH  This encounter was created in error - please disregard.

## 2023-02-24 NOTE — Telephone Encounter (Signed)
Noted  

## 2023-02-24 NOTE — Telephone Encounter (Signed)
Called patient regarding request for refill on BP medication. Patient states she has one Norvasc tablet left and 6 metoprolol tablets left. Patient currently has refills available for both medications, and were both received by the pharmacy. Patient stated that she called someone earlier this AM to ask about getting these medications filled, however, stated that she couldn't quite recall the conversation, who exactly she spoke to, and seemed confused about what was actually said because she called while sleepy. Patient instructed to call pharmacy back and ask if they have refills available for medications. Patient instructed to call back if pharmacy does not have any refills. Patient verbalized understanding.

## 2023-02-24 NOTE — Telephone Encounter (Signed)
Copied from CRM [id number]. Topic: Clinical - Medication Refill >> Feb 24, 2023 10:00 AM Pascal Lux wrote: Most Recent Primary Care Visit:  Provider: Eden Emms  Department: [facility]  Visit Type: SAME DAY  Date: 12/07/2022  Medication: amLODipine (NORVASC) 5 MG tablet [[id number]] - metoprolol tartrate (LOPRESSOR) 25 MG tablet [[id number]]  Has the patient contacted their pharmacy? Yes (Agent: If no, request that the patient contact the pharmacy for the refill. If patient does not wish to contact the pharmacy document the reason why and proceed with request.) (Agent: If yes, when and what did the pharmacy advise?)  Is this the correct pharmacy for this prescription? Yes If no, delete pharmacy and type the correct one.  This is the patient's preferred pharmacy:  [id number] - [place], [place] - [address] AT [facility] [address] [place] [address] [place] [address] Phone: [phone] Fax: [phone]   Has the prescription been filled recently? Yes  Is the patient out of the medication? Yes  Has the patient been seen for an appointment in the last year OR does the patient have an upcoming appointment? Yes  Can we respond through MyChart? No  Agent: Please be advised that Rx refills may take up to 3 business days. We ask that you follow-up with your pharmacy.

## 2023-02-26 ENCOUNTER — Encounter: Payer: Self-pay | Admitting: Nurse Practitioner

## 2023-02-26 DIAGNOSIS — I4891 Unspecified atrial fibrillation: Secondary | ICD-10-CM

## 2023-02-26 DIAGNOSIS — I10 Essential (primary) hypertension: Secondary | ICD-10-CM

## 2023-03-01 ENCOUNTER — Encounter: Payer: Self-pay | Admitting: Nurse Practitioner

## 2023-03-01 MED ORDER — AMLODIPINE BESYLATE 5 MG PO TABS
5.0000 mg | ORAL_TABLET | Freq: Every day | ORAL | 3 refills | Status: AC
Start: 2023-03-01 — End: ?

## 2023-03-01 MED ORDER — APIXABAN 5 MG PO TABS: 5.0000 mg | ORAL_TABLET | Freq: Two times a day (BID) | ORAL | 5 refills | Status: DC

## 2023-03-01 MED ORDER — HYDROCHLOROTHIAZIDE 25 MG PO TABS: ORAL_TABLET | ORAL | 1 refills | Status: DC

## 2023-03-02 ENCOUNTER — Encounter: Payer: Self-pay | Admitting: Cardiology

## 2023-03-02 DIAGNOSIS — I4891 Unspecified atrial fibrillation: Secondary | ICD-10-CM

## 2023-03-02 MED ORDER — METOPROLOL TARTRATE 25 MG PO TABS: 12.5000 mg | ORAL_TABLET | Freq: Two times a day (BID) | ORAL | 1 refills | Status: DC

## 2023-03-09 ENCOUNTER — Encounter: Payer: Self-pay | Admitting: Nurse Practitioner

## 2023-03-09 ENCOUNTER — Ambulatory Visit: Payer: Self-pay | Admitting: Nurse Practitioner

## 2023-03-09 NOTE — Telephone Encounter (Signed)
  Chief Complaint: Cough/Cold Symptoms: cough, runny nose Frequency: Since Sunday Pertinent Negatives: Patient denies fever, CP, SOB Disposition: [] ED /[] Urgent Care (no appt availability in office) / [] Appointment(In office/virtual)/ []  [place] [facility]/ [x] Home Care/ [] Refused Recommended Disposition /[] [place] [facility]/ []  Follow-up with PCP Additional Notes: patient's daughter called asking about OTC cold/cough options for patient with being on blood pressure medication and blood thinners. Patient's daughter is asking for someone to call her back to give her options for OTC medications. Patient verbalized understanding of plan and all questions answered.   Copied from CRM [id number]. Topic: Clinical - Pink Word Triage >> Mar 09, 2023  4:38 PM Tiffany H wrote: Reason for Triage: Active cold symptoms. Congestion. Daughter's calling to ask what cold medicines patient can take while on current list of medication. Pharmacist advised patient to follow up with PCP for suggestions.   Phone: [phone] Clotilda Acron, daughter Reason for Disposition  Cough  Answer Assessment - Initial Assessment Questions 1. ONSET: When did the cough begin?      Cough started Sunday 2. SEVERITY: How bad is the cough today?      Pretty bad 3. SPUTUM: Describe the color of your sputum (none, dry cough; clear, white, yellow, green)     unknown 4. HEMOPTYSIS: Are you coughing up any blood? If so ask: How much? (flecks, streaks, tablespoons, etc.)     unknown 5. DIFFICULTY BREATHING: Are you having difficulty breathing? If Yes, ask: How bad is it? (e.g., mild, moderate, severe)    - MILD: No SOB at rest, mild SOB with walking, speaks normally in sentences, can lie down, no retractions, pulse < 100.    - MODERATE: SOB at rest, SOB with minimal exertion and prefers to sit, cannot lie down flat, speaks in phrases, mild retractions, audible wheezing, pulse 100-120.    - SEVERE: Very SOB at  rest, speaks in single words, struggling to breathe, sitting hunched forward, retractions, pulse > 120      No 6. FEVER: Do you have a fever? If Yes, ask: What is your temperature, how was it measured, and when did it start?     no 7. CARDIAC HISTORY: Do you have any history of heart disease? (e.g., heart attack, congestive heart failure)      High blood pressure 8. LUNG HISTORY: Do you have any history of lung disease?  (e.g., pulmonary embolus, asthma, emphysema)     No 9. PE RISK FACTORS: Do you have a history of blood clots? (or: recent major surgery, recent prolonged travel, bedridden)     no 10. OTHER SYMPTOMS: Do you have any other symptoms? (e.g., runny nose, wheezing, chest pain)       Runny nose  Protocols used: Cough - Acute Non-Productive-A-AH

## 2023-03-10 NOTE — Telephone Encounter (Signed)
 Copied from CRM [id number]. Topic: Clinical - Medication Question >> Mar 10, 2023  1:16 PM Corean SAUNDERS wrote: Reason for CRM: Patients daughter is requesting to speak with Lynwood Crandall or nurse as she is still awaiting an answer on what medication she can giver her mom for her upper respiratory cold that will not interact with the patients blood thinner or blood pressure medication.

## 2023-03-10 NOTE — Telephone Encounter (Signed)
 Can we call and clarify if she got the advice that she was looking for ?

## 2023-03-10 NOTE — Telephone Encounter (Signed)
 Patients daughter called back. No she has not gotten any clarity to her message.

## 2023-03-10 NOTE — Telephone Encounter (Signed)
 Responded via [employer]

## 2023-03-16 NOTE — Progress Notes (Unsigned)
  Electrophysiology Office Follow up Visit Note:    Date:  03/17/2023   ID:  Monica Lindsey, DOB 1945-02-07, MRN [id number]  PCP:  Eden Emms, NP  [facility] Cardiologist:  Debbe Odea, MD  [facility] Electrophysiologist:  Lanier Prude, MD    Interval History:     Monica Lindsey is a 78 y.o. female who presents for a follow up visit.   I last saw the patient April 22, 2022 for persistent atrial fibrillation.  She was previously prescribed amiodarone for her atrial fibrillation but this was discontinued given thyroid dysfunction.  She takes Eliquis for stroke prophylaxis. She last saw Dr. Azucena Cecil January 22, 2023.  At that appointment she reported worsening of her fatigue and dyspnea while out of rhythm.  She was set up for cardioversion which was completed January 25, 2023.  At that appointment Tikosyn and catheter ablation were discussed with the patient.  Today she is doing well.  She is in normal rhythm.  She is recovering from a cold.  She was flu negative.  No GI symptoms.  Only upper respiratory.      Past medical, surgical, social and family history were reviewed.  ROS:   Please see the history of present illness.    All other systems reviewed and are negative.  EKGs/Labs/Other Studies Reviewed:    The following studies were reviewed today:  August 06, 2022 echo EF 50 to 55% RV normal Mildly dilated left atrium Mild to moderate TR  January 25, 2023 EKG shows atypical appearing atrial flutter, left anterior fascicular block  January 22, 2023 EKG shows typical appearing atrial flutter with variable AV block.  Ventricular rate 81 bpm.  March 04, 2022 EKG shows atrial fibrillation, left anterior fascicular block      Physical Exam:    VS:  BP 138/86   Pulse 79   Ht 5\' 3"  (1.6 m)   Wt 166 lb (75.3 kg)   SpO2 97%   BMI 29.41 kg/m     Wt Readings from Last 3 Encounters:  03/17/23 166 lb (75.3 kg)  01/25/23 173 lb 8 oz (78.7 kg)   01/22/23 173 lb 6.4 oz (78.7 kg)     GEN: no distress CARD: RRR, No MRG RESP: No IWOB. CTAB.      ASSESSMENT:    1. Persistent atrial fibrillation (HCC)   2. Primary hypertension    PLAN:    In order of problems listed above:  #Persistent atrial fibrillation #Atrial flutter Symptomatic.  Has required multiple cardioversions.  On Eliquis for stroke prophylaxis.  I discussed treatment options for her atrial fibrillation during today's clinic appointment.  We discussed catheter ablation, antiarrhythmic drugs and continue conservative/watchful waiting.  After our conversation today the patient would like to proceed with a watchful waiting approach which I do not think is unreasonable.  If she were to develop symptomatic recurrence could certainly revisit catheter ablation.  #Hypertension At goal today.  Recommend checking blood pressures 1-2 times per week at home and recording the values.  Recommend bringing these recordings to the primary care physician.  6 months with APP.  Signed, Steffanie Dunn, MD, [facility], [facility] 03/17/2023 11:26 AM    Electrophysiology [facility]

## 2023-03-17 ENCOUNTER — Ambulatory Visit: Payer: HMO | Attending: Cardiology | Admitting: Cardiology

## 2023-03-17 VITALS — BP 138/86 | HR 79 | Ht 63.0 in | Wt 166.0 lb

## 2023-03-17 DIAGNOSIS — I4819 Other persistent atrial fibrillation: Secondary | ICD-10-CM

## 2023-03-17 DIAGNOSIS — I10 Essential (primary) hypertension: Secondary | ICD-10-CM | POA: Diagnosis not present

## 2023-03-17 NOTE — Patient Instructions (Signed)
Medication Instructions:  Your physician recommends that you continue on your current medications as directed. Please refer to the Current Medication list given to you today.  *If you need a refill on your cardiac medications before your next appointment, please call your pharmacy*  Follow-Up: At [facility], you and your health needs are our priority.  As part of our continuing mission to provide you with exceptional heart care, we have created designated Provider Care Teams.  These Care Teams include your primary Cardiologist (physician) and Advanced Practice Providers (APPs -  Physician Assistants and Nurse Practitioners) who all work together to provide you with the care you need, when you need it.  Your next appointment:   6 months  Provider:   You may see Lanier Prude, MD or one of the following Advanced Practice Providers on your designated Care Team:   Francis Dowse, [place] [address]" [place], [place] Sherie Don, NP Canary Brim, NP

## 2023-03-22 ENCOUNTER — Encounter: Payer: Self-pay | Admitting: Cardiology

## 2023-03-22 ENCOUNTER — Ambulatory Visit: Payer: HMO | Attending: Cardiology | Admitting: Cardiology

## 2023-03-22 VITALS — BP 112/84 | HR 57 | Ht 63.0 in | Wt 169.4 lb

## 2023-03-22 DIAGNOSIS — I429 Cardiomyopathy, unspecified: Secondary | ICD-10-CM | POA: Diagnosis not present

## 2023-03-22 DIAGNOSIS — I10 Essential (primary) hypertension: Secondary | ICD-10-CM | POA: Diagnosis not present

## 2023-03-22 DIAGNOSIS — I4819 Other persistent atrial fibrillation: Secondary | ICD-10-CM | POA: Diagnosis not present

## 2023-03-22 NOTE — Patient Instructions (Signed)
 Medication Instructions:   Your physician recommends that you continue on your current medications as directed. Please refer to the Current Medication list given to you today.  *If you need a refill on your cardiac medications before your next appointment, please call your pharmacy*   Lab Work:  None Ordered  If you have labs (blood work) drawn today and your tests are completely normal, you will receive your results only by: MyChart Message (if you have MyChart) OR A paper copy in the mail If you have any lab test that is abnormal or we need to change your treatment, we will call you to review the results.   Testing/Procedures:  None Ordered   Follow-Up: At [facility], you and your health needs are our priority.  As part of our continuing mission to provide you with exceptional heart care, we have created designated Provider Care Teams.  These Care Teams include your primary Cardiologist (physician) and Advanced Practice Providers (APPs -  Physician Assistants and Nurse Practitioners) who all work together to provide you with the care you need, when you need it.  We recommend signing up for the patient portal called "MyChart".  Sign up information is provided on this After Visit Summary.  MyChart is used to connect with patients for Virtual Visits (Telemedicine).  Patients are able to view lab/test results, encounter notes, upcoming appointments, etc.  Non-urgent messages can be sent to your provider as well.   To learn more about what you can do with MyChart, go to [email or web address].    Your next appointment:   6 month(s)  Provider:   You may see Debbe Odea, MD or one of the following Advanced Practice Providers on your designated Care Team:   Nicolasa Ducking, NP Eula Listen, PA-C Cadence Fransico Michael, PA-C Charlsie Quest, NP Carlos Levering, NP

## 2023-03-22 NOTE — Progress Notes (Signed)
 Cardiology Office Note:    Date:  03/22/2023   ID:  Monica Lindsey, DOB 26-Sep-1945, MRN [id number]  PCP:  Eden Emms, NP   [facility] Providers Cardiologist:  Debbe Odea, MD Electrophysiologist:  Lanier Prude, MD     Referring MD: Eden Emms, NP   Chief Complaint  Patient presents with   Follow-up    Patient denies new or acute cardiac problems/concerns today.      History of Present Illness:    Monica Lindsey is a 78 y.o. female with a hx of persistent atrial fibrillation s/p DC cardioversion x 2 (3/24, 12/24), hypertension, hyperlipidemia presents for follow-up.  Previously seen with symptoms of fatigue and sleepiness, EKG showed atrial fibrillation.  DC cardioversion was scheduled and successfully performed 2 months ago.  Patient otherwise doing okay from a cardiac perspective, currently recovering from a cold/flulike symptoms.  Compliant with Eliquis as prescribed, denies any significant bleeding issues.  Followed up with EP, catheter ablation was discussed.  Wait for watching decided at the time.  Prior notes Echo 08/2022 EF 50 to 55% Echo 03/2022 EF 35 to 40% Coronary calcium score 02/2022, calcium score 0. Previously on amiodarone which was stopped due to thyroid dysfunction.  Past Medical History:  Diagnosis Date   Arthritis    Constipation    Frequent headaches    Hay fever    Heel spur    Hyperlipidemia    Hypertension    Persistent atrial fibrillation (HCC)    SOB (shortness of breath) 01/25/2023    Past Surgical History:  Procedure Laterality Date   ABDOMINAL HYSTERECTOMY     parialt ovaries intact   BACK SURGERY  02/02/1981   CARDIOVERSION N/A 03/04/2022   Procedure: CARDIOVERSION;  Surgeon: Debbe Odea, MD;  Location: [facility] ORS;  Service: Cardiovascular;  Laterality: N/A;   CARDIOVERSION N/A 04/08/2022   Procedure: CARDIOVERSION;  Surgeon: Debbe Odea, MD;  Location: [facility] OR[facility];  Service: Cardiovascular;   Laterality: N/A;   CARDIOVERSION N/A 01/25/2023   Procedure: CARDIOVERSION;  Surgeon: Antonieta Iba, MD;  Location: [facility] OR[facility];  Service: Cardiovascular;  Laterality: N/A;   CARPOMETACARPAL (CMC) FUSION OF THUMB Right 04/07/2017   Procedure: CARPOMETACARPAL (CMC) FUSION OF THUMB;  Surgeon: Deeann Saint, MD;  Location: [facility] ORS;  Service: Orthopedics;  Laterality: Right;   COLONOSCOPY     COLONOSCOPY WITH PROPOFOL N/A 09/10/2020   Procedure: COLONOSCOPY WITH PROPOFOL;  Surgeon: Midge Minium, MD;  Location: [facility] ENDOSCOPY;  Service: Endoscopy;  Laterality: N/A;   HEMORRHOID SURGERY     TOOTH EXTRACTION      Current Medications: Current Meds  Medication Sig   amLODipine (NORVASC) 5 MG tablet Take 1 tablet (5 mg total) by mouth daily.   apixaban (ELIQUIS) 5 MG TABS tablet Take 1 tablet (5 mg total) by mouth 2 (two) times daily.   cetirizine (ZYRTEC) 10 MG tablet Take 1 tablet (10 mg total) by mouth daily. (Patient taking differently: Take 10 mg by mouth daily as needed for allergies or rhinitis.)   Cyanocobalamin (VITAMIN B-12 PO) Place 1 tablet under the tongue daily.   fluticasone (FLONASE) 50 MCG/ACT nasal spray Place 2 sprays into both nostrils daily. (Patient taking differently: Place 2 sprays into both nostrils daily as needed for allergies or rhinitis.)   hydrochlorothiazide (HYDRODIURIL) 25 MG tablet TAKE 1 TABLET BY MOUTH DAILY   metoprolol tartrate (LOPRESSOR) 25 MG tablet Take 0.5 tablets (12.5 mg total) by mouth 2 (two) times daily.  Multiple Vitamin (MULTIVITAMIN) tablet Take 1 tablet by mouth daily.   Omega-3 Fatty Acids (FISH OIL) 1000 MG CAPS Take 1,000 mg by mouth daily.   polyethylene glycol (MIRALAX / GLYCOLAX) 17 g packet Take 17 g by mouth daily. (Patient taking differently: Take 17 g by mouth daily as needed for mild constipation or moderate constipation.)   Vitamin D, Cholecalciferol, 25 MCG (1000 UT) TABS Take 1,000 Units by mouth daily.     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  Very close to her 2 children and 2 grand children.Daughter, [name], is HPOA.  Has a living will.Would desire CPR, would not desire prolonged life support if futile.   2 living chiildren    Social Drivers of Health   Financial Resource Strain: Low Risk  (08/04/2022)   Overall Financial Resource Strain (CARDIA)    Difficulty of Paying Living Expenses: Not hard at all  Food Insecurity: No Food Insecurity (08/04/2022)   Hunger Vital Sign    Worried About Running Out of Food in the Last Year: Never true    Ran Out of Food in the Last Year: Never true  Transportation Needs: No Transportation Needs (08/04/2022)   PRAPARE - Administrator, Civil Service (Medical): No    Lack of Transportation (Non-Medical): No  Physical Activity: Insufficiently Active (08/04/2022)   Exercise Vital Sign    Days of Exercise per Week: 3 days    Minutes of Exercise per Session: 10 min  Stress: No Stress Concern Present (08/04/2022)   [employer] of Occupational Health - Occupational Stress Questionnaire    Feeling of Stress : Not at all  Social Connections: Moderately Integrated (08/04/2022)   Social Connection and Isolation Panel [NHANES]    Frequency of Communication with Friends and Family: More than three times a week    Frequency of Social Gatherings with Friends  and Family: Once a week    Attends Religious Services: More than 4 times per year    Active Member of [employer] or Organizations: Yes    Attends Banker Meetings: More than 4 times per year    Marital Status: Widowed     Family History: The patient's family history includes Alcohol abuse in her father; Arthritis in her father; Brain cancer in her sister; Breast cancer in her cousin; Breast cancer (age of onset: 77) in her paternal aunt; Heart disease in her father and mother; Hypertension in her father; Stroke in her father.  ROS:   Please see the history of present illness.     All other systems reviewed and are negative.  EKGs/Labs/Other Studies Reviewed:    The following studies were reviewed today:   EKG Interpretation Date/Time:  Monday March 22 2023 11:31:25 EST  Ventricular Rate:  57 PR Interval:  146 QRS Duration:  92 QT Interval:  430 QTC Calculation: 418 R Axis:   -53  Text Interpretation: Sinus bradycardia Incomplete right bundle branch block Left anterior fascicular block Anterior infarct , age undetermined Confirmed by Debbe Odea ([id number]) on 03/22/2023 11:46:13 AM    Recent Labs: 12/07/2022: ALT 20; TSH 1.25 01/22/2023: BUN 28; Creatinine, Ser 1.50; Hemoglobin 14.8; Platelets 256; Potassium 4.4; Sodium 141  Recent Lipid Panel    Component Value Date/Time   CHOL 211 (H) 10/03/2021 0935   TRIG 121.0 10/03/2021 0935   HDL 59.60 10/03/2021 0935   CHOLHDL 4 10/03/2021 0935   VLDL 24.2 10/03/2021 0935   LDLCALC 127 (H) 10/03/2021 0935   LDLDIRECT 127.3 03/23/2013 1459     Risk Assessment/Calculations:             Physical Exam:    VS:  BP 112/84 (BP Location: Left Arm, Patient Position: Sitting, Cuff Size: Large)   Pulse (!) 57   Ht 5\' 3"  (1.6 m)   Wt 169 lb 6.4 oz (76.8 kg)   SpO2 94%   BMI 30.01 kg/m     Wt Readings from Last 3 Encounters:  03/22/23 169 lb 6.4 oz (76.8 kg)  03/17/23 166 lb (75.3 kg)  01/25/23 173 lb 8 oz (78.7  kg)     GEN:  Well nourished, well developed in no acute distress HEENT: Normal NECK: No JVD; No carotid bruits CARDIAC: Regular rate and rhythm RESPIRATORY:  Clear to auscultation without rales, wheezing or rhonchi  ABDOMEN: Soft, non-tender, non-distended MUSCULOSKELETAL:  No edema; No deformity  SKIN: Warm and dry NEUROLOGIC:  Alert and oriented x 3 PSYCHIATRIC:  Normal affect   ASSESSMENT:    1. Persistent atrial fibrillation (HCC)   2. Primary hypertension   3. Cardiomyopathy, unspecified type (HCC)    PLAN:    In order of problems listed above:  Persistent A-fib/flutter s/p DC cardioversion x 2 (3/24, 12/24).  EKG today showing sinus bradycardia, heart rate 57.  Amiodarone previously stopped due to thyroid dysfunction.  Continue Lopressor 12.5 mg twice daily, Eliquis 5 mg twice daily.  Catheter ablation will be considered if a flutter/symptoms return.  Appreciate input from EP. Hypertension, BP controlled.  BP controlled.  Continue Lopressor 12.5 mg twice daily, Norvasc 5 mg daily, HCTZ 25 mg daily.  Cardiomyopathy, initial echo 2/24 EF 35 to 40%, possibly tachycardia induced.  Repeat echocardiogram 7/24 EF 50 to 55%.    Follow-up in 6 months         Medication Adjustments/Labs and Tests Ordered: Current medicines are reviewed at length with the patient today.  Concerns regarding medicines are outlined above.  Orders Placed This Encounter  Procedures   EKG 12-Lead   No orders of the defined types were placed in this encounter.   Patient Instructions  Medication Instructions:   Your physician recommends that you continue on your current medications as directed. Please refer to the Current Medication list given to you today.  *If you need a refill on your cardiac medications before your next appointment, please call your pharmacy*   Lab Work:  None Ordered  If you have labs (blood work) drawn today and your tests are completely normal, you will receive your  results only by: MyChart Message (if you have MyChart) OR A paper copy in the mail If you have any lab test that is abnormal or we need to change your treatment, we will call you to  review the results.   Testing/Procedures:  None Ordered   Follow-Up: At [facility], you and your health needs are our priority.  As part of our continuing mission to provide you with exceptional heart care, we have created designated Provider Care Teams.  These Care Teams include your primary Cardiologist (physician) and Advanced Practice Providers (APPs -  Physician Assistants and Nurse Practitioners) who all work together to provide you with the care you need, when you need it.  We recommend signing up for the patient portal called "MyChart".  Sign up information is provided on this After Visit Summary.  MyChart is used to connect with patients for Virtual Visits (Telemedicine).  Patients are able to view lab/test results, encounter notes, upcoming appointments, etc.  Non-urgent messages can be sent to your provider as well.   To learn more about what you can do with MyChart, go to [email or web address].    Your next appointment:   6 month(s)  Provider:   You may see Debbe Odea, MD or one of the following Advanced Practice Providers on your designated Care Team:   Nicolasa Ducking, NP Eula Listen, PA-C Cadence Fransico Michael, PA-C Charlsie Quest, NP Carlos Levering, NP   Signed, Debbe Odea, MD  03/22/2023 12:21 PM    [facility]

## 2023-04-07 ENCOUNTER — Other Ambulatory Visit (HOSPITAL_COMMUNITY): Payer: Self-pay

## 2023-05-04 ENCOUNTER — Other Ambulatory Visit: Payer: Self-pay | Admitting: Nurse Practitioner

## 2023-05-04 DIAGNOSIS — I10 Essential (primary) hypertension: Secondary | ICD-10-CM

## 2023-07-09 DIAGNOSIS — D6832 Hemorrhagic disorder due to extrinsic circulating anticoagulants: Secondary | ICD-10-CM | POA: Diagnosis not present

## 2023-07-09 DIAGNOSIS — M7061 Trochanteric bursitis, right hip: Secondary | ICD-10-CM | POA: Diagnosis not present

## 2023-07-23 ENCOUNTER — Other Ambulatory Visit: Payer: Self-pay

## 2023-07-23 ENCOUNTER — Emergency Department

## 2023-07-23 ENCOUNTER — Emergency Department
Admission: EM | Admit: 2023-07-23 | Discharge: 2023-07-23 | Disposition: A | Discharge status: Home / Self Care | Attending: Emergency Medicine | Admitting: Emergency Medicine

## 2023-07-23 DIAGNOSIS — Z7901 Long term (current) use of anticoagulants: Secondary | ICD-10-CM | POA: Diagnosis not present

## 2023-07-23 DIAGNOSIS — Y92511 Restaurant or cafe as the place of occurrence of the external cause: Secondary | ICD-10-CM | POA: Diagnosis not present

## 2023-07-23 DIAGNOSIS — I6529 Occlusion and stenosis of unspecified carotid artery: Secondary | ICD-10-CM | POA: Diagnosis not present

## 2023-07-23 DIAGNOSIS — S0003XA Contusion of scalp, initial encounter: Secondary | ICD-10-CM | POA: Diagnosis not present

## 2023-07-23 DIAGNOSIS — I6782 Cerebral ischemia: Secondary | ICD-10-CM | POA: Diagnosis not present

## 2023-07-23 DIAGNOSIS — S0990XA Unspecified injury of head, initial encounter: Secondary | ICD-10-CM | POA: Diagnosis not present

## 2023-07-23 DIAGNOSIS — W01198A Fall on same level from slipping, tripping and stumbling with subsequent striking against other object, initial encounter: Secondary | ICD-10-CM | POA: Diagnosis not present

## 2023-07-23 DIAGNOSIS — E042 Nontoxic multinodular goiter: Secondary | ICD-10-CM | POA: Diagnosis not present

## 2023-07-23 NOTE — ED Provider Notes (Signed)
 [facility] Provider Note    Event Date/Time   First MD Initiated Contact with Patient 07/23/23 1634     (approximate)   History   Fall   HPI  Monica Lindsey is a 78 y.o. female past medical history significant for anticoagulation use, who presents to the emergency department following a fall.  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  Hit the back of her head.  No loss of consciousness.  States that she had significant swelling initially but it is gone down since arriving to the emergency department.  Denies any neck pain.  Denies any numbness or weakness in arms or legs.  No preceding symptoms of lightheadedness, chest pain or shortness of breath.  No syncope.     Physical Exam   Triage Vital Signs: ED Triage Vitals [07/23/23 1529]  Encounter Vitals Group     BP 133/70     Girls Systolic BP Percentile      Girls Diastolic BP Percentile      Boys Systolic BP Percentile      Boys Diastolic BP Percentile      Pulse Rate 61     Resp 17     Temp 97.6 F (36.4 C)     Temp Source Oral     SpO2 98 %     Weight 165 lb (74.8 kg)     Height 5' 4 (1.626 m)     Head Circumference      Peak Flow      Pain Score 0     Pain Loc      Pain Education      Exclude from Growth Chart     Most recent vital signs: Vitals:   07/23/23 1529  BP: 133/70  Pulse: 61  Resp: 17  Temp: 97.6 F (36.4 C)  SpO2: 98%    Physical Exam HENT:     Head:     Comments: Hematoma to occipital scalp    Right Ear: External ear normal.     Left Ear: External ear normal.   Eyes:     Extraocular Movements: Extraocular movements intact.     Pupils: Pupils are equal, round, and reactive to light.    Cardiovascular:     Rate and Rhythm: Normal rate.  Pulmonary:     Effort: No respiratory distress.  Abdominal:     General: There is no distension.   Musculoskeletal:        General: No tenderness. Normal range of motion.     Cervical back:  No tenderness.   Neurological:     General: No focal deficit present.     Mental Status: She is alert.     IMPRESSION / MDM / ASSESSMENT AND PLAN / ED COURSE  I reviewed the triage vital signs and the nursing notes.  Differential diagnosis including intracranial hemorrhage, concussion, cervical spine fracture.  Low suspicion for basilar skull fracture.  No concern for ligamentous injury.  RADIOLOGY CT scan of the head and cervical spine was read as no acute findings.  LABS (all labs ordered are listed, but only abnormal results are displayed) Labs interpreted as -    Labs Reviewed - No data to display   MDM    Presents to the emergency department following a fall on anticoagulation with head injury.  Nonsyncopal fall.  CT scan with no acute findings.  Discharged home with primary care follow-up and discussed return precautions.  PROCEDURES:  Critical Care performed: No  Procedures  Patient's presentation is most consistent with acute presentation with potential threat to life or bodily function.   MEDICATIONS ORDERED IN ED: Medications - No data to display  FINAL CLINICAL IMPRESSION(S) / ED DIAGNOSES   Final diagnoses:  Injury of head, initial encounter     Rx / DC Orders   ED Discharge Orders     None        Note:  This document was prepared using Dragon voice recognition software and may include unintentional dictation errors.   Viviano Ground, MD 07/23/23 1721

## 2023-07-23 NOTE — ED Triage Notes (Signed)
 Pt sts that she has fallen at a [employer]. Pt sts that hit her head. Pt is on Eliquis .  Pt is A/Ox4 at this time.

## 2023-07-30 ENCOUNTER — Ambulatory Visit (INDEPENDENT_AMBULATORY_CARE_PROVIDER_SITE_OTHER): Admitting: Nurse Practitioner

## 2023-07-30 VITALS — BP 110/78 | HR 58 | Temp 97.6°F | Ht 64.0 in | Wt 168.0 lb

## 2023-07-30 DIAGNOSIS — Z09 Encounter for follow-up examination after completed treatment for conditions other than malignant neoplasm: Secondary | ICD-10-CM | POA: Diagnosis not present

## 2023-07-30 DIAGNOSIS — M25551 Pain in right hip: Secondary | ICD-10-CM | POA: Diagnosis not present

## 2023-07-30 NOTE — Patient Instructions (Signed)
 Nice to see you today I want to see you 2 months for your physical and labs

## 2023-07-30 NOTE — Assessment & Plan Note (Signed)
 Did review patient's ED note along with imaging.

## 2023-07-30 NOTE — Progress Notes (Signed)
 Acute Office Visit  Subjective:     Patient ID: Monica Lindsey, female    DOB: 08-19-1945, 78 y.o.   MRN: [id number]  Chief Complaint  Patient presents with   Hospitalization Follow-up    ER visit due to fall with head injury. States of only having a large lump.    Hip and side thigh pain    Pt complains of ongoing Hip pain that she has received injections for in the past. States they have worked until about 2 weeks ago. States pain is worsening.     HPI Patient is in today for hosptial follow up  Patient was seen in the emergency department on 07/23/2023.  This was status post fall.  Patient fell when she tried to scoot out of the booth at [employer].  She did hit the back of her head but denied being knocked out.  She did have some significant swelling initially but stated it went down the time she got to the emergency department.  Patient is anticoagulated.  They did do a CT scan of head and neck which both came back without acute findings.  States that she did have a lump of the size of an egg half. States it is still a little swollen but improved. Stats that the next day her neck muscle were tight.  Right hip pain: states that when she stands up it hurts worse. State that when she sits she can notice it some. States that she has to stand there for a few seconds to get started walking. States that it will hurt some but not as bad. She has been seen byu Ortho and had done an injectoin   Review of Systems  Constitutional:  Negative for chills and fever.  Respiratory:  Negative for shortness of breath.   Cardiovascular:  Negative for chest pain.  Musculoskeletal:  Positive for joint pain.  Neurological:  Negative for dizziness and headaches.        Objective:    BP 110/78   Pulse (!) 58   Temp 97.6 F (36.4 C) (Oral)   Ht 5' 4 (1.626 m)   Wt 168 lb (76.2 kg)   SpO2 97%   BMI 28.84 kg/m  BP Readings from Last 3 Encounters:  07/30/23 110/78  07/23/23 133/70   03/22/23 112/84   Wt Readings from Last 3 Encounters:  07/30/23 168 lb (76.2 kg)  07/23/23 165 lb (74.8 kg)  03/22/23 169 lb 6.4 oz (76.8 kg)   SpO2 Readings from Last 3 Encounters:  07/30/23 97%  07/23/23 98%  03/22/23 94%      Physical Exam Vitals and nursing note reviewed.  Constitutional:      Appearance: Normal appearance.  HENT:     Head:      Mouth/Throat:     Mouth: Mucous membranes are moist.     Pharynx: Oropharynx is clear.   Cardiovascular:     Rate and Rhythm: Normal rate and regular rhythm.     Heart sounds: Normal heart sounds.  Pulmonary:     Effort: Pulmonary effort is normal.     Breath sounds: Normal breath sounds.  Lymphadenopathy:     Cervical: No cervical adenopathy.   Neurological:     General: No focal deficit present.     Mental Status: She is alert.     Deep Tendon Reflexes:     Reflex Scores:      Bicep reflexes are 1+ on the right side and 1+ on  the left side.      Patellar reflexes are 1+ on the right side and 1+ on the left side.    Comments: Bilateral upper and lower extremity strength 5/5    No results found for any visits on 07/30/23.      Assessment & Plan:   Problem List Items Addressed This Visit       Other   Right hip pain   History of the same has been followed by [facility] recently had an injection in the hip approximately 3 weeks ago.  Patient is having more difficulty with hip encourage patient to follow-up with orthopedist for further evaluation and treatment      Hospital discharge follow-up - Primary   Did review patient's ED note along with imaging.       No orders of the defined types were placed in this encounter.   Return in about 2 months (around 09/29/2023) for CPE and Labs.  Adina Crandall, NP

## 2023-07-30 NOTE — Assessment & Plan Note (Signed)
 History of the same has been followed by [facility] recently had an injection in the hip approximately 3 weeks ago.  Patient is having more difficulty with hip encourage patient to follow-up with orthopedist for further evaluation and treatment

## 2023-08-10 DIAGNOSIS — M25551 Pain in right hip: Secondary | ICD-10-CM | POA: Diagnosis not present

## 2023-08-13 DIAGNOSIS — S76311D Strain of muscle, fascia and tendon of the posterior muscle group at thigh level, right thigh, subsequent encounter: Secondary | ICD-10-CM | POA: Diagnosis not present

## 2023-08-13 DIAGNOSIS — S76011D Strain of muscle, fascia and tendon of right hip, subsequent encounter: Secondary | ICD-10-CM | POA: Diagnosis not present

## 2023-08-24 ENCOUNTER — Encounter

## 2023-09-02 ENCOUNTER — Telehealth: Payer: Self-pay | Admitting: Nurse Practitioner

## 2023-09-02 NOTE — Telephone Encounter (Signed)
 Copied from CRM [id number]. Topic: Medical Record Request - Records Request >> Sep 01, 2023  2:54 PM Deleta RAMAN wrote: Reason for CRM: Miquel from [facility] is calling to get medical records on the patient regarding conditions. Please follow up via phone or fax [phone]

## 2023-09-06 DIAGNOSIS — M25551 Pain in right hip: Secondary | ICD-10-CM | POA: Diagnosis not present

## 2023-09-07 NOTE — Telephone Encounter (Signed)
 Advised all medical record request need to go through Health Information Management (HIM) @ [phone]

## 2023-09-08 DIAGNOSIS — M7061 Trochanteric bursitis, right hip: Secondary | ICD-10-CM | POA: Diagnosis not present

## 2023-09-14 DIAGNOSIS — M25551 Pain in right hip: Secondary | ICD-10-CM | POA: Diagnosis not present

## 2023-09-16 DIAGNOSIS — M7061 Trochanteric bursitis, right hip: Secondary | ICD-10-CM | POA: Diagnosis not present

## 2023-09-21 DIAGNOSIS — M25551 Pain in right hip: Secondary | ICD-10-CM | POA: Diagnosis not present

## 2023-09-28 DIAGNOSIS — M25551 Pain in right hip: Secondary | ICD-10-CM | POA: Diagnosis not present

## 2023-10-05 DIAGNOSIS — M25551 Pain in right hip: Secondary | ICD-10-CM | POA: Diagnosis not present

## 2023-10-07 ENCOUNTER — Ambulatory Visit (INDEPENDENT_AMBULATORY_CARE_PROVIDER_SITE_OTHER): Admitting: Nurse Practitioner

## 2023-10-07 ENCOUNTER — Encounter: Payer: Self-pay | Admitting: Nurse Practitioner

## 2023-10-07 VITALS — BP 130/82 | HR 66 | Temp 98.0°F | Ht 62.75 in | Wt 168.2 lb

## 2023-10-07 DIAGNOSIS — I4819 Other persistent atrial fibrillation: Secondary | ICD-10-CM

## 2023-10-07 DIAGNOSIS — Z1231 Encounter for screening mammogram for malignant neoplasm of breast: Secondary | ICD-10-CM | POA: Diagnosis not present

## 2023-10-07 DIAGNOSIS — E669 Obesity, unspecified: Secondary | ICD-10-CM | POA: Diagnosis not present

## 2023-10-07 DIAGNOSIS — E538 Deficiency of other specified B group vitamins: Secondary | ICD-10-CM | POA: Diagnosis not present

## 2023-10-07 DIAGNOSIS — Z131 Encounter for screening for diabetes mellitus: Secondary | ICD-10-CM | POA: Diagnosis not present

## 2023-10-07 DIAGNOSIS — Z23 Encounter for immunization: Secondary | ICD-10-CM

## 2023-10-07 DIAGNOSIS — I10 Essential (primary) hypertension: Secondary | ICD-10-CM

## 2023-10-07 DIAGNOSIS — E785 Hyperlipidemia, unspecified: Secondary | ICD-10-CM | POA: Diagnosis not present

## 2023-10-07 DIAGNOSIS — Z0000 Encounter for general adult medical examination without abnormal findings: Secondary | ICD-10-CM

## 2023-10-07 LAB — CBC WITH DIFFERENTIAL/PLATELET
Basophils Absolute: 0 K/uL (ref 0.0–0.1)
Basophils Relative: 0.5 % (ref 0.0–3.0)
Eosinophils Absolute: 0.1 K/uL (ref 0.0–0.7)
Eosinophils Relative: 1.2 % (ref 0.0–5.0)
HCT: 41.9 % (ref 36.0–46.0)
Hemoglobin: 13.9 g/dL (ref 12.0–15.0)
Lymphocytes Relative: 24.2 % (ref 12.0–46.0)
Lymphs Abs: 1.5 K/uL (ref 0.7–4.0)
MCHC: 33.2 g/dL (ref 30.0–36.0)
MCV: 91.2 fl (ref 78.0–100.0)
Monocytes Absolute: 0.5 K/uL (ref 0.1–1.0)
Monocytes Relative: 8.1 % (ref 3.0–12.0)
Neutro Abs: 4 K/uL (ref 1.4–7.7)
Neutrophils Relative %: 66 % (ref 43.0–77.0)
Platelets: 244 K/uL (ref 150.0–400.0)
RBC: 4.59 Mil/uL (ref 3.87–5.11)
RDW: 13.4 % (ref 11.5–15.5)
WBC: 6.1 K/uL (ref 4.0–10.5)

## 2023-10-07 LAB — COMPREHENSIVE METABOLIC PANEL WITH GFR
ALT: 12 U/L (ref 0–35)
AST: 15 U/L (ref 0–37)
Albumin: 4.4 g/dL (ref 3.5–5.2)
Alkaline Phosphatase: 41 U/L (ref 39–117)
BUN: 32 mg/dL — ABNORMAL HIGH (ref 6–23)
CO2: 31 meq/L (ref 19–32)
Calcium: 9.8 mg/dL (ref 8.4–10.5)
Chloride: 101 meq/L (ref 96–112)
Creatinine, Ser: 1.33 mg/dL — ABNORMAL HIGH (ref 0.40–1.20)
GFR: 37.96 mL/min — ABNORMAL LOW (ref >60.00)
Glucose, Bld: 83 mg/dL (ref 70–99)
Potassium: 4.2 meq/L (ref 3.5–5.1)
Sodium: 139 meq/L (ref 135–145)
Total Bilirubin: 0.6 mg/dL (ref 0.2–1.2)
Total Protein: 6.8 g/dL (ref 6.0–8.3)

## 2023-10-07 LAB — LIPID PANEL
Cholesterol: 277 mg/dL — ABNORMAL HIGH (ref 0–200)
HDL: 67.5 mg/dL (ref >39.00)
LDL Cholesterol: 191 mg/dL — ABNORMAL HIGH (ref 0–99)
NonHDL: 209.62
Total CHOL/HDL Ratio: 4
Triglycerides: 92 mg/dL (ref 0.0–149.0)
VLDL: 18.4 mg/dL (ref 0.0–40.0)

## 2023-10-07 LAB — TSH: TSH: 1.06 u[IU]/mL (ref 0.35–5.50)

## 2023-10-07 LAB — VITAMIN B12: Vitamin B-12: 297 pg/mL (ref 211–911)

## 2023-10-07 LAB — HEMOGLOBIN A1C: Hgb A1c MFr Bld: 5.7 % (ref 4.6–6.5)

## 2023-10-07 NOTE — Assessment & Plan Note (Signed)
 History of the same currently maintained on Eliquis  5 mg twice daily and metoprolol  12.5 mg twice daily and followed by cardiology.  Continue taking medication as prescribed follow-up with cardiology as recommended

## 2023-10-07 NOTE — Assessment & Plan Note (Signed)
 Patient currently maintained on amlodipine  5 mg daily, HCTZ 25 mg daily, metoprolol  12.5 mg twice daily.  Blood pressure controlled.  Stable.  Continue medication as prescribed

## 2023-10-07 NOTE — Assessment & Plan Note (Signed)
Pending B12 level today

## 2023-10-07 NOTE — Assessment & Plan Note (Signed)
History of same pending lipid panel today

## 2023-10-07 NOTE — Assessment & Plan Note (Signed)
 Discussed age-appropriate immunizations and screening exams.  Did review patient's personal, surgical, social, family history.  Patient is up-to-date with all age-appropriate vaccinations he would like.  Update flu vaccine today.  Patient is up-to-date on CRC screening.  Order placed for mammogram today.  DEXA scan up-to-date.  Patient no longer participates in this Pap smears due to hysterectomy and age.  Patient was given information at discharge about preventative healthcare maintenance with anticipatory guidance.

## 2023-10-07 NOTE — Patient Instructions (Signed)
Nice to see you today I will be in touch with the labs once I have them Follow up with me in 6 months, sooner if you need me  We did update your flu vaccine today

## 2023-10-07 NOTE — Assessment & Plan Note (Signed)
 Pending TSH, A1c, lipid panel.

## 2023-10-07 NOTE — Progress Notes (Signed)
 Established Patient Office Visit  Subjective   Patient ID: Monica Lindsey, female    DOB: 05-Aug-1945  Age: 78 y.o. MRN: [id number]  Chief Complaint  Patient presents with   Annual Exam    HPI  A-fib: Patient currently maintained on apixaban  5 mg twice daily and metoprolol  12.5 mg twice daily followed by cardiology  HTN: Patient currently maintained on amlodipine  5 mg daily, hydrochlorothiazide  25 mg daily, and metoprolol  12.5 mg twice daily. She does check her bloodpressure at home every few days   for complete physical and follow up of chronic conditions.  Immunizations: -Tetanus: Completed in 2009 -Influenza: Update today -Shingles: Completed Shingrix  series -Pneumonia: Completed 2015.  Needs Prevnar 20  Diet: Fair diet. She is eating 2-3 meals a day. She is eating breakfast and dinner. She is doing water and english tea, green tea,  Exercise: No regular exercise.  Eye exam: needs updating and does reading glasses.  Dental exam: Completes semi-annually    Colonoscopy: Completed in 09/10/2020.  Repeat colonoscopy not recommended due to patient's age Lung Cancer Screening: N/A  Pap smear: Status post hysterectomy  Mammogram: 04/24/2022, due  DEXA: 10/20/2022  Sleep: she will fall asleep in her chair and then wake up around 3am and have some trouble getting to sleep.  Advanced directive: does have one       Review of Systems  Constitutional:  Negative for chills and fever.  Respiratory:  Negative for shortness of breath.   Cardiovascular:  Negative for chest pain and leg swelling.  Gastrointestinal:  Negative for abdominal pain, blood in stool, constipation, diarrhea, nausea and vomiting.       BM once every 3 days. She will use magnesium citrate 1/3 of a bottle   Genitourinary:  Negative for dysuria and hematuria.  Neurological:  Negative for dizziness, tingling and headaches.  Psychiatric/Behavioral:  Negative for hallucinations and suicidal ideas.        Objective:     BP 130/82   Pulse 66   Temp 98 F (36.7 C) (Oral)   Ht 5' 2.75 (1.594 m)   Wt 168 lb 3.2 oz (76.3 kg)   SpO2 97%   BMI 30.03 kg/m  BP Readings from Last 3 Encounters:  10/07/23 130/82  07/30/23 110/78  07/23/23 133/70   Wt Readings from Last 3 Encounters:  10/07/23 168 lb 3.2 oz (76.3 kg)  07/30/23 168 lb (76.2 kg)  07/23/23 165 lb (74.8 kg)   SpO2 Readings from Last 3 Encounters:  10/07/23 97%  07/30/23 97%  07/23/23 98%      Physical Exam Vitals and nursing note reviewed.  Constitutional:      Appearance: Normal appearance.  HENT:     Right Ear: Tympanic membrane, ear canal and external ear normal.     Left Ear: Tympanic membrane, ear canal and external ear normal.     Mouth/Throat:     Mouth: Mucous membranes are moist.     Pharynx: Oropharynx is clear.  Eyes:     Extraocular Movements: Extraocular movements intact.     Pupils: Pupils are equal, round, and reactive to light.  Cardiovascular:     Rate and Rhythm: Normal rate and regular rhythm.     Pulses: Normal pulses.     Heart sounds: Normal heart sounds.  Pulmonary:     Effort: Pulmonary effort is normal.     Breath sounds: Normal breath sounds.  Abdominal:     General: Bowel sounds are normal. There is no  distension.     Palpations: There is no mass.     Tenderness: There is no abdominal tenderness.     Hernia: No hernia is present.  Musculoskeletal:     Right lower leg: No edema.     Left lower leg: No edema.  Lymphadenopathy:     Cervical: No cervical adenopathy.  Skin:    General: Skin is warm.  Neurological:     General: No focal deficit present.     Mental Status: She is alert.     Deep Tendon Reflexes:     Reflex Scores:      Bicep reflexes are 2+ on the right side and 2+ on the left side.      Patellar reflexes are 2+ on the right side and 2+ on the left side.    Comments: Bilateral upper and lower extremity strength 5/5  Psychiatric:        Mood and Affect: Mood  normal.        Behavior: Behavior normal.        Thought Content: Thought content normal.        Judgment: Judgment normal.      No results found for any visits on 10/07/23.    The 10-year ASCVD risk score (Arnett DK, et al., 2019) is: 32.2%    Assessment & Plan:   Problem List Items Addressed This Visit       Cardiovascular and Mediastinum   Essential hypertension   Patient currently maintained on amlodipine  5 mg daily, HCTZ 25 mg daily, metoprolol  12.5 mg twice daily.  Blood pressure controlled.  Stable.  Continue medication as prescribed      Relevant Medications   pravastatin  (PRAVACHOL ) 40 MG tablet   Other Relevant Orders   CBC with Differential/Platelet   Comprehensive metabolic panel with GFR   TSH   Lipid panel   Hemoglobin A1c   Persistent atrial fibrillation (HCC)   History of the same currently maintained on Eliquis  5 mg twice daily and metoprolol  12.5 mg twice daily and followed by cardiology.  Continue taking medication as prescribed follow-up with cardiology as recommended      Relevant Medications   pravastatin  (PRAVACHOL ) 40 MG tablet   Other Relevant Orders   TSH     Other   HLD (hyperlipidemia)   History of same pending lipid panel today      Relevant Medications   pravastatin  (PRAVACHOL ) 40 MG tablet   Other Relevant Orders   Lipid panel   Hemoglobin A1c   Obesity (BMI 30-39.9)   Pending TSH, A1c, lipid panel.        Relevant Orders   TSH   Lipid panel   Hemoglobin A1c   B12 deficiency   Pending B12 level today      Relevant Orders   Vitamin B12   Preventative health care - Primary   Discussed age-appropriate immunizations and screening exams.  Did review patient's personal, surgical, social, family history.  Patient is up-to-date with all age-appropriate vaccinations he would like.  Update flu vaccine today.  Patient is up-to-date on CRC screening.  Order placed for mammogram today.  DEXA scan up-to-date.  Patient no longer  participates in this Pap smears due to hysterectomy and age.  Patient was given information at discharge about preventative healthcare maintenance with anticipatory guidance.      Relevant Orders   CBC with Differential/Platelet   Comprehensive metabolic panel with GFR   TSH   Other Visit Diagnoses  Screening for diabetes mellitus       Relevant Orders   Hemoglobin A1c     Need for influenza vaccination       Relevant Orders   Flu vaccine HIGH DOSE PF(Fluzone Trivalent) (Completed)     Screening mammogram for breast cancer       Relevant Orders   MM 3D SCREENING MAMMOGRAM BILATERAL BREAST       Return in about 6 months (around 04/05/2024) for BP recheck.    Adina Crandall, NP

## 2023-10-08 ENCOUNTER — Ambulatory Visit: Payer: Self-pay | Admitting: Nurse Practitioner

## 2023-10-08 DIAGNOSIS — R7303 Prediabetes: Secondary | ICD-10-CM | POA: Insufficient documentation

## 2023-10-11 ENCOUNTER — Other Ambulatory Visit: Payer: Self-pay | Admitting: Cardiology

## 2023-10-11 ENCOUNTER — Other Ambulatory Visit: Payer: Self-pay | Admitting: Nurse Practitioner

## 2023-10-11 DIAGNOSIS — I4891 Unspecified atrial fibrillation: Secondary | ICD-10-CM

## 2023-10-15 DIAGNOSIS — M25551 Pain in right hip: Secondary | ICD-10-CM | POA: Diagnosis not present

## 2023-10-20 NOTE — Telephone Encounter (Signed)
-----   Message from [facility] T sent at 10/19/2023  1:53 PM EDT ----- Called patient reviewed all information and repeated back to me. Will call if any questions.   Pt states that her cardiologist said that her cholesterol was extremely low. Pt is taking pravastatin  daily and states that she has cut out sweets and decreased carbohydrates a long time ago.  ----- Message ----- From: Wendee Lynwood HERO, NP Sent: 10/08/2023   7:35 AM EDT To: Wendee Gunnels  Notified via My Chart   See if she is taking her pravastatin  40mg  daily  ----- Message ----- From: Interface, Lab In Three Zero One Sent: 10/07/2023   2:37 PM EDT To: Lynwood HERO Wendee, NP

## 2023-10-20 NOTE — Telephone Encounter (Signed)
 Not sure she meant to say that her cholesterol was low. See most recent labs. She states that she is taking her pravastatin 

## 2023-10-21 MED ORDER — PRAVASTATIN SODIUM 80 MG PO TABS: 80.0000 mg | ORAL_TABLET | Freq: Every evening | ORAL | 3 refills | Status: AC

## 2023-10-21 NOTE — Addendum Note (Signed)
 Addended by: BRIEN SALM on: 10/21/2023 03:12 PM   Modules accepted: Orders

## 2023-10-21 NOTE — Telephone Encounter (Signed)
 Copied from CRM [id number]. Topic: Clinical - Lab/Test Results >> Oct 21, 2023  3:59 PM Donna BRAVO wrote: Reason for CRM: patient has questions about lab results and medications patient would like to speak with Lynwood CHRISTELLA Crandall NP nurse

## 2023-10-22 NOTE — Telephone Encounter (Signed)
 Talked to patient. Did review RN note from cardiology. They did send in new script for pravastatin  80mg . I encouraged patient to take medication as prescribed

## 2023-10-22 NOTE — Telephone Encounter (Signed)
-----   Message from Redell Cave sent at 10/21/2023  2:08 PM EDT ----- Cholesterol obviously is elevated.  Increase Pravachol  to 80 mg daily.  Summer please prescribe Pravachol  and send to patient's pharmacy.  ----- Message ----- From: Wendee Lynwood HERO, NP Sent: 10/20/2023   7:54 AM EDT To: Redell Cave, MD  ----- Message from Lynwood HERO Wendee, NP sent at 10/20/2023  7:54 AM EDT -----   ----- Message ----- From: Sebastian Shu, CMA Sent: 10/19/2023   1:53 PM EDT To: Lynwood HERO Wendee, NP  Called patient reviewed all information and repeated back to me. Will call if any questions.   Pt states that her cardiologist said that her cholesterol was extremely low. Pt is taking pravastatin  daily and states that she has cut out sweets and decreased carbohydrates a long time ago.  ----- Message ----- From: Wendee Lynwood HERO, NP Sent: 10/08/2023   7:35 AM EDT To: Wendee Gunnels  Notified via My Chart   See if she is taking her pravastatin  40mg  daily  ----- Message ----- From: Interface, Lab In Three Zero One Sent: 10/07/2023   2:37 PM EDT To: Lynwood HERO Wendee, NP

## 2023-10-27 DIAGNOSIS — M7061 Trochanteric bursitis, right hip: Secondary | ICD-10-CM | POA: Diagnosis not present

## 2023-10-27 DIAGNOSIS — M1611 Unilateral primary osteoarthritis, right hip: Secondary | ICD-10-CM | POA: Diagnosis not present

## 2023-11-06 ENCOUNTER — Other Ambulatory Visit: Payer: Self-pay | Admitting: Nurse Practitioner

## 2023-11-06 DIAGNOSIS — I10 Essential (primary) hypertension: Secondary | ICD-10-CM

## 2023-11-09 ENCOUNTER — Ambulatory Visit (INDEPENDENT_AMBULATORY_CARE_PROVIDER_SITE_OTHER)

## 2023-11-09 VITALS — BP 130/82 | Ht 62.5 in | Wt 162.0 lb

## 2023-11-09 DIAGNOSIS — Z0000 Encounter for general adult medical examination without abnormal findings: Secondary | ICD-10-CM

## 2023-11-12 ENCOUNTER — Other Ambulatory Visit: Payer: Self-pay

## 2023-11-12 ENCOUNTER — Emergency Department: Admission: EM | Admit: 2023-11-12 | Discharge: 2023-11-12 | Disposition: A | Discharge status: Home / Self Care

## 2023-11-12 ENCOUNTER — Emergency Department

## 2023-11-12 DIAGNOSIS — Z7901 Long term (current) use of anticoagulants: Secondary | ICD-10-CM | POA: Insufficient documentation

## 2023-11-12 DIAGNOSIS — I4891 Unspecified atrial fibrillation: Secondary | ICD-10-CM | POA: Insufficient documentation

## 2023-11-12 DIAGNOSIS — M79602 Pain in left arm: Secondary | ICD-10-CM

## 2023-11-12 DIAGNOSIS — I10 Essential (primary) hypertension: Secondary | ICD-10-CM | POA: Diagnosis not present

## 2023-11-12 DIAGNOSIS — M5412 Radiculopathy, cervical region: Secondary | ICD-10-CM | POA: Insufficient documentation

## 2023-11-12 DIAGNOSIS — R918 Other nonspecific abnormal finding of lung field: Secondary | ICD-10-CM | POA: Diagnosis not present

## 2023-11-12 DIAGNOSIS — R0789 Other chest pain: Secondary | ICD-10-CM | POA: Diagnosis not present

## 2023-11-12 LAB — COMPREHENSIVE METABOLIC PANEL WITH GFR
ALT: 14 U/L (ref 0–44)
AST: 22 U/L (ref 15–41)
Albumin: 4.4 g/dL (ref 3.5–5.0)
Alkaline Phosphatase: 43 U/L (ref 38–126)
Anion gap: 12 (ref 5–15)
BUN: 24 mg/dL — ABNORMAL HIGH (ref 8–23)
CO2: 26 mmol/L (ref 22–32)
Calcium: 9.9 mg/dL (ref 8.9–10.3)
Chloride: 99 mmol/L (ref 98–111)
Creatinine, Ser: 1.12 mg/dL — ABNORMAL HIGH (ref 0.44–1.00)
GFR, Estimated: 50 mL/min — ABNORMAL LOW (ref >60)
Glucose, Bld: 129 mg/dL — ABNORMAL HIGH (ref 70–99)
Potassium: 3.2 mmol/L — ABNORMAL LOW (ref 3.5–5.1)
Sodium: 137 mmol/L (ref 135–145)
Total Bilirubin: 0.8 mg/dL (ref 0.0–1.2)
Total Protein: 7.1 g/dL (ref 6.5–8.1)

## 2023-11-12 LAB — CBC WITH DIFFERENTIAL/PLATELET
Abs Immature Granulocytes: 0.02 K/uL (ref 0.00–0.07)
Basophils Absolute: 0 K/uL (ref 0.0–0.1)
Basophils Relative: 1 %
Eosinophils Absolute: 0.1 K/uL (ref 0.0–0.5)
Eosinophils Relative: 2 %
HCT: 41.7 % (ref 36.0–46.0)
Hemoglobin: 14 g/dL (ref 12.0–15.0)
Immature Granulocytes: 0 %
Lymphocytes Relative: 32 %
Lymphs Abs: 2.2 K/uL (ref 0.7–4.0)
MCH: 30.8 pg (ref 26.0–34.0)
MCHC: 33.6 g/dL (ref 30.0–36.0)
MCV: 91.6 fL (ref 80.0–100.0)
Monocytes Absolute: 0.4 K/uL (ref 0.1–1.0)
Monocytes Relative: 6 %
Neutro Abs: 4 K/uL (ref 1.7–7.7)
Neutrophils Relative %: 59 %
Platelets: 236 K/uL (ref 150–400)
RBC: 4.55 MIL/uL (ref 3.87–5.11)
RDW: 12 % (ref 11.5–15.5)
WBC: 6.7 K/uL (ref 4.0–10.5)
nRBC: 0 % (ref 0.0–0.2)

## 2023-11-12 LAB — TROPONIN I (HIGH SENSITIVITY)
Troponin I (High Sensitivity): 17 ng/L (ref <18)
Troponin I (High Sensitivity): 15 ng/L (ref <18)

## 2023-11-12 MED ORDER — POTASSIUM CHLORIDE CRYS ER 20 MEQ PO TBCR
40.0000 meq | EXTENDED_RELEASE_TABLET | Freq: Once | ORAL | Status: AC
  Administered 2023-11-12: 40 meq via ORAL
  Filled 2023-11-12: qty 2

## 2023-11-12 MED ORDER — GABAPENTIN 300 MG PO CAPS
300.0000 mg | ORAL_CAPSULE | Freq: Once | ORAL | Status: AC
  Administered 2023-11-12: 300 mg via ORAL
  Filled 2023-11-12: qty 1

## 2023-11-12 NOTE — ED Notes (Signed)
 Ultrasound at bedside

## 2023-11-12 NOTE — ED Triage Notes (Addendum)
 Pt to ED via POV from home. Pt reports left arm pain and chest pressure since Wednesday. Pt with hx of afib.  Pt is on blood thinner. Pt denies SOB.

## 2023-11-12 NOTE — ED Provider Notes (Signed)
 [facility] Provider Note    Event Date/Time   First MD Initiated Contact with Patient 11/12/23 1706     (approximate)   History   Chest Pain and Arm Pain   HPI  Monica Lindsey is a 78 y.o. female  with pmh atrial fibrillation on anticoagulation, chronic back pain, hypertension hyperlipidemia who presents to the emergency department with 1 week of left arm pain.  Patient states that pain developed in her left neck and radiated down to her left arm and is mobile in nature and sometimes it is her upper arm that is affected and sometimes it is her lower arm.  The pain often keeps her up at night.  Patient was googling her symptoms and became concerned today when she noticed that left arm pain could be a sign of ACS.  She denies having any chest pain previously or currently (this is in contrast to what the triage note says).  She does have a primary care physician that she follows regularly with.  Denies any sensation changes or extremity weakness. \     Physical Exam   Triage Vital Signs: ED Triage Vitals  Encounter Vitals Group     BP 11/12/23 1637 124/88     Girls Systolic BP Percentile --      Girls Diastolic BP Percentile --      Boys Systolic BP Percentile --      Boys Diastolic BP Percentile --      Pulse Rate 11/12/23 1637 85     Resp 11/12/23 1637 18     Temp 11/12/23 1639 98.1 F (36.7 C)     Temp Source 11/12/23 1639 Oral     SpO2 11/12/23 1637 98 %     Weight --      Height --      Head Circumference --      Peak Flow --      Pain Score 11/12/23 1637 8     Pain Loc --      Pain Education --      Exclude from Growth Chart --     Most recent vital signs: Vitals:   11/12/23 1639 11/12/23 1959  BP:  (!) 147/70  Pulse:  (!) 58  Resp:  18  Temp: 98.1 F (36.7 C) 98.4 F (36.9 C)  SpO2:  97%    Nursing Triage Note reviewed. Vital signs reviewed and patients oxygen saturation is normoxic  General: Patient is well nourished, well  developed, awake and alert, resting comfortably in no acute distress Head: Normocephalic and atraumatic Eyes: Normal inspection, extraocular muscles intact, no conjunctival pallor Ear, nose, throat: Normal external exam Neck: Normal range of motion No C-spine tenderness to palpation however patient does have pain over left trapezius muscle Respiratory: Patient is in no respiratory distress, lungs CTAB Cardiovascular: Patient is not tachycardic, RRR without murmur appreciated GI: Abd SNT with no guarding or rebound  Back: Normal inspection of the back with good strength and range of motion throughout all ext Extremities: pulses intact with good cap refills, no LE pitting edema or calf tenderness No acute abnormality observed over the left upper extremity full range of motion, no swelling and no erythema 2+ radial pulse Neuro: The patient is alert and oriented to person, place, and time, appropriately conversive, with 5/5 bilat UE/LE strength, no gross motor or sensory defects noted. Coordination appears to be adequate. Skin: Warm, dry, and intact Psych: normal mood and affect, no SI or  HI  ED Results / Procedures / Treatments   Labs (all labs ordered are listed, but only abnormal results are displayed) Labs Reviewed  COMPREHENSIVE METABOLIC PANEL WITH GFR - Abnormal; Notable for the following components:      Result Value   Potassium 3.2 (*)    Glucose, Bld 129 (*)    BUN 24 (*)    Creatinine, Ser 1.12 (*)    GFR, Estimated 50 (*)    All other components within normal limits  CBC WITH DIFFERENTIAL/PLATELET  TROPONIN I (HIGH SENSITIVITY)  TROPONIN I (HIGH SENSITIVITY)     EKG EKG and rhythm strip are interpreted by myself:   EKG: [Normal sinus rhythm] at heart rate of 80, normal QRS duration, QTc 447, nonspecific ST segments and T waves no ectopy EKG not consistent with Acute STEMI Rhythm strip: Normal sinus rhythm in lead II   RADIOLOGY Chest x-ray: No acute abnormalities  on my independent review interpretation, radiologist read this as emphysematous changes Doppler ultrasound of left upper extremity: No acute abnormality/no DVT    PROCEDURES:  Critical Care performed: No  Procedures   MEDICATIONS ORDERED IN ED: Medications  gabapentin  (NEURONTIN ) capsule 300 mg (300 mg Oral Given 11/12/23 1816)  potassium chloride SA (KLOR-CON M) CR tablet 40 mEq (40 mEq Oral Given 11/12/23 1958)     IMPRESSION / MDM / ASSESSMENT AND PLAN / ED COURSE                                Differential diagnosis includes, but is not limited to, DVT, radiculopathy, muscle spasm, less likely atypical ACS or pneumonia  ED course: Patient is well-appearing and reassured by the chronicity of symptoms and lack of chest pain.  EKG demonstrated no evidence of acute ischemia.  Troponin x 2 was not elevated.  She had no leukocytosis.  Her potassium was mildly low and this was repleted but she had no other electrolyte derangements.  Doppler ultrasound of the left upper extremity was unremarkable and so was the chest x-ray.  Given history and exam I do think he will presentation is consistent with a radiculopathy of which she is at risk for given her history of degenerative changes of the spine.  She has no focal neurological deficits on exam however I do not think she meets criteria at this time for an emergent MRI.  She was given a dose of gabapentin  with good relief.  I offered to prescribe this to the patient however patient and daughter would prefer to wait until discussed this with her primary care physician on Monday.  I counseled gentle stretches physical therapy, and possible follow-up with a neurosurgeon of which I provided a number.  All questions answered and patient daughter voiced understanding and requested discharge  Clinical Course as of 11/12/23 2358  Fri Nov 12, 2023  1723 Troponin I (High Sensitivity): 17 Not elevated [HD]  1810 DG Chest 2 View Emphysema but no acute  abnormality on my independent review interpretation [HD]  1839 US  Venous Img Upper Uni Left No DVT [HD]  1953 Troponin I (High Sensitivity): 15 Repeat not elevated [HD]  2002 Discussed workup with patient and daughter.  They are reassured.  Patient and daughter would prefer to hold off on any prescriptions until they can talk to their primary care physician.  I will give the number to neurosurgery.  All questions answered and patient voiced understanding and requested discharge [HD]  Clinical Course User Index [HD] Nicholaus Rolland BRAVO, MD   -- Risk: 5 This patient has a high risk of morbidity due to further diagnostic testing or treatment. Rationale: This patient's evaluation and management involve a high risk of morbidity due to the potential severity of presenting symptoms, need for diagnostic testing, and/or initiation of treatment that may require close monitoring. The differential includes conditions with potential for significant deterioration or requiring escalation of care. Treatment decisions in the ED, including medication administration, procedural interventions, or disposition planning, reflect this level of risk. COPA: 5 The patient has the following acute or chronic illness/injury that poses a possible threat to life or bodily function: [X] : The patient has a potentially serious acute condition or an acute exacerbation of a chronic illness requiring urgent evaluation and management in the Emergency Department. The clinical presentation necessitates immediate consideration of life-threatening or function-threatening diagnoses, even if they are ultimately ruled out.   FINAL CLINICAL IMPRESSION(S) / ED DIAGNOSES   Final diagnoses:  Left arm pain  Cervical radiculopathy     Rx / DC Orders   ED Discharge Orders     None        Note:  This document was prepared using Dragon voice recognition software and may include unintentional dictation errors.   Nicholaus Rolland BRAVO,  MD 11/12/23 [phone]

## 2023-11-12 NOTE — ED Notes (Signed)
 Trop sent

## 2023-11-12 NOTE — Discharge Instructions (Signed)

## 2023-11-15 ENCOUNTER — Encounter: Payer: Self-pay | Admitting: Cardiology

## 2023-11-15 ENCOUNTER — Ambulatory Visit: Payer: Self-pay

## 2023-11-15 ENCOUNTER — Ambulatory Visit: Attending: Cardiology | Admitting: Cardiology

## 2023-11-15 VITALS — BP 130/70 | HR 77 | Ht 62.0 in | Wt 164.6 lb

## 2023-11-15 DIAGNOSIS — I429 Cardiomyopathy, unspecified: Secondary | ICD-10-CM

## 2023-11-15 DIAGNOSIS — I4819 Other persistent atrial fibrillation: Secondary | ICD-10-CM

## 2023-11-15 DIAGNOSIS — I10 Essential (primary) hypertension: Secondary | ICD-10-CM

## 2023-11-15 NOTE — Telephone Encounter (Signed)
 Noted

## 2023-11-15 NOTE — Telephone Encounter (Signed)
 Appointment made for 11/18/2023 with Lynwood Crandall NP at 11am Cervical radiculopathy diagnosed in the ER on 11/12/2023 3-4 years with a neck pillow  FYI Only or Action Required?: FYI only for provider.  Patient was last seen in primary care on 10/07/2023 by Crandall Lynwood HERO, NP.  Called Nurse Triage reporting Neck Pain.  Symptoms began several years ago.  Interventions attempted: Rest, hydration, or home remedies.  Symptoms are: gradually worsening.  Triage Disposition: See PCP When Office is Open (Within 3 Days)  Patient/caregiver understands and will follow disposition?: Yes              Copied from CRM [id number]. Topic: Clinical - Red Word Triage >> Nov 15, 2023 12:05 PM Nessti S wrote: [facility] that prompted transfer to Nurse Triage: was diagnosed with cervical radiculopathy pain in neck, wakes in the middle of night and seems like she is on fire Reason for Disposition . [1] MODERATE neck pain (e.g., interferes with normal activities) AND [2] present > 3 days  Answer Assessment - Initial Assessment Questions Patient states she has coped with her neck pain for the past 3-4 years Patient states she had back surgery in 1983 Patient went to the ER 11/12/2023 and diagnosed with cervical radiculopathy and advised to follow up with her PCP She states no changes since ER visit Patient is advised to call us  back if anything changes or with any further questions/concerns.    1. ONSET: When did the pain begin?      3-4 years ago 2. LOCATION: Where does it hurt?      neck 3. PATTERN Does the pain come and go, or has it been constant since it started?      Comes and goes---patient states she sleeps with a neck pillow or a towel rolled up again her neck 4. SEVERITY: How bad is the pain?  (Scale 0-10; or none or slight stiffness, mild, moderate, severe)     Varies--radiates down to hand at times 5. RADIATION: Does the pain go anywhere else, shoot into your arms?      Down arm ---this was assessed in the ER on 11/12/2023 6. CORD SYMPTOMS: Any weakness or numbness of the arms or legs?     ----- 7. CAUSE: What do you think is causing the neck pain?     Ongoing issue 8. NECK OVERUSE: Any recent activities that involved turning or twisting the neck?     Just daily use 9. OTHER SYMPTOMS: Do you have any other symptoms? (e.g., headache, fever, chest pain, difficulty breathing, neck swelling)     Recently assessed at ER 11/12/2023  Protocols used: Neck Pain or Stiffness-A-AH

## 2023-11-15 NOTE — Progress Notes (Signed)
 Cardiology Office Note:    Date:  11/15/2023   ID:  Monica Lindsey, DOB [birth year], MRN [id number]  PCP:  Wendee Lynwood HERO, NP   [facility] Providers Cardiologist:  Redell Cave, MD Electrophysiologist:  OLE ONEIDA HOLTS, MD     Referring MD: Wendee Lynwood HERO, NP   Chief Complaint  Patient presents with   Follow-up    ED follow up pt has been doing well with no complaints of chest pain, chest pressure or SOB, medciation reviewed verbally with patient    History of Present Illness:    Monica Lindsey is a 78 y.o. female with a hx of persistent atrial fibrillation s/p DC cardioversion x 2 (3/24, 12/24), hypertension, hyperlipidemia presents for follow-up.  Denies chest pain or palpitations, feels well from a cardiac perspective.  Complains of lower back pain, neck pain, left arm numbness.  Has appointment with neurosurgery for evaluation.  Compliant with medications as prescribed.  Denies any bleeding issues with Eliquis .   Prior notes Echo 08/2022 EF 50 to 55% Echo 03/2022 EF 35 to 40% Coronary calcium score 02/2022, calcium score 0. Previously on amiodarone  which was stopped due to thyroid  dysfunction.  Past Medical History:  Diagnosis Date   Arthritis    Constipation    Frequent headaches    Hay fever    Heel spur    Hyperlipidemia    Hypertension    Persistent atrial fibrillation (HCC)    SOB (shortness of breath) 01/25/2023    Past Surgical History:  Procedure Laterality Date   ABDOMINAL HYSTERECTOMY     parialt ovaries intact   BACK SURGERY  02/02/1981   CARDIOVERSION N/A 03/04/2022   Procedure: CARDIOVERSION;  Surgeon: Cave Redell, MD;  Location: [facility];  Service: Cardiovascular;  Laterality: N/A;   CARDIOVERSION N/A 04/08/2022   Procedure: CARDIOVERSION;  Surgeon: Cave Redell, MD;  Location: [facility];  Service: Cardiovascular;  Laterality: N/A;   CARDIOVERSION N/A 01/25/2023   Procedure: CARDIOVERSION;  Surgeon: Perla Evalene PARAS,  MD;  Location: [facility];  Service: Cardiovascular;  Laterality: N/A;   CARPOMETACARPAL (CMC) FUSION OF THUMB Right 04/07/2017   Procedure: CARPOMETACARPAL (CMC) FUSION OF THUMB;  Surgeon: Cleotilde Barrio, MD;  Location: [facility];  Service: Orthopedics;  Laterality: Right;   COLONOSCOPY     COLONOSCOPY WITH PROPOFOL  N/A 09/10/2020   Procedure: COLONOSCOPY WITH PROPOFOL ;  Surgeon: Jinny Carmine, MD;  Location: [facility];  Service: Endoscopy;  Laterality: N/A;   HEMORRHOID SURGERY     TOOTH EXTRACTION      Current Medications: Current Meds  Medication Sig   amLODipine  (NORVASC ) 5 MG tablet Take 1 tablet (5 mg total) by mouth daily.   apixaban  (ELIQUIS ) 5 MG TABS tablet Take 1 tablet (5 mg total) by mouth 2 (two) times daily.   cetirizine  (ZYRTEC ) 10 MG tablet Take 1 tablet (10 mg total) by mouth daily. (Patient taking differently: Take 10 mg by mouth daily as needed for allergies or rhinitis.)   Cyanocobalamin  (VITAMIN B-12 PO) Place 1 tablet under the tongue daily.   fluticasone  (FLONASE ) 50 MCG/ACT nasal spray Place 2 sprays into both nostrils daily. (Patient taking differently: Place 2 sprays into both nostrils daily as needed for allergies or rhinitis.)   hydrochlorothiazide  (HYDRODIURIL ) 25 MG tablet TAKE 1 TABLET BY MOUTH DAILY   metoprolol  tartrate (LOPRESSOR ) 25 MG tablet Take 0.5tablets (12.5 mg total) by mouth 2 (two) times daily.   Multiple Vitamin (MULTIVITAMIN) tablet Take 1 tablet by mouth daily.  Omega-3 Fatty Acids (FISH OIL) 1000 MG CAPS Take 1,000 mg by mouth daily.   polyethylene glycol (MIRALAX  / GLYCOLAX ) 17 g packet Take 17 g by mouth daily. (Patient taking differently: Take 17 g by mouth daily as needed for mild constipation or moderate constipation.)   pravastatin  (PRAVACHOL ) 80 MG tablet Take 1 tablet (80 mg total) by mouth every evening.   Vitamin D , Cholecalciferol, 25 MCG (1000 UT) TABS Take 1,000 Units by mouth daily.     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  Very close to her 2 children and 2 grand children.Daughter, Clotilda, is HPOA.  Has a living will.Would desire CPR, would not desire prolonged life support if futile.   2 living chiildren    Social Drivers of Health   Financial Resource Strain: Low Risk  (08/04/2022)   Overall Financial Resource Strain (CARDIA)    Difficulty of Paying Living Expenses: Not hard at all  Food Insecurity: No Food Insecurity (11/09/2023)   Hunger Vital Sign    Worried About Running Out of Food in the Last Year: Never true    Ran Out of Food in the Last Year: Never true  Transportation Needs: No Transportation Needs (11/09/2023)   PRAPARE - Administrator, Civil Service (Medical): No    Lack of Transportation (Non-Medical): No  Physical Activity: Insufficiently Active (11/09/2023)   Exercise Vital Sign    Days of Exercise per Week: 3 days    Minutes of Exercise per Session: 10 min  Stress: No Stress Concern Present (11/09/2023)   [employer] of Occupational Health - Occupational Stress Questionnaire    Feeling of Stress: Not at all  Social Connections: Moderately Integrated (11/09/2023)   Social Connection and Isolation Panel    Frequency of Communication with Friends and Family: More than three times a week    Frequency of Social Gatherings with Friends and Family: Once a  week    Attends Religious Services: More than 4 times per year    Active Member of [employer] or Organizations: Yes    Attends Banker Meetings: More than 4 times per year    Marital Status: Widowed     Family History: The patient's family history includes Alcohol abuse in her father; Arthritis in her father; Brain cancer in her sister; Breast cancer in her cousin; Breast cancer (age of onset: 31) in her paternal aunt; Heart disease in her father and mother; Hypertension in her father; Stroke in her father.  ROS:   Please see the history of present illness.     All other systems reviewed and are negative.  EKGs/Labs/Other Studies Reviewed:    The following studies were reviewed today:        Recent Labs:  10/07/2023: TSH 1.06 11/12/2023: ALT 14; BUN 24; Creatinine, Ser 1.12; Hemoglobin 14.0; Platelets 236; Potassium 3.2; Sodium 137  Recent Lipid Panel    Component Value Date/Time   CHOL 277 (H) 10/07/2023 1011   TRIG 92.0 10/07/2023 1011   HDL 67.50 10/07/2023 1011   CHOLHDL 4 10/07/2023 1011   VLDL 18.4 10/07/2023 1011   LDLCALC 191 (H) 10/07/2023 1011   LDLDIRECT 127.3 03/23/2013 1459     Risk Assessment/Calculations:             Physical Exam:    VS:  BP 130/70 (BP Location: Left Arm, Patient Position: Sitting, Cuff Size: Normal)   Pulse 77   Ht 5' 2 (1.575 m)   Wt 164 lb 9.6 oz (74.7 kg)   SpO2 97%   BMI 30.11 kg/m     Wt Readings from Last 3 Encounters:  11/15/23 164 lb 9.6 oz (74.7 kg)  11/09/23 162 lb (73.5 kg)  10/07/23 168 lb 3.2 oz (76.3 kg)     GEN:  Well nourished, well developed in no acute distress HEENT: Normal NECK: No JVD; No carotid bruits CARDIAC: Regular rate and rhythm RESPIRATORY:  Clear to auscultation without rales, wheezing or rhonchi  ABDOMEN: Soft, non-tender, non-distended MUSCULOSKELETAL:  No edema; No deformity  SKIN: Warm and dry NEUROLOGIC:  Alert and oriented x 3 PSYCHIATRIC:  Normal affect   ASSESSMENT:     1. Persistent atrial fibrillation (HCC)   2. Primary hypertension   3. Cardiomyopathy, unspecified type (HCC)     PLAN:    In order of problems listed above:  Persistent A-fib/flutter s/p DC cardioversion x 2 (3/24, 12/24).  Heart rate appears regular on exam.  Continue Lopressor  12.5 mg twice daily, Eliquis  5 mg twice daily.  Catheter ablation will be considered if a flutter/symptoms return.  Amiodarone  previously stopped due to thyroid  dysfunction.. Hypertension,  BP controlled.  Continue Lopressor  12.5 mg twice daily, Norvasc  5 mg daily, HCTZ 25 mg daily.  Cardiomyopathy, initial echo 2/24 EF 35 to 40%, possibly tachycardia induced.  Repeat echo 7/24 EF 50 to 55%.    Follow-up in 12 months         Medication Adjustments/Labs and Tests Ordered: Current medicines are reviewed at length with the patient today.  Concerns regarding medicines are outlined above.  No orders of the defined types were placed in this encounter.  No orders of the defined types were placed in this encounter.   Patient Instructions  Medication Instructions:   Your physician recommends that you continue on your current medications as directed. Please refer to the Current Medication list given to you today.    *If you need a refill on your cardiac medications before your next appointment, please call your pharmacy*  Lab Work:  None ordered at this time   If you have labs (blood work) drawn today and your tests are completely normal, you will receive your results only by:  MyChart Message (if you have MyChart) OR  A paper copy in the mail If you have any lab test that is abnormal or we need to change your treatment, we will call you to review the results.  Testing/Procedures:  None ordered at this time   Referrals:  None ordered at this time   Follow-Up:  At [facility], you and your health needs are our priority.  As part of our continuing mission to provide you with exceptional  heart care, our providers are all part of one team.  This team includes your primary Cardiologist (physician) and Advanced Practice Providers or APPs (Physician Assistants and Nurse Practitioners) who all work together to provide you with the care you need, when you need it.  Your next appointment:   1 year(s)  Provider:    You may see Redell Cave, MD or one of the following Advanced Practice Providers on your designated Care Team:   Lonni Meager, NP Lesley Maffucci, PA-C Bernardino Bring, PA-C [place], PA-C Tylene Lunch, NP Barnie Hila, NP    We recommend signing up for the patient portal called MyChart.  Sign up information is provided on this After Visit Summary.  MyChart is used to connect with patients for Virtual Visits (Telemedicine).  Patients are able to view lab/test results, encounter notes, upcoming appointments, etc.  Non-urgent messages can be sent to your provider as well.   To learn more about what you can do with MyChart, go to [email or web address].     Signed, Redell Cave, MD  11/15/2023 3:23 PM    [facility]

## 2023-11-15 NOTE — Patient Instructions (Signed)
 Medication Instructions:   Your physician recommends that you continue on your current medications as directed. Please refer to the Current Medication list given to you today.    *If you need a refill on your cardiac medications before your next appointment, please call your pharmacy*  Lab Work:  None ordered at this time   If you have labs (blood work) drawn today and your tests are completely normal, you will receive your results only by:  MyChart Message (if you have MyChart) OR  A paper copy in the mail If you have any lab test that is abnormal or we need to change your treatment, we will call you to review the results.  Testing/Procedures:  None ordered at this time   Referrals:  None ordered at this time   Follow-Up:  At [facility], you and your health needs are our priority.  As part of our continuing mission to provide you with exceptional heart care, our providers are all part of one team.  This team includes your primary Cardiologist (physician) and Advanced Practice Providers or APPs (Physician Assistants and Nurse Practitioners) who all work together to provide you with the care you need, when you need it.  Your next appointment:   1 year(s)  Provider:    You may see Redell Cave, MD or one of the following Advanced Practice Providers on your designated Care Team:   Lonni Meager, NP Lesley Maffucci, PA-C Bernardino Bring, PA-C Cadence [place], PA-C Tylene Lunch, NP Barnie Hila, NP    We recommend signing up for the patient portal called MyChart.  Sign up information is provided on this After Visit Summary.  MyChart is used to connect with patients for Virtual Visits (Telemedicine).  Patients are able to view lab/test results, encounter notes, upcoming appointments, etc.  Non-urgent messages can be sent to your provider as well.   To learn more about what you can do with MyChart, go to [email or web address].

## 2023-11-16 NOTE — Progress Notes (Unsigned)
 Referring Physician:  Wendee Lynwood HERO, NP [address] [place],  [place] [address]  Primary Physician:  Wendee Lynwood HERO, NP  History of Present Illness: 11/24/2023 Monica Lindsey has a history of HTN, afib, peripheral neuropathy, hyperlipidemia, obesity, prediabetes.   Seen in ED on 11/12/23 for neck and left arm pain.   She has 2-3 week history of constant pain in her neck with left arm pain to top of her hand. No right arm pain. Pain in arm can be burning. She has numbness, tingling, and weakness in left arm. Minimal relief with laying on her left side. No specific aggravating factors.   She has lumbar surgery in 1983- she's had chronic LBP since that time. She has constant LBP with constant left posterior leg pain to her foot. She has tingling in left leg with weakness. No right leg pain. Pain is worse with prolonged standing, sitting, or walking. Some relief with massage of her left heel.   Was given steroids on 11/18/23- this helped a little bit.   She is on ELIQUIS .   Tobacco use: Does not smoke.   Bowel/Bladder Dysfunction: none  Conservative measures:  Physical therapy: did PT years ago Multimodal medical therapy including regular antiinflammatories:  Gabapentin   Injections:   no epidural steroid injections  Past Surgery:  back surgery L4-L5 in 1983  Monica Lindsey has no symptoms of cervical myelopathy.  The symptoms are causing a significant impact on the patient's life.   Review of Systems:  A 10 point review of systems is negative, except for the pertinent positives and negatives detailed in the HPI.  Past Medical History: Past Medical History:  Diagnosis Date   Arthritis    Constipation    Frequent headaches    Hay fever    Heel spur    Hyperlipidemia    Hypertension    Persistent atrial fibrillation (HCC)    SOB (shortness of breath) 01/25/2023    Past Surgical History: Past Surgical History:  Procedure Laterality Date   ABDOMINAL  HYSTERECTOMY     parialt ovaries intact   BACK SURGERY  02/02/1981   CARDIOVERSION N/A 03/04/2022   Procedure: CARDIOVERSION;  Surgeon: Darliss Rogue, MD;  Location: [facility];  Service: Cardiovascular;  Laterality: N/A;   CARDIOVERSION N/A 04/08/2022   Procedure: CARDIOVERSION;  Surgeon: Darliss Rogue, MD;  Location: [facility];  Service: Cardiovascular;  Laterality: N/A;   CARDIOVERSION N/A 01/25/2023   Procedure: CARDIOVERSION;  Surgeon: Perla Evalene PARAS, MD;  Location: [facility];  Service: Cardiovascular;  Laterality: N/A;   CARPOMETACARPAL (CMC) FUSION OF THUMB Right 04/07/2017   Procedure: CARPOMETACARPAL (CMC) FUSION OF THUMB;  Surgeon: Cleotilde Barrio, MD;  Location: [facility];  Service: Orthopedics;  Laterality: Right;   COLONOSCOPY     COLONOSCOPY WITH PROPOFOL  N/A 09/10/2020   Procedure: COLONOSCOPY WITH PROPOFOL ;  Surgeon: Jinny Carmine, MD;  Location: [facility];  Service: Endoscopy;  Laterality: N/A;   HEMORRHOID SURGERY     TOOTH EXTRACTION      Allergies: Allergies as of 11/24/2023 - Review Complete 11/24/2023  Allergen Reaction Noted   Demerol [meperidine] Other (See Comments) 03/23/2013    Medications: Outpatient Encounter Medications as of 11/24/2023  Medication Sig   amLODipine  (NORVASC ) 5 MG tablet Take 1 tablet (5 mg total) by mouth daily.   apixaban  (ELIQUIS ) 5 MG TABS tablet Take 1 tablet (5 mg total) by mouth 2 (two) times daily.   cetirizine  (ZYRTEC ) 10 MG tablet Take 1 tablet (10  mg total) by mouth daily. (Patient taking differently: Take 10 mg by mouth daily as needed for allergies or rhinitis.)   Cyanocobalamin  (VITAMIN B-12 PO) Place 1 tablet under the tongue daily.   fluticasone  (FLONASE ) 50 MCG/ACT nasal spray Place 2 sprays into both nostrils daily. (Patient taking differently: Place 2 sprays into both nostrils daily as needed for allergies or rhinitis.)   gabapentin  (NEURONTIN ) 100 MG capsule Take 1-3 capsules (100-300 mg total) by mouth at  bedtime.   hydrochlorothiazide  (HYDRODIURIL ) 25 MG tablet TAKE 1 TABLET BY MOUTH DAILY   methocarbamol (ROBAXIN) 500 MG tablet TAKE (1) TABLET TWICE A DAY.   metoprolol  tartrate (LOPRESSOR ) 25 MG tablet Take 0.5tablets (12.5 mg total) by mouth 2 (two) times daily.   Multiple Vitamin (MULTIVITAMIN) tablet Take 1 tablet by mouth daily.   Omega-3 Fatty Acids (FISH OIL) 1000 MG CAPS Take 1,000 mg by mouth daily.   polyethylene glycol (MIRALAX  / GLYCOLAX ) 17 g packet Take 17 g by mouth daily. (Patient taking differently: Take 17 g by mouth daily as needed for mild constipation or moderate constipation.)   pravastatin  (PRAVACHOL ) 80 MG tablet Take 1 tablet (80 mg total) by mouth every evening.   predniSONE  (DELTASONE ) 20 MG tablet Take 2 tablets (40 mg total) by mouth daily with breakfast.   Vitamin D , Cholecalciferol, 25 MCG (1000 UT) TABS Take 1,000 Units by mouth daily.   No facility-administered encounter medications on file as of 11/24/2023.    Social History: Social History   Tobacco Use   Smoking status: Never    Passive exposure: Past   Smokeless tobacco: Never  Vaping Use   Vaping status: Never Used  Substance Use Topics   Alcohol use: Not Currently   Drug use: No    Family Medical History: Family History  Problem Relation Age of Onset   Heart disease Mother    Alcohol abuse Father    Arthritis Father    Heart disease Father    Stroke Father    Hypertension Father    Brain cancer Sister    Breast cancer Paternal Aunt 84   Breast cancer Cousin     Physical Examination: Vitals:   11/24/23 1415  BP: 116/76    General: Patient is well developed, well nourished, calm, collected, and in no apparent distress. Attention to examination is appropriate.  Respiratory: Patient is breathing without any difficulty.   NEUROLOGICAL:     Awake, alert, oriented to person, place, and time.  Speech is clear and fluent. Fund of knowledge is appropriate.   Cranial Nerves: Pupils  equal round and reactive to light.  Facial tone is symmetric.    No posterior cervical tenderness. No tenderness in bilateral trapezial region.   Mild lower right sided posterior lumbar tenderness.   No abnormal lesions on exposed skin.   Strength: Side Biceps Triceps Deltoid Interossei Grip Wrist Ext. Wrist Flex.  R 5 5 5 5 5 5 5   L 5 5 5 5 5 5 5    Side Iliopsoas Quads Hamstring PF DF EHL  R 5 5 5 5 5 5   L 5 5 5 5 5 5    Reflexes are 2+ and symmetric at the biceps, brachioradialis, patella and achilles.   Hoffman's is absent.  Clonus is not present.   Bilateral upper and lower extremity sensation is intact to light touch.     No pain with IR/ER of her hips.   Gait is slow.   Medical Decision Making  Imaging: Cervical  CT scan dated 07/23/23:  CT CERVICAL SPINE FINDINGS   Alignment: Straightening of the normal cervical lordosis. 3 mm anterolisthesis of C4 on C5 likely related to facet degenerative changes. No facet dislocation.   Skull base and vertebrae: No acute fracture. No primary bone lesion or focal pathologic process.   Soft tissues and spinal canal: No prevertebral fluid or swelling. No visible canal hematoma. Multiple thyroid  nodules measuring up to 2.2 cm.   Disc levels: Intervertebral disc space narrowing most pronounced at C3-4 and C5-6. Disc osteophyte complex at C3-4 eccentric to the right resulting in mild-to-moderate spinal canal stenosis. Additional disc osteophyte complex at C5-6 resulting in moderate spinal canal stenosis. Facet arthrosis and uncovertebral hypertrophy at multiple levels with severe foraminal narrowing noted on the right at C3-4 and bilaterally at C4-5 and C5-6.   Upper chest: Negative.   Other: None.   IMPRESSION: No acute fracture or traumatic malalignment of the cervical spine.   Degenerative changes of the cervical spine as above.     Electronically Signed   By: Donnice Mania M.D.   On: 07/23/2023 16:18  I have  personally reviewed the images and agree with the above interpretation.  Assessment and Plan: Ms. Femia  2-3 week history of constant pain in her neck with left arm pain to top of her hand. No right arm pain. She has numbness, tingling, and weakness in left arm.   She has known cervical spondylosis with central stenosis C3-C4 and C5-C6 and multilevel foraminal stenosis.   She has lumbar surgery in 1983- she's had chronic LBP since that time.   She has constant LBP with constant left posterior leg pain to her foot. She has tingling in left leg with weakness. No right leg pain. No recent lumbar imaging.   Per patient, LBP and left leg pain > neck and left arm pain.   Treatment options discussed with patient and following plan made:   - MRI of cervical spine to further evaluate neck and left arm pain along with spinal stenosis seen on previous CT scan.  - MRI of lumbar spine to further evaluate LBP and left leg pain.  - Depending on MRI results, may consider PT and/or injections.  - Will schedule follow up visit to review MRI results once I get them back.   I spent a total of 35 minutes in face-to-face and non-face-to-face activities related to this patient's care today including review of outside records, review of imaging, review of symptoms, physical exam, discussion of differential diagnosis, discussion of treatment options, and documentation.   Thank you for involving me in the care of this patient.   Glade Boys PA-C Dept. of Neurosurgery

## 2023-11-18 ENCOUNTER — Ambulatory Visit (INDEPENDENT_AMBULATORY_CARE_PROVIDER_SITE_OTHER): Admitting: Nurse Practitioner

## 2023-11-18 VITALS — BP 124/80 | HR 62 | Temp 97.6°F | Ht 62.0 in | Wt 165.2 lb

## 2023-11-18 DIAGNOSIS — M5412 Radiculopathy, cervical region: Secondary | ICD-10-CM | POA: Diagnosis not present

## 2023-11-18 DIAGNOSIS — Z09 Encounter for follow-up examination after completed treatment for conditions other than malignant neoplasm: Secondary | ICD-10-CM

## 2023-11-18 MED ORDER — PREDNISONE 20 MG PO TABS
40.0000 mg | ORAL_TABLET | Freq: Every day | ORAL | 0 refills | Status: AC
Start: 2023-11-18 — End: ?

## 2023-11-18 MED ORDER — GABAPENTIN 100 MG PO CAPS: 100.0000 mg | ORAL_CAPSULE | Freq: Every day | ORAL | 0 refills | Status: AC

## 2023-11-18 NOTE — Patient Instructions (Signed)
 Nice to see you today  Start with 1 capsule of gabapentin  at bedtime. If that is not enough and you tolerate it well you can take 2 the following night. No more than 3 capsules at bedtime  Take the prednisone  with food in the morning Follow up with me as scheduled

## 2023-11-18 NOTE — Progress Notes (Signed)
 Established Patient Office Visit  Subjective   Patient ID: Monica Lindsey, female    DOB: Jun 19, 1945  Age: 78 y.o. MRN: [id number]  Chief Complaint  Patient presents with   Hospitalization Follow-up    Pt complains of pain getting a little better but had trouble sleeping at night due to pain.     HPI   Discussed the use of AI scribe software for clinical note transcription with the patient, who gave verbal consent to proceed.  History of Present Illness Monica Lindsey is a 78 year old female who presents with persistent left-sided pain and numbness.  She has been experiencing persistent left-sided pain and numbness for approximately two weeks. Initially, she sought care at the emergency department a week ago, where a full cardiac workup was performed, and she was told her heart looked okay. She was administered gabapentin , which provided some relief, and potassium due to low levels. Despite the initial improvement, she chose not to continue gabapentin  at that time.  The pain is described as 'horrendous' and was particularly severe last Friday night, prompting her to seek emergency care. She experiences constant numbness and tingling on the top of her left hand, which is bothersome and accompanied by a sensation of coldness. The pain is persistent, with exacerbations at night, often waking her up and preventing her from returning to sleep.  She has a history of back issues dating back to 1983, when she was diagnosed with two ruptured discs at L4 and L5 after a prolonged period of being dismissed by medical professionals. This history includes a significant surgical intervention and a lengthy recovery period. She reports a longstanding heel spur and has been dealing with neck pain for about ten years, which she manages with a neck pillow and by sleeping in a recliner.   She reports difficulty performing household tasks due to her symptoms, which impact her ability to stand for extended  periods, such as when washing dishes.    Review of Systems  Constitutional:  Negative for chills and fever.  Respiratory:  Negative for shortness of breath.   Cardiovascular:  Negative for chest pain.  Neurological:  Positive for weakness. Negative for headaches.  Psychiatric/Behavioral:  Negative for hallucinations and suicidal ideas.       Objective:     BP 124/80   Pulse 62   Temp 97.6 F (36.4 C) (Oral)   Ht 5' 2 (1.575 m)   Wt 165 lb 3.2 oz (74.9 kg)   SpO2 98%   BMI 30.22 kg/m  BP Readings from Last 3 Encounters:  11/18/23 124/80  11/15/23 130/70  11/12/23 (!) 147/70   Wt Readings from Last 3 Encounters:  11/18/23 165 lb 3.2 oz (74.9 kg)  11/15/23 164 lb 9.6 oz (74.7 kg)  11/09/23 162 lb (73.5 kg)   SpO2 Readings from Last 3 Encounters:  11/18/23 98%  11/15/23 97%  11/12/23 97%      Physical Exam Vitals and nursing note reviewed.  Constitutional:      Appearance: Normal appearance.  Cardiovascular:     Rate and Rhythm: Normal rate and regular rhythm.     Heart sounds: Normal heart sounds.  Pulmonary:     Effort: Pulmonary effort is normal.     Breath sounds: Normal breath sounds.  Musculoskeletal:        General: Tenderness present.       Arms:  Neurological:     Mental Status: She is alert.     Deep  Tendon Reflexes:     Reflex Scores:      Bicep reflexes are 2+ on the right side and 1+ on the left side.    Comments: Bilateral upper extremity strength 5/5      No results found for any visits on 11/18/23.    The 10-year ASCVD risk score (Arnett DK, et al., 2019) is: 29.9%    Assessment & Plan:   Problem List Items Addressed This Visit       Other   Hospital discharge follow-up   Other Visit Diagnoses       Cervical radiculopathy    -  Primary   Relevant Medications   methocarbamol (ROBAXIN) 500 MG tablet   predniSONE  (DELTASONE ) 20 MG tablet   gabapentin  (NEURONTIN ) 100 MG capsule     Assessment and Plan Assessment &  Plan Cervical radiculopathy with left upper extremity symptoms cervical radiculopathy with left upper extremity symptoms, constant numbness and tingling in the left hand, worsened at night. Severe foraminal narrowing on CT suggests nerve compression. Gabapentin  provided some relief previously. - Prescribed gabapentin  100 mg at bedtime, with option to increase to 200 mg or 300 mg if needed. - Consider short course of prednisone  to reduce nerve inflammation, if tolerated. - Continue follow-up with neurosurgery for further evaluation and potential MRI if symptoms do not improve. -reviewed ED note, labs, and imaging  Spinal stenosis with lower extremity symptoms Chronic spinal stenosis with lower extremity symptoms, including intermittent leg weakness and pain, likely due to spinal canal narrowing. Significant concern given history of lumbar issues. - Continue follow-up with neurosurgery for further evaluation and potential imaging if symptoms persist or worsen.  Atrial fibrillation Atrial fibrillation diagnosed in 2023, managed with apixaban .  - Continue apixaban  as prescribed.   Return if symptoms worsen or fail to improve, for As scheduled .    Adina Crandall, NP

## 2023-11-24 ENCOUNTER — Ambulatory Visit (INDEPENDENT_AMBULATORY_CARE_PROVIDER_SITE_OTHER): Admitting: Orthopedic Surgery

## 2023-11-24 ENCOUNTER — Encounter: Payer: Self-pay | Admitting: Orthopedic Surgery

## 2023-11-24 VITALS — BP 116/76 | Wt 168.2 lb

## 2023-11-24 DIAGNOSIS — G8929 Other chronic pain: Secondary | ICD-10-CM

## 2023-11-24 DIAGNOSIS — M79605 Pain in left leg: Secondary | ICD-10-CM

## 2023-11-24 DIAGNOSIS — M5416 Radiculopathy, lumbar region: Secondary | ICD-10-CM

## 2023-11-24 DIAGNOSIS — M545 Low back pain, unspecified: Secondary | ICD-10-CM | POA: Diagnosis not present

## 2023-11-24 DIAGNOSIS — Z9889 Other specified postprocedural states: Secondary | ICD-10-CM

## 2023-11-24 DIAGNOSIS — M47812 Spondylosis without myelopathy or radiculopathy, cervical region: Secondary | ICD-10-CM

## 2023-11-24 DIAGNOSIS — M5412 Radiculopathy, cervical region: Secondary | ICD-10-CM

## 2023-11-24 DIAGNOSIS — M4802 Spinal stenosis, cervical region: Secondary | ICD-10-CM | POA: Diagnosis not present

## 2023-11-24 NOTE — Progress Notes (Signed)
 Because this visit was a virtual/telehealth visit,  certain criteria was not obtained, such a blood pressure, CBG if applicable, and timed get up and go. Any medications not marked as taking were not mentioned during the medication reconciliation part of the visit. Any vitals not documented were not able to be obtained due to this being a telehealth visit or patient was unable to self-report a recent blood pressure reading due to a lack of equipment at home via telehealth. Vitals that have been documented are verbally provided by the patient.   This visit was performed by a medical professional under my direct supervision. I was immediately available for consultation/collaboration. I have reviewed and agree with the Annual Wellness Visit documentation.  Subjective:   Monica Lindsey is a 78 y.o. who presents for a Medicare Wellness preventive visit.  As a reminder, Annual Wellness Visits don't include a physical exam, and some assessments may be limited, especially if this visit is performed virtually. We may recommend an in-person follow-up visit with your provider if needed.  Visit Complete: Virtual I connected with  Monica Lindsey on 11/24/23 by a audio enabled telemedicine application and verified that I am speaking with the correct person using two identifiers.  Patient Location: Home  Provider Location: Home Office  I discussed the limitations of evaluation and management by telemedicine. The patient expressed understanding and agreed to proceed.  Vital Signs: Because this visit was a virtual/telehealth visit, some criteria may be missing or patient reported. Any vitals not documented were not able to be obtained and vitals that have been documented are patient reported.  VideoDeclined- This patient declined Librarian, academic. Therefore the visit was completed with audio only.  Persons Participating in Visit: Patient.  AWV Questionnaire: No: Patient  Medicare AWV questionnaire was not completed prior to this visit.  Cardiac Risk Factors include: advanced age (>41men, >102 women);hypertension;Other (see comment);dyslipidemia, Risk factor comments: afib     Objective:    Today's Vitals   11/09/23 1147  BP: 130/82  Weight: 162 lb (73.5 kg)  Height: 5' 2.5 (1.588 m)  PainSc: 5    Body mass index is 29.16 kg/m.     11/12/2023    4:38 PM 11/09/2023   11:45 AM 07/23/2023    3:31 PM 01/25/2023   11:35 AM 08/04/2022    3:08 PM 04/08/2022    7:20 AM 03/04/2022    6:57 AM  Advanced Directives  Does Patient Have a Medical Advance Directive? Yes Yes No Yes Yes No Yes  Type of Estate agent of [place];Living will Healthcare Power of [place];Living will  Healthcare Power of [place];Living will Healthcare Power of [place];Living will  Healthcare Power of [place];Living will  Does patient want to make changes to medical advance directive?  No - Patient declined  Yes (MAU/Ambulatory/Procedural Areas - Information given)     Copy of Healthcare Power of Attorney in Chart?  No - copy requested  No - copy requested No - copy requested    Would patient like information on creating a medical advance directive?      No - Patient declined     Current Medications (verified) Outpatient Encounter Medications as of 11/09/2023  Medication Sig   amLODipine  (NORVASC ) 5 MG tablet Take 1 tablet (5 mg total) by mouth daily.   apixaban  (ELIQUIS ) 5 MG TABS tablet Take 1 tablet (5 mg total) by mouth 2 (two) times daily.   cetirizine  (ZYRTEC ) 10 MG tablet Take 1 tablet (  10 mg total) by mouth daily. (Patient taking differently: Take 10 mg by mouth daily as needed for allergies or rhinitis.)   Cyanocobalamin  (VITAMIN B-12 PO) Place 1 tablet under the tongue daily.   fluticasone  (FLONASE ) 50 MCG/ACT nasal spray Place 2 sprays into both nostrils daily. (Patient taking differently: Place 2 sprays into both nostrils daily as needed for allergies or  rhinitis.)   hydrochlorothiazide  (HYDRODIURIL ) 25 MG tablet TAKE 1 TABLET BY MOUTH DAILY   metoprolol  tartrate (LOPRESSOR ) 25 MG tablet Take 0.5tablets (12.5 mg total) by mouth 2 (two) times daily.   Multiple Vitamin (MULTIVITAMIN) tablet Take 1 tablet by mouth daily.   Omega-3 Fatty Acids (FISH OIL) 1000 MG CAPS Take 1,000 mg by mouth daily.   polyethylene glycol (MIRALAX  / GLYCOLAX ) 17 g packet Take 17 g by mouth daily. (Patient taking differently: Take 17 g by mouth daily as needed for mild constipation or moderate constipation.)   pravastatin  (PRAVACHOL ) 80 MG tablet Take 1 tablet (80 mg total) by mouth every evening.   Vitamin D , Cholecalciferol, 25 MCG (1000 UT) TABS Take 1,000 Units by mouth daily.   No facility-administered encounter medications on file as of 11/09/2023.    Allergies (verified) Demerol [meperidine]   History: Past Medical History:  Diagnosis Date   Arthritis    Constipation    Frequent headaches    Hay fever    Heel spur    Hyperlipidemia    Hypertension    Persistent atrial fibrillation (HCC)    SOB (shortness of breath) 01/25/2023   Past Surgical History:  Procedure Laterality Date   ABDOMINAL HYSTERECTOMY     parialt ovaries intact   BACK SURGERY  02/02/1981   CARDIOVERSION N/A 03/04/2022   Procedure: CARDIOVERSION;  Surgeon: Darliss Rogue, MD;  Location: [facility] ORS;  Service: Cardiovascular;  Laterality: N/A;   CARDIOVERSION N/A 04/08/2022   Procedure: CARDIOVERSION;  Surgeon: Darliss Rogue, MD;  Location: [facility] OR[facility];  Service: Cardiovascular;  Laterality: N/A;   CARDIOVERSION N/A 01/25/2023   Procedure: CARDIOVERSION;  Surgeon: Perla Evalene PARAS, MD;  Location: [facility] ORS;  Service: Cardiovascular;  Laterality: N/A;   CARPOMETACARPAL (CMC) FUSION OF THUMB Right 04/07/2017   Procedure: CARPOMETACARPAL (CMC) FUSION OF THUMB;  Surgeon: Cleotilde Barrio, MD;  Location: [facility] ORS;  Service: Orthopedics;  Laterality: Right;   COLONOSCOPY     COLONOSCOPY  WITH PROPOFOL  N/A 09/10/2020   Procedure: COLONOSCOPY WITH PROPOFOL ;  Surgeon: Jinny Carmine, MD;  Location: [facility];  Service: Endoscopy;  Laterality: N/A;   HEMORRHOID SURGERY     TOOTH EXTRACTION     Family History  Problem Relation Age of Onset   Heart disease Mother    Alcohol abuse Father    Arthritis Father    Heart disease Father    Stroke Father    Hypertension Father    Brain cancer Sister    Breast cancer Paternal Aunt 19   Breast cancer Cousin    Social History   Socioeconomic History   Marital status: Widowed    Spouse name: Not on file   Number of children: 2   Years of education: Not on file   Highest education level: Not on file  Occupational History   Not on file  Tobacco Use   Smoking status: Never    Passive exposure: Past   Smokeless tobacco: Never  Vaping Use   Vaping status: Never Used  Substance and Sexual Activity   Alcohol use: Not Currently   Drug use: No  Sexual activity: Never  Other Topics Concern   Not on file  Social History Narrative   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  Very close to her 2 children and 2 grand children.Daughter, Clotilda, is HPOA.  Has a living will.Would desire CPR, would not desire prolonged life support if futile.   2 living chiildren    Social Drivers of Health   Financial Resource Strain: Low Risk  (08/04/2022)   Overall Financial Resource Strain (CARDIA)    Difficulty of Paying Living Expenses: Not hard at all  Food Insecurity: No Food Insecurity (11/09/2023)   Hunger Vital Sign    Worried About Running Out of Food in the Last Year: Never true    Ran Out of Food in the Last Year: Never true  Transportation Needs: No Transportation Needs (11/09/2023)   PRAPARE - Administrator, Civil Service (Medical): No    Lack of Transportation (Non-Medical): No  Physical Activity: Insufficiently Active (11/09/2023)   Exercise Vital Sign    Days of  Exercise per Week: 3 days    Minutes of Exercise per Session: 10 min  Stress: No Stress Concern Present (11/09/2023)   [employer] of Occupational Health - Occupational Stress Questionnaire    Feeling of Stress: Not at all  Social Connections: Moderately Integrated (11/09/2023)   Social Connection and Isolation Panel    Frequency of Communication with Friends and Family: More than three times a week    Frequency of Social Gatherings with Friends and Family: Once a week    Attends Religious Services: More than 4 times per year    Active Member of [employer] or Organizations: Yes    Attends Banker Meetings: More than 4 times per year    Marital Status: Widowed    Tobacco Counseling Counseling given: Not Answered    Clinical Intake:  Pre-visit preparation completed: Yes  Pain : 0-10 Pain Score: 5  Pain Type: Acute pain Pain Location: Shoulder Pain Orientation: Left Pain Descriptors / Indicators: Aching Pain Onset: Today Pain Frequency: Intermittent     BMI - recorded: 29.16 Nutritional Status: BMI 25 -29 Overweight Nutritional Risks: None Diabetes: No  Lab Results  Component Value Date   HGBA1C 5.7 10/07/2023   HGBA1C 5.6 02/13/2019     How often do you need to have someone help you when you read instructions, pamphlets, or other written materials from your doctor or pharmacy?: 1 - Never  Interpreter Needed?: No  Information entered by :: Tacha Manni,cma   Activities of Daily Living    Patient Care Team: Wendee Lynwood HERO, NP as PCP - General (Pain Medicine) Darliss Rogue, MD as PCP - Cardiology (Cardiology) Cindie Ole DASEN, MD as PCP - Electrophysiology (Cardiology) Oh, Deward GRADE, MD (Inactive) as Physician Assistant (Internal Medicine) Charlott Charlie SAUNDERS, MD as Counselor (Dermatology)  I have updated your Care Teams any recent Medical Services you may have received from other providers in the past year.     Assessment:   This is  a routine wellness examination for [place].  Hearing/Vision screen Hearing Screening - Comments:: No difficulties Vision Screening - Comments:: No difficulties   Goals Addressed             This Visit's Progress    Patient Stated       To see great grandkids       Depression Screen     11/18/2023   11:18 AM  11/09/2023   11:57 AM 10/07/2023   10:11 AM 07/30/2023   10:55 AM 09/30/2022   11:43 AM 08/04/2022    3:14 PM 08/01/2021   11:33 AM  PHQ 2/9 Scores  PHQ - 2 Score 0 0 0 0 0 0 0  PHQ- 9 Score 4 0 4 1 1 3      Fall Risk     11/18/2023   11:18 AM 11/09/2023   11:52 AM 10/07/2023    9:38 AM 07/30/2023   10:56 AM 09/30/2022   11:44 AM  Fall Risk   Falls in the past year? 1 0 1 1 1   Number falls in past yr: 0 0 1 0 1  Injury with Fall? 1 0 1 1 0  Risk for fall due to : No Fall Risks No Fall Risks History of fall(s) History of fall(s);Other (Comment) Other (Comment)  Follow up Falls evaluation completed Falls evaluation completed Falls evaluation completed Falls evaluation completed Falls evaluation completed    MEDICARE RISK AT HOME:  Medicare Risk at Home Any stairs in or around the home?: No If so, are there any without handrails?: No Home free of loose throw rugs in walkways, pet beds, electrical cords, etc?: Yes Adequate lighting in your home to reduce risk of falls?: Yes Life alert?: No Use of a cane, walker or w/c?: No Grab bars in the bathroom?: Yes Shower chair or bench in shower?: Yes Elevated toilet seat or a handicapped toilet?: Yes  TIMED UP AND GO:  Was the test performed?  No  Cognitive Function: 6CIT completed    04/20/2018   11:38 AM 07/09/2016   11:15 AM  MMSE - Mini Mental State Exam  Orientation to time 5 5   Orientation to Place 5 5   Registration 3 3   Attention/ Calculation 5 0   Recall 3 3   Language- name 2 objects 2 0   Language- repeat 1 1  Language- follow 3 step command 3 3   Language- read & follow direction 1 0   Write a  sentence 1 0   Copy design 1 0   Total score 30 20      Data saved with a previous flowsheet row definition        11/09/2023   11:58 AM 08/04/2022    3:15 PM 08/01/2021   11:39 AM  6CIT Screen  What Year? 0 points 0 points 0 points  What month? 0 points 0 points 0 points  What time? 0 points 0 points 0 points  Count back from 20 0 points 0 points 0 points  Months in reverse 0 points 2 points 0 points  Repeat phrase 0 points 0 points 0 points  Total Score 0 points 2 points 0 points    Immunizations Immunization History  Administered Date(s) Administered   DTaP 10/27/2007   Fluad Quad(high Dose 65+) 10/05/2018, 10/27/2019, 03/14/2021, 11/26/2021   INFLUENZA, HIGH DOSE SEASONAL PF 03/22/2023, 10/07/2023   Influenza, Seasonal, Injecte, Preservative Fre 03/06/2009, 02/10/2011, 11/24/2011   Influenza,inj,Quad PF,6+ Mos 12/06/2013, 10/10/2014, 11/27/2015, 12/01/2016   Influenza,inj,quad, With Preservative 11/02/2017   PFIZER(Purple Top)SARS-COV-2 Vaccination 02/09/2019, 03/02/2019, 11/07/2019   PNEUMOCOCCAL CONJUGATE-20 03/22/2023   Pfizer Covid-19 Vaccine Bivalent Booster 56yrs & up 08/28/2020   Pneumococcal Conjugate-13 05/17/2013   Pneumococcal-Unspecified 03/06/2009   Tdap 10/27/2007   Zoster Recombinant(Shingrix ) 10/20/2018, 10/20/2018   Zoster, Live 10/27/2007    Screening Tests Health Maintenance  Topic Date Due   DTaP/Tdap/Td (3 - Td or Tdap)  10/26/2017   Zoster Vaccines- Shingrix  (2 of 2) 12/15/2018   COVID-19 Vaccine (5 - 2025-26 season) 10/04/2023   Medicare Annual Wellness (AWV)  11/08/2024   Pneumococcal Vaccine: 50+ Years  Completed   Influenza Vaccine  Completed   DEXA SCAN  Completed   Meningococcal B Vaccine  Aged Out   Mammogram  Discontinued   Colonoscopy  Discontinued   Hepatitis C Screening  Discontinued    Health Maintenance Items Addressed:patient declined  Additional Screening:  Vision Screening: Recommended annual ophthalmology exams for  early detection of glaucoma and other disorders of the eye. Is the patient up to date with their annual eye exam?  No    Dental Screening: Recommended annual dental exams for proper oral hygiene  Community Resource Referral / Chronic Care Management: CRR required this visit?  No   CCM required this visit?  No   Plan:    I have personally reviewed and noted the following in the patient's chart:   Medical and social history Use of alcohol, tobacco or illicit drugs  Current medications and supplements including opioid prescriptions. Patient is not currently taking opioid prescriptions. Functional ability and status Nutritional status Physical activity Advanced directives List of other physicians Hospitalizations, surgeries, and ER visits in previous 12 months Vitals Screenings to include cognitive, depression, and falls Referrals and appointments  In addition, I have reviewed and discussed with patient certain preventive protocols, quality metrics, and best practice recommendations. A written personalized care plan for preventive services as well as general preventive health recommendations were provided to patient.   Lyle MARLA Right, [place]   11/24/2023   After Visit Summary: (MyChart) Due to this being a telephonic visit, the after visit summary with patients personalized plan was offered to patient via MyChart   Notes: Nothing significant to report at this time.

## 2023-11-24 NOTE — Patient Instructions (Signed)
 [name],  Thank you for taking the time for your Medicare Wellness Visit. I appreciate your continued commitment to your health goals. Please review the care plan we discussed, and feel free to reach out if I can assist you further.  Medicare recommends these wellness visits once per year to help you and your care team stay ahead of potential health issues. These visits are designed to focus on prevention, allowing your provider to concentrate on managing your acute and chronic conditions during your regular appointments.  Please note that Annual Wellness Visits do not include a physical exam. Some assessments may be limited, especially if the visit was conducted virtually. If needed, we may recommend a separate in-person follow-up with your provider.  Ongoing Care Seeing your primary care provider every 3 to 6 months helps us  monitor your health and provide consistent, personalized care.   Referrals If a referral was made during today's visit and you haven't received any updates within two weeks, please contact the referred provider directly to check on the status.  Recommended Screenings:  Health Maintenance  Topic Date Due   DTaP/Tdap/Td vaccine (3 - Td or Tdap) 10/26/2017   Zoster (Shingles) Vaccine (2 of 2) 12/15/2018   COVID-19 Vaccine (5 - 2025-26 season) 10/04/2023   Medicare Annual Wellness Visit  11/08/2024   Pneumococcal Vaccine for age over 43  Completed   Flu Shot  Completed   DEXA scan (bone density measurement)  Completed   Meningitis B Vaccine  Aged Out   Breast Cancer Screening  Discontinued   Colon Cancer Screening  Discontinued   Hepatitis C Screening  Discontinued       11/12/2023    4:38 PM  Advanced Directives  Does Patient Have a Medical Advance Directive? Yes  Type of Estate agent of [place];Living will   Advance Care Planning is important because it: Ensures you receive medical care that aligns with your values, goals, and  preferences. Provides guidance to your family and loved ones, reducing the emotional burden of decision-making during critical moments.  Vision: Annual vision screenings are recommended for early detection of glaucoma, cataracts, and diabetic retinopathy. These exams can also reveal signs of chronic conditions such as diabetes and high blood pressure.  Dental: Annual dental screenings help detect early signs of oral cancer, gum disease, and other conditions linked to overall health, including heart disease and diabetes.  Please see the attached documents for additional preventive care recommendations.

## 2023-11-24 NOTE — Patient Instructions (Signed)
 It was so nice to see you today. Thank you so much for coming in.    CT of your neck showed wear and tear (arthritis) along with some irritation of the nerves on right and left.   I want to get an MRI of your neck and your lower back to look into things further. We will get this approved through your insurance and [facility] will call you to schedule the appointment. Ask about your patient responsibility. You do not need to pay this prior to getting MRI, they can bill you.     [facility] is located at [address] in [place]. This is near the intersection of [address] and [address].   After you have the MRI scans, it can take 14-28 days for me to get the results back. If I don't have them in 2 weeks, we will call to try to get the results.   Once I have the results, we will call you to schedule a follow up visit with me to review them.   Please do not hesitate to call if you have any questions or concerns. You can also message me in MyChart.   Glade Boys PA-C [phone]     The physicians and staff at [facility] are committed to providing excellent care. You may receive a survey asking for feedback about your experience at our office. We value you your feedback and appreciate you taking the time to to fill it out. The [facility] leadership team is also available to discuss your experience in person, feel free to contact us  [phone].

## 2023-12-20 NOTE — Progress Notes (Deleted)
  Electrophysiology Office Follow up Visit Note:    Date:  12/20/2023   ID:  Monica Lindsey, DOB 1945/07/06, MRN [id number]  PCP:  Wendee Lynwood HERO, NP  [facility] Cardiologist:  Redell Cave, MD  [facility] Electrophysiologist:  OLE ONEIDA HOLTS, MD    Interval History:     Monica Lindsey is a 78 y.o. female who presents for a follow up visit.   I last saw the patient March 17, 2023.  She has persistent atrial fibrillation and flutter.  At the last appointment we discussed pursuing a watchful waiting strategy for her atrial fibrillation.  We had previously discussed catheter ablation and antiarrhythmic drugs.  She saw Dr. Cave November 15, 2023.  She is maintaining sinus rhythm at that appointment.      Past medical, surgical, social and family history were reviewed.  ROS:   Please see the history of present illness.    All other systems reviewed and are negative.  EKGs/Labs/Other Studies Reviewed:    The following studies were reviewed today:          Physical Exam:    VS:  There were no vitals taken for this visit.    Wt Readings from Last 3 Encounters:  11/24/23 168 lb 3.2 oz (76.3 kg)  11/18/23 165 lb 3.2 oz (74.9 kg)  11/15/23 164 lb 9.6 oz (74.7 kg)     GEN: no distress CARD: RRR, No MRG RESP: No IWOB. CTAB.      ASSESSMENT:    No diagnosis found. PLAN:    In order of problems listed above:  #Atrial fibrillation and flutter Symptomatic and has required cardioversion in the past Off amiodarone  at this time She would like to continue with a watchful waiting approach which I think is reasonable at this time Continue Eliquis  for stroke prophylaxis  #Hypertension *** goal today.  Recommend checking blood pressures 1-2 times per week at home and recording the values.  Recommend bringing these recordings to the primary care physician.  I discussed my upcoming departure from [facility] during today's clinic appointment.  She  will continue to follow-up with one of my partners moving forward.  Follow-up 1 year with EP APP.  Signed, Ole Holts, MD, [facility], [facility] 12/20/2023 2:34 PM    Electrophysiology [facility]

## 2023-12-21 ENCOUNTER — Ambulatory Visit
Admission: RE | Admit: 2023-12-21 | Discharge: 2023-12-21 | Disposition: A | Discharge status: Home / Self Care | Source: Ambulatory Visit | Attending: Nurse Practitioner | Admitting: Nurse Practitioner

## 2023-12-21 DIAGNOSIS — Z1231 Encounter for screening mammogram for malignant neoplasm of breast: Secondary | ICD-10-CM | POA: Diagnosis present

## 2023-12-22 ENCOUNTER — Ambulatory Visit: Admitting: Cardiology

## 2023-12-28 NOTE — Progress Notes (Unsigned)
  Electrophysiology Office Follow up Visit Note:    Date:  12/29/2023   ID:  Monica Lindsey, DOB 09-09-1945, MRN [id number]  PCP:  Wendee Lynwood HERO, NP  [facility] Cardiologist:  Redell Cave, MD  [facility] Electrophysiologist:  OLE ONEIDA HOLTS, MD    Interval History:     Monica Lindsey is a 78 y.o. female who presents for a follow up visit.   I last saw the patient March 17, 2023.  She has persistent atrial fibrillation and flutter.  At the last appointment we discussed pursuing a watchful waiting strategy for her atrial fibrillation.  We had previously discussed catheter ablation and antiarrhythmic drugs.  She saw Dr. Cave November 15, 2023.  She is maintaining sinus rhythm at that appointment.  Today she is doing well from a heart rhythm perspective.      Past medical, surgical, social and family history were reviewed.  ROS:   Please see the history of present illness.    All other systems reviewed and are negative.  EKGs/Labs/Other Studies Reviewed:    The following studies were reviewed today:          Physical Exam:    VS:  BP 115/80 (BP Location: Left Arm, Patient Position: Sitting, Cuff Size: Normal)   Pulse 69 Comment: 78 oximeter  Ht 5' 4 (1.626 m)   Wt 168 lb 12.8 oz (76.6 kg)   SpO2 98%   BMI 28.97 kg/m     Wt Readings from Last 3 Encounters:  12/29/23 168 lb 12.8 oz (76.6 kg)  11/24/23 168 lb 3.2 oz (76.3 kg)  11/18/23 165 lb 3.2 oz (74.9 kg)     GEN: no distress CARD: RRR, No MRG RESP: No IWOB. CTAB.      ASSESSMENT:    1. Persistent atrial fibrillation (HCC)   2. Primary hypertension    PLAN:    In order of problems listed above:  #Atrial fibrillation and flutter Symptomatic and has required cardioversion in the past Off amiodarone  at this time She would like to continue with a watchful waiting approach which I think is reasonable at this time Continue Eliquis  for stroke  prophylaxis  #Hypertension At goal today.  Recommend checking blood pressures 1-2 times per week at home and recording the values.  Recommend bringing these recordings to the primary care physician.  I discussed my upcoming departure from [facility] during today's clinic appointment.  She will continue to follow-up with one of my partners moving forward.  Follow-up 1 year with EP APP.  Signed, Ole Holts, MD, [facility], [facility] 12/29/2023 8:30 AM    Electrophysiology [facility]

## 2023-12-29 ENCOUNTER — Ambulatory Visit: Attending: Cardiology | Admitting: Cardiology

## 2023-12-29 ENCOUNTER — Encounter: Payer: Self-pay | Admitting: Cardiology

## 2023-12-29 VITALS — BP 115/80 | HR 69 | Ht 64.0 in | Wt 168.8 lb

## 2023-12-29 DIAGNOSIS — I4819 Other persistent atrial fibrillation: Secondary | ICD-10-CM | POA: Diagnosis not present

## 2023-12-29 DIAGNOSIS — I10 Essential (primary) hypertension: Secondary | ICD-10-CM | POA: Diagnosis not present

## 2023-12-29 NOTE — Patient Instructions (Signed)

## 2024-02-09 ENCOUNTER — Telehealth: Payer: Self-pay | Admitting: Nurse Practitioner

## 2024-02-09 ENCOUNTER — Ambulatory Visit (INDEPENDENT_AMBULATORY_CARE_PROVIDER_SITE_OTHER): Admitting: Nurse Practitioner

## 2024-02-09 VITALS — BP 110/72 | HR 60 | Temp 97.9°F | Ht 64.0 in | Wt 168.2 lb

## 2024-02-09 DIAGNOSIS — M5416 Radiculopathy, lumbar region: Secondary | ICD-10-CM | POA: Diagnosis not present

## 2024-02-09 DIAGNOSIS — M5412 Radiculopathy, cervical region: Secondary | ICD-10-CM

## 2024-02-09 DIAGNOSIS — K5909 Other constipation: Secondary | ICD-10-CM | POA: Diagnosis not present

## 2024-02-09 DIAGNOSIS — M542 Cervicalgia: Secondary | ICD-10-CM | POA: Diagnosis not present

## 2024-02-09 DIAGNOSIS — R0789 Other chest pain: Secondary | ICD-10-CM | POA: Diagnosis not present

## 2024-02-09 MED ORDER — METHOCARBAMOL 500 MG PO TABS: 500.0000 mg | ORAL_TABLET | Freq: Two times a day (BID) | ORAL | 0 refills | Status: AC | PRN

## 2024-02-09 NOTE — Progress Notes (Signed)
 "  Established Patient Office Visit  Subjective   Patient ID: RAYVN RICKERSON, female    DOB: 12-May-1945  Age: 79 y.o. MRN: [id number]  Chief Complaint  Patient presents with   Follow-up    Pt complains of having back and neck pain sometimes. States that she sleeps with neck pillow but still feels pressure/pain.     HPI  Discussed the use of AI scribe software for clinical note transcription with the patient, who gave verbal consent to proceed.  History of Present Illness JAMILYA SARRAZIN is an 79 year old female with cervical spinal stenosis who presents with neck and back pain.  She experiences ongoing neck and back pain, described as an 'annoyance' rather than severe pain. The pain is intermittent with a stabbing sensation that can catch her off guard. She also has numbness and a burning or freezing sensation in her left hand, described as feeling 'on fire or freezing.' Additionally, she reports headaches and occasional shooting pains behind her breast, which she associates with her neck issues.  In June, she visited the emergency room and underwent a CT scan of her neck; the report at that time described disc space narrowing at C3-C4 and C5-C6, bone spurs at C3-C4, spinal canal stenosis, and foraminal narrowing, particularly on the right side. She has not yet completed an MRI of her neck and back..  She has a history of hip pain and has previously received a cortisone injection, which provided relief. Her hip pain is starting to return.  She describes a history of urge incontinence, which has resolved after taking a large pill she cannot recall the name of, suspecting it might have been methocarbamol , prescribed in October for muscle relaxation.  She has chronic constipation, managed with Miralax  without significant relief. She finds magnesium citrate effective but is concerned about its frequent use. She is interested in seeing a gastroenterologist for further evaluation.  No  weakness in her arm or leg, no shortness of breath, fever, or chills.     Review of Systems  Constitutional:  Negative for chills and fever.  Neurological:  Positive for tingling and headaches. Negative for weakness.      Objective:     BP 110/72   Pulse 60   Temp 97.9 F (36.6 C) (Oral)   Ht 5' 4 (1.626 m)   Wt 168 lb 3.2 oz (76.3 kg)   SpO2 98%   BMI 28.87 kg/m  BP Readings from Last 3 Encounters:  02/09/24 110/72  12/29/23 115/80  11/24/23 116/76   Wt Readings from Last 3 Encounters:  02/09/24 168 lb 3.2 oz (76.3 kg)  12/29/23 168 lb 12.8 oz (76.6 kg)  11/24/23 168 lb 3.2 oz (76.3 kg)   SpO2 Readings from Last 3 Encounters:  02/09/24 98%  12/29/23 98%  11/18/23 98%      Physical Exam Vitals and nursing note reviewed.  Constitutional:      Appearance: Normal appearance.  Cardiovascular:     Rate and Rhythm: Normal rate and regular rhythm.     Heart sounds: Normal heart sounds.  Pulmonary:     Effort: Pulmonary effort is normal.     Breath sounds: Normal breath sounds.  Chest:     Chest wall: No tenderness.  Musculoskeletal:     Cervical back: Tenderness and crepitus present. No bony tenderness.     Lumbar back: Tenderness present.  Neurological:     Mental Status: She is alert.     Deep Tendon Reflexes:  Reflex Scores:      Bicep reflexes are 2+ on the right side and 2+ on the left side.      Patellar reflexes are 1+ on the right side and 1+ on the left side.    Comments: Bilateral upper and lower extremity strength 5/5      No results found for any visits on 02/09/24.    The ASCVD Risk score (Arnett DK, et al., 2019) failed to calculate for the following reasons:   The 2019 ASCVD risk score is only valid for ages 20 to 68   * - Cholesterol units were assumed    Assessment & Plan:   Problem List Items Addressed This Visit   None Visit Diagnoses       Cervical radiculopathy    -  Primary   Relevant Medications   methocarbamol   (ROBAXIN ) 500 MG tablet     Neck pain       Relevant Medications   methocarbamol  (ROBAXIN ) 500 MG tablet     Lumbar radiculopathy       Relevant Medications   methocarbamol  (ROBAXIN ) 500 MG tablet     Atypical chest pain       Relevant Orders   EKG 12-Lead     Chronic constipation       Relevant Orders   Ambulatory referral to Gastroenterology     Assessment and Plan Assessment & Plan Cervical radiculopathy Chronic cervical radiculopathy with disc space narrowing at C3-4 and C5-6, bone spur at C3-4, and foraminal narrowing on the right side. Symptoms include stabbing pain, numbness, and tingling in the left arm, with occasional chest discomfort. MRI of the neck and back was ordered but not completed. Symptoms are constant but not severe, with occasional exacerbations. - Refilled methocarbamol  for muscle relaxation, cautioned about potential dizziness and sleepiness. - Will contact neurosurgery to follow up on MRI scheduling.  Chronic right hip pain Chronic right hip pain with previous orthopedic consultation and corticosteroid injection. Pain has recurred, but she is managing with exercises and considering further intervention if symptoms worsen. - Recommended exercises for hip pain management. - Advised follow-up with orthopedist for potential corticosteroid injection if pain worsens.  Chronic constipation Ongoing for years, with ineffective response to Miralax . Symptoms include hard, round stools and reliance on magnesium citrate for relief. She expresses frustration with the condition and is interested in further evaluation. - Referred to gastroenterologist for further evaluation and management of chronic constipation.  Atypical chest pain -Patient with cardiac history inclusive of atrial fibrillation.  Low likelihood of cardiac etiology -Perform EKG in office -EKG patient's baseline with no acute abnormalities noted   Return if symptoms worsen or fail to improve, for As  scheduled .    Adina Crandall, NP  "

## 2024-02-09 NOTE — Telephone Encounter (Signed)
 LMOM for patient to return call. I need to know if her insurance has changed this year before a message is sent for the images to be scheduled.

## 2024-02-09 NOTE — Patient Instructions (Signed)
 Nice to see you today The EKG looked good.  Follow up with me as scheduled I will reach out to stacy and one of us  will get in touch with you

## 2024-02-09 NOTE — Telephone Encounter (Signed)
 Looks like her MRI's were approved back in October and never scheduled.   Please contact her about getting cervical and lumbar MRI done.

## 2024-02-09 NOTE — Telephone Encounter (Signed)
 Glade,  I saw Monica Lindsey in office for neck and back pain. I see that you had ordered and MR if the C and L spine. She mentions that she has not heard anything about the imaging studies yet. I told her I would reach out to you.  Thanks for your help  Adina BROCKS

## 2024-02-10 NOTE — Telephone Encounter (Signed)
 Left message to call back to discuss, also called her daughter Derral per DPR On file, left detailed message-need to know if patient wants to get MRI lumbar and cervical done still and make sure insurance is the same. If she wants to get these done still No Shara is needed with [facility] Medicare and she can call [facility] [place] scheduling to get this set up [phone]

## 2024-02-11 NOTE — Telephone Encounter (Signed)
 FYI-have not been able to get in touch with the patient or her daughter so far

## 2024-04-05 ENCOUNTER — Ambulatory Visit: Admitting: Nurse Practitioner
